# Patient Record
Sex: Female | Born: 1955 | Race: Black or African American | Hispanic: No | Smoking: Former smoker
Health system: Southern US, Community
[De-identification: ages and names within clinical notes are randomized; demographics above are authoritative.]

## PROBLEM LIST (undated history)

## (undated) DIAGNOSIS — E739 Lactose intolerance, unspecified: Secondary | ICD-10-CM

## (undated) DIAGNOSIS — E039 Hypothyroidism, unspecified: Secondary | ICD-10-CM

## (undated) DIAGNOSIS — M542 Cervicalgia: Secondary | ICD-10-CM

## (undated) DIAGNOSIS — J45909 Unspecified asthma, uncomplicated: Secondary | ICD-10-CM

## (undated) DIAGNOSIS — G4710 Hypersomnia, unspecified: Secondary | ICD-10-CM

## (undated) DIAGNOSIS — J309 Allergic rhinitis, unspecified: Secondary | ICD-10-CM

## (undated) DIAGNOSIS — E538 Deficiency of other specified B group vitamins: Secondary | ICD-10-CM

## (undated) DIAGNOSIS — K219 Gastro-esophageal reflux disease without esophagitis: Secondary | ICD-10-CM

## (undated) DIAGNOSIS — F329 Major depressive disorder, single episode, unspecified: Secondary | ICD-10-CM

## (undated) DIAGNOSIS — T7840XA Allergy, unspecified, initial encounter: Secondary | ICD-10-CM

## (undated) DIAGNOSIS — M2550 Pain in unspecified joint: Secondary | ICD-10-CM

## (undated) DIAGNOSIS — E119 Type 2 diabetes mellitus without complications: Secondary | ICD-10-CM

## (undated) DIAGNOSIS — M1990 Unspecified osteoarthritis, unspecified site: Secondary | ICD-10-CM

## (undated) DIAGNOSIS — E669 Obesity, unspecified: Secondary | ICD-10-CM

## (undated) DIAGNOSIS — M7062 Trochanteric bursitis, left hip: Secondary | ICD-10-CM

## (undated) DIAGNOSIS — C50919 Malignant neoplasm of unspecified site of unspecified female breast: Secondary | ICD-10-CM

## (undated) DIAGNOSIS — G4730 Sleep apnea, unspecified: Secondary | ICD-10-CM

## (undated) DIAGNOSIS — I10 Essential (primary) hypertension: Secondary | ICD-10-CM

## (undated) DIAGNOSIS — G709 Myoneural disorder, unspecified: Secondary | ICD-10-CM

## (undated) DIAGNOSIS — D649 Anemia, unspecified: Secondary | ICD-10-CM

## (undated) DIAGNOSIS — E559 Vitamin D deficiency, unspecified: Secondary | ICD-10-CM

## (undated) DIAGNOSIS — E785 Hyperlipidemia, unspecified: Secondary | ICD-10-CM

## (undated) DIAGNOSIS — D126 Benign neoplasm of colon, unspecified: Secondary | ICD-10-CM

## (undated) DIAGNOSIS — F411 Generalized anxiety disorder: Secondary | ICD-10-CM

## (undated) DIAGNOSIS — J984 Other disorders of lung: Secondary | ICD-10-CM

## (undated) DIAGNOSIS — K5900 Constipation, unspecified: Secondary | ICD-10-CM

## (undated) HISTORY — DX: Anemia, unspecified: D64.9

## (undated) HISTORY — DX: Allergy, unspecified, initial encounter: T78.40XA

## (undated) HISTORY — DX: Generalized anxiety disorder: F41.1

## (undated) HISTORY — DX: Other disorders of lung: J98.4

## (undated) HISTORY — DX: Unspecified osteoarthritis, unspecified site: M19.90

## (undated) HISTORY — DX: Deficiency of other specified B group vitamins: E53.8

## (undated) HISTORY — DX: Hyperlipidemia, unspecified: E78.5

## (undated) HISTORY — DX: Essential (primary) hypertension: I10

## (undated) HISTORY — DX: Lactose intolerance, unspecified: E73.9

## (undated) HISTORY — DX: Pain in unspecified joint: M25.50

## (undated) HISTORY — DX: Gastro-esophageal reflux disease without esophagitis: K21.9

## (undated) HISTORY — DX: Sleep apnea, unspecified: G47.30

## (undated) HISTORY — PX: ABDOMINAL HYSTERECTOMY: SHX81

## (undated) HISTORY — DX: Vitamin D deficiency, unspecified: E55.9

## (undated) HISTORY — DX: Hypersomnia, unspecified: G47.10

## (undated) HISTORY — DX: Cervicalgia: M54.2

## (undated) HISTORY — PX: WISDOM TOOTH EXTRACTION: SHX21

## (undated) HISTORY — DX: Obesity, unspecified: E66.9

## (undated) HISTORY — DX: Benign neoplasm of colon, unspecified: D12.6

## (undated) HISTORY — DX: Constipation, unspecified: K59.00

## (undated) HISTORY — DX: Major depressive disorder, single episode, unspecified: F32.9

## (undated) HISTORY — DX: Trochanteric bursitis, left hip: M70.62

## (undated) HISTORY — PX: BILATERAL OOPHORECTOMY: SHX1221

## (undated) HISTORY — PX: TUBAL LIGATION: SHX77

## (undated) HISTORY — PX: POLYPECTOMY: SHX149

## (undated) HISTORY — DX: Allergic rhinitis, unspecified: J30.9

## (undated) HISTORY — DX: Unspecified asthma, uncomplicated: J45.909

## (undated) HISTORY — PX: COLONOSCOPY: SHX174

## (undated) HISTORY — DX: Type 2 diabetes mellitus without complications: E11.9

## (undated) HISTORY — PX: OTHER SURGICAL HISTORY: SHX169

## (undated) HISTORY — DX: Myoneural disorder, unspecified: G70.9

---

## 2000-12-25 ENCOUNTER — Encounter: Admission: RE | Admit: 2000-12-25 | Discharge: 2001-01-03 | Payer: Self-pay | Admitting: Family Medicine

## 2001-02-26 ENCOUNTER — Other Ambulatory Visit: Admission: RE | Admit: 2001-02-26 | Discharge: 2001-02-26 | Payer: Self-pay | Admitting: Family Medicine

## 2001-10-05 ENCOUNTER — Encounter: Admission: RE | Admit: 2001-10-05 | Discharge: 2001-10-05 | Payer: Self-pay | Admitting: Family Medicine

## 2001-10-05 ENCOUNTER — Encounter: Payer: Self-pay | Admitting: Family Medicine

## 2002-05-16 ENCOUNTER — Ambulatory Visit (HOSPITAL_BASED_OUTPATIENT_CLINIC_OR_DEPARTMENT_OTHER): Admission: RE | Admit: 2002-05-16 | Discharge: 2002-05-16 | Payer: Self-pay | Admitting: Orthopedic Surgery

## 2003-01-02 ENCOUNTER — Other Ambulatory Visit: Admission: RE | Admit: 2003-01-02 | Discharge: 2003-01-02 | Payer: Self-pay | Admitting: Endocrinology

## 2003-01-07 ENCOUNTER — Encounter: Payer: Self-pay | Admitting: Endocrinology

## 2004-02-12 ENCOUNTER — Other Ambulatory Visit: Admission: RE | Admit: 2004-02-12 | Discharge: 2004-02-12 | Payer: Self-pay | Admitting: Endocrinology

## 2004-09-14 ENCOUNTER — Ambulatory Visit: Payer: Self-pay | Admitting: Internal Medicine

## 2004-09-22 ENCOUNTER — Ambulatory Visit (HOSPITAL_COMMUNITY): Admission: RE | Admit: 2004-09-22 | Discharge: 2004-09-22 | Payer: Self-pay | Admitting: Internal Medicine

## 2005-01-17 ENCOUNTER — Ambulatory Visit: Payer: Self-pay | Admitting: Internal Medicine

## 2005-02-16 ENCOUNTER — Ambulatory Visit: Payer: Self-pay | Admitting: Internal Medicine

## 2005-02-21 ENCOUNTER — Ambulatory Visit: Payer: Self-pay | Admitting: Internal Medicine

## 2005-08-11 ENCOUNTER — Ambulatory Visit: Payer: Self-pay | Admitting: Internal Medicine

## 2006-03-06 ENCOUNTER — Ambulatory Visit (HOSPITAL_COMMUNITY): Admission: RE | Admit: 2006-03-06 | Discharge: 2006-03-06 | Payer: Self-pay | Admitting: Obstetrics and Gynecology

## 2006-04-19 ENCOUNTER — Encounter (INDEPENDENT_AMBULATORY_CARE_PROVIDER_SITE_OTHER): Payer: Self-pay | Admitting: Specialist

## 2006-04-19 ENCOUNTER — Inpatient Hospital Stay (HOSPITAL_COMMUNITY): Admission: RE | Admit: 2006-04-19 | Discharge: 2006-04-21 | Payer: Self-pay | Admitting: Obstetrics and Gynecology

## 2006-04-28 ENCOUNTER — Ambulatory Visit: Payer: Self-pay | Admitting: Internal Medicine

## 2006-06-22 ENCOUNTER — Ambulatory Visit: Payer: Self-pay | Admitting: Internal Medicine

## 2006-06-22 LAB — CONVERTED CEMR LAB
ALT: 21 units/L (ref 0–40)
AST: 23 units/L (ref 0–37)
Albumin: 3.4 g/dL — ABNORMAL LOW (ref 3.5–5.2)
Alkaline Phosphatase: 56 units/L (ref 39–117)
BUN: 7 mg/dL (ref 6–23)
Bilirubin Urine: NEGATIVE
Calcium: 9.2 mg/dL (ref 8.4–10.5)
Chloride: 108 meq/L (ref 96–112)
Chol/HDL Ratio, serum: 4.5
Creatinine, Ser: 0.9 mg/dL (ref 0.4–1.2)
Glomerular Filtration Rate, Af Am: 86 mL/min/{1.73_m2}
Glucose, Bld: 102 mg/dL — ABNORMAL HIGH (ref 70–99)
Ketones, ur: NEGATIVE mg/dL
Lymphocytes Relative: 47.6 % — ABNORMAL HIGH (ref 12.0–46.0)
MCV: 75.4 fL — ABNORMAL LOW (ref 78.0–100.0)
Monocytes Relative: 8.1 % (ref 3.0–11.0)
Nitrite: NEGATIVE
Platelets: 227 10*3/uL (ref 150–400)
Potassium: 4.1 meq/L (ref 3.5–5.1)
Specific Gravity, Urine: >=1.03 (ref 1.000–1.03)
Total Protein: 6.9 g/dL (ref 6.0–8.3)
Triglyceride fasting, serum: 59 mg/dL (ref 0–149)
VLDL: 12 mg/dL (ref 0–40)

## 2006-07-05 ENCOUNTER — Encounter: Admission: RE | Admit: 2006-07-05 | Discharge: 2006-07-05 | Payer: Self-pay | Admitting: Obstetrics and Gynecology

## 2006-08-08 HISTORY — PX: KNEE ARTHROSCOPY: SUR90

## 2006-12-07 ENCOUNTER — Ambulatory Visit: Payer: Self-pay | Admitting: Internal Medicine

## 2007-02-07 ENCOUNTER — Ambulatory Visit (HOSPITAL_BASED_OUTPATIENT_CLINIC_OR_DEPARTMENT_OTHER): Admission: RE | Admit: 2007-02-07 | Discharge: 2007-02-07 | Payer: Self-pay | Admitting: Orthopedic Surgery

## 2007-04-03 ENCOUNTER — Encounter: Payer: Self-pay | Admitting: Endocrinology

## 2007-04-03 DIAGNOSIS — F411 Generalized anxiety disorder: Secondary | ICD-10-CM

## 2007-04-03 DIAGNOSIS — F3289 Other specified depressive episodes: Secondary | ICD-10-CM

## 2007-04-03 DIAGNOSIS — F329 Major depressive disorder, single episode, unspecified: Secondary | ICD-10-CM

## 2007-04-03 DIAGNOSIS — E785 Hyperlipidemia, unspecified: Secondary | ICD-10-CM

## 2007-04-03 HISTORY — DX: Generalized anxiety disorder: F41.1

## 2007-04-03 HISTORY — DX: Hyperlipidemia, unspecified: E78.5

## 2007-04-03 HISTORY — DX: Other specified depressive episodes: F32.89

## 2007-04-03 HISTORY — DX: Major depressive disorder, single episode, unspecified: F32.9

## 2007-08-21 ENCOUNTER — Ambulatory Visit: Payer: Self-pay | Admitting: Internal Medicine

## 2007-08-21 DIAGNOSIS — J45909 Unspecified asthma, uncomplicated: Secondary | ICD-10-CM

## 2007-08-21 DIAGNOSIS — E119 Type 2 diabetes mellitus without complications: Secondary | ICD-10-CM | POA: Insufficient documentation

## 2007-08-21 DIAGNOSIS — J309 Allergic rhinitis, unspecified: Secondary | ICD-10-CM | POA: Insufficient documentation

## 2007-08-21 DIAGNOSIS — E739 Lactose intolerance, unspecified: Secondary | ICD-10-CM

## 2007-08-21 DIAGNOSIS — D649 Anemia, unspecified: Secondary | ICD-10-CM

## 2007-08-21 HISTORY — DX: Allergic rhinitis, unspecified: J30.9

## 2007-08-21 HISTORY — DX: Unspecified asthma, uncomplicated: J45.909

## 2007-08-21 HISTORY — DX: Lactose intolerance, unspecified: E73.9

## 2007-08-21 HISTORY — DX: Anemia, unspecified: D64.9

## 2007-10-05 ENCOUNTER — Encounter: Payer: Self-pay | Admitting: Internal Medicine

## 2008-04-03 ENCOUNTER — Ambulatory Visit: Payer: Self-pay | Admitting: Internal Medicine

## 2008-04-03 DIAGNOSIS — R5383 Other fatigue: Secondary | ICD-10-CM

## 2008-04-07 LAB — CONVERTED CEMR LAB
Albumin: 3.5 g/dL (ref 3.5–5.2)
Alkaline Phosphatase: 49 units/L (ref 39–117)
BUN: 11 mg/dL (ref 6–23)
Basophils Absolute: 0 10*3/uL (ref 0.0–0.1)
Basophils Relative: 0.5 % (ref 0.0–3.0)
CO2: 28 meq/L (ref 19–32)
Cholesterol: 180 mg/dL (ref 0–200)
Creatinine, Ser: 0.9 mg/dL (ref 0.4–1.2)
GFR calc Af Amer: 85 mL/min
GFR calc non Af Amer: 70 mL/min
Glucose, Bld: 101 mg/dL — ABNORMAL HIGH (ref 70–99)
HCT: 34.8 % — ABNORMAL LOW (ref 36.0–46.0)
MCV: 74.2 fL — ABNORMAL LOW (ref 78.0–100.0)
Monocytes Absolute: 0.6 10*3/uL (ref 0.1–1.0)
Monocytes Relative: 6.8 % (ref 3.0–12.0)
Potassium: 3.7 meq/L (ref 3.5–5.1)
RBC: 4.69 M/uL (ref 3.87–5.11)
RDW: 13.8 % (ref 11.5–14.6)
Total Bilirubin: 0.5 mg/dL (ref 0.3–1.2)
VLDL: 26 mg/dL (ref 0–40)

## 2008-04-18 ENCOUNTER — Encounter: Payer: Self-pay | Admitting: Internal Medicine

## 2008-06-20 ENCOUNTER — Telehealth (INDEPENDENT_AMBULATORY_CARE_PROVIDER_SITE_OTHER): Payer: Self-pay | Admitting: *Deleted

## 2008-10-27 ENCOUNTER — Ambulatory Visit: Payer: Self-pay | Admitting: Internal Medicine

## 2008-10-27 DIAGNOSIS — N390 Urinary tract infection, site not specified: Secondary | ICD-10-CM

## 2008-10-27 DIAGNOSIS — I10 Essential (primary) hypertension: Secondary | ICD-10-CM | POA: Insufficient documentation

## 2008-10-27 LAB — CONVERTED CEMR LAB
Bilirubin Urine: NEGATIVE
Ketones, urine, test strip: NEGATIVE
Nitrite: NEGATIVE
pH: 5

## 2008-11-26 ENCOUNTER — Ambulatory Visit: Payer: Self-pay | Admitting: Internal Medicine

## 2008-12-05 ENCOUNTER — Ambulatory Visit: Payer: Self-pay | Admitting: Pulmonary Disease

## 2008-12-05 DIAGNOSIS — G4730 Sleep apnea, unspecified: Secondary | ICD-10-CM

## 2008-12-05 DIAGNOSIS — G4710 Hypersomnia, unspecified: Secondary | ICD-10-CM

## 2008-12-05 HISTORY — DX: Hypersomnia, unspecified: G47.10

## 2008-12-23 ENCOUNTER — Telehealth (INDEPENDENT_AMBULATORY_CARE_PROVIDER_SITE_OTHER): Payer: Self-pay | Admitting: *Deleted

## 2008-12-24 ENCOUNTER — Ambulatory Visit (HOSPITAL_BASED_OUTPATIENT_CLINIC_OR_DEPARTMENT_OTHER): Admission: RE | Admit: 2008-12-24 | Discharge: 2008-12-24 | Payer: Self-pay | Admitting: Pulmonary Disease

## 2008-12-24 ENCOUNTER — Encounter: Payer: Self-pay | Admitting: Pulmonary Disease

## 2008-12-30 ENCOUNTER — Ambulatory Visit: Payer: Self-pay | Admitting: Pulmonary Disease

## 2009-01-06 ENCOUNTER — Encounter: Payer: Self-pay | Admitting: Pulmonary Disease

## 2009-01-09 ENCOUNTER — Ambulatory Visit: Payer: Self-pay | Admitting: Pulmonary Disease

## 2009-01-22 ENCOUNTER — Encounter: Admission: RE | Admit: 2009-01-22 | Discharge: 2009-01-22 | Payer: Self-pay | Admitting: Pulmonary Disease

## 2009-03-04 ENCOUNTER — Ambulatory Visit: Payer: Self-pay | Admitting: Internal Medicine

## 2009-03-04 DIAGNOSIS — R1319 Other dysphagia: Secondary | ICD-10-CM

## 2009-03-04 LAB — CONVERTED CEMR LAB
ALT: 20 units/L (ref 0–35)
AST: 24 units/L (ref 0–37)
Albumin: 3.8 g/dL (ref 3.5–5.2)
BUN: 13 mg/dL (ref 6–23)
Bilirubin Urine: NEGATIVE
Chloride: 109 meq/L (ref 96–112)
Cholesterol: 171 mg/dL (ref 0–200)
Hemoglobin: 11.9 g/dL — ABNORMAL LOW (ref 12.0–15.0)
Hgb A1c MFr Bld: 5.8 % (ref 4.6–6.5)
LDL Cholesterol: 123 mg/dL — ABNORMAL HIGH (ref 0–99)
Leukocytes, UA: NEGATIVE
Lymphs Abs: 2.9 10*3/uL (ref 0.7–4.0)
MCHC: 31.7 g/dL (ref 30.0–36.0)
MCV: 74.7 fL — ABNORMAL LOW (ref 78.0–100.0)
Neutrophils Relative %: 36.4 % — ABNORMAL LOW (ref 43.0–77.0)
Nitrite: NEGATIVE
Potassium: 4.1 meq/L (ref 3.5–5.1)
RDW: 13.9 % (ref 11.5–14.6)
Sodium: 143 meq/L (ref 135–145)
TSH: 2.73 microintl units/mL (ref 0.35–5.50)
Total CHOL/HDL Ratio: 5
Total Protein, Urine: NEGATIVE mg/dL
Triglycerides: 60 mg/dL (ref 0.0–149.0)
VLDL: 12 mg/dL (ref 0.0–40.0)

## 2009-03-06 LAB — CONVERTED CEMR LAB: Vit D, 25-Hydroxy: 13 ng/mL — ABNORMAL LOW (ref 30–89)

## 2009-03-18 ENCOUNTER — Encounter: Payer: Self-pay | Admitting: Internal Medicine

## 2009-03-19 ENCOUNTER — Encounter: Payer: Self-pay | Admitting: Internal Medicine

## 2009-06-26 ENCOUNTER — Ambulatory Visit (HOSPITAL_COMMUNITY): Admission: RE | Admit: 2009-06-26 | Discharge: 2009-06-28 | Payer: Self-pay | Admitting: Orthopedic Surgery

## 2010-03-11 ENCOUNTER — Telehealth: Payer: Self-pay | Admitting: Internal Medicine

## 2010-04-05 ENCOUNTER — Inpatient Hospital Stay (HOSPITAL_COMMUNITY): Admission: EM | Admit: 2010-04-05 | Discharge: 2010-04-06 | Payer: Self-pay | Admitting: Emergency Medicine

## 2010-04-07 ENCOUNTER — Ambulatory Visit: Payer: Self-pay | Admitting: Internal Medicine

## 2010-04-07 DIAGNOSIS — R1011 Right upper quadrant pain: Secondary | ICD-10-CM

## 2010-04-07 DIAGNOSIS — K5289 Other specified noninfective gastroenteritis and colitis: Secondary | ICD-10-CM

## 2010-04-07 DIAGNOSIS — J984 Other disorders of lung: Secondary | ICD-10-CM

## 2010-04-07 HISTORY — DX: Other disorders of lung: J98.4

## 2010-04-17 ENCOUNTER — Emergency Department (HOSPITAL_COMMUNITY): Admission: EM | Admit: 2010-04-17 | Discharge: 2010-04-17 | Payer: Self-pay | Admitting: Emergency Medicine

## 2010-05-07 ENCOUNTER — Ambulatory Visit (HOSPITAL_COMMUNITY): Admission: RE | Admit: 2010-05-07 | Discharge: 2010-05-07 | Payer: Self-pay | Admitting: Internal Medicine

## 2010-05-18 ENCOUNTER — Telehealth: Payer: Self-pay | Admitting: Internal Medicine

## 2010-08-29 ENCOUNTER — Encounter: Payer: Self-pay | Admitting: Neurology
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
## 2010-09-07 NOTE — Progress Notes (Signed)
Summary: Rx refill req  Phone Note Call from Patient Call back at Work Phone [phone]   Caller: Patient Summary of Call: Pt called requesting refill of Naproxen and Flexeril for Arthritis back pain.  [facility] [address] Initial call taken by: Margaret Pyle, CMA,  May 18, 2010 2:57 PM  Follow-up for Phone Call        ok - to robin to handle Follow-up by: Corwin Levins MD,  May 18, 2010 5:19 PM  Additional Follow-up for Phone Call Additional follow up Details #1::        I can do but did not see flexeril on med. list? Additional Follow-up by: Robin Ewing CMA Duncan Dull),  May 19, 2010 9:00 AM    Additional Follow-up for Phone Call Additional follow up Details #2::    ok for flexeril 5 three times a day as needed, #90, x 1 ref Follow-up by: Corwin Levins MD,  May 19, 2010 9:41 AM  New/Updated Medications: FLEXERIL 5 MG TABS (CYCLOBENZAPRINE HCL) 1 by mouth three times a day Prescriptions: FLEXERIL 5 MG TABS (CYCLOBENZAPRINE HCL) 1 by mouth three times a day  #90 x 1   Entered by:   Scharlene Gloss CMA (AAMA)   Authorized by:   Corwin Levins MD   Signed by:   Scharlene Gloss CMA (AAMA) on 05/19/2010   Method used:   [facility]ed to ...       [facility] [phone][facility] (retail)       [address]       [place], [place]  [id number]       Ph: [phone] or [phone]       Fax: [phone]   RxID:   [id number] NAPROSYN 500 MG TABS (NAPROXEN) Take 1 tablet by mouth twice a day as needed pain  #60 x 1   Entered by:   Scharlene Gloss CMA (AAMA)   Authorized by:   Corwin Levins MD   Signed by:   Scharlene Gloss CMA (AAMA) on 05/19/2010   Method used:   [facility]ed to ...       [facility] [phone][facility] ([facility]ail)       [address]       [place], [place]  [id number]       Ph: [phone] or [phone]       Fax: [phone]   RxID:   [id number]

## 2010-09-07 NOTE — Progress Notes (Signed)
Summary: Medicatioon Refill  Phone Note Refill Request Message from:  Fax from Pharmacy on March 11, 2010 9:19 AM  Refills Requested: Medication #1:  ALPRAZOLAM 0.25 MG TABS 1 by mouth two times a day as needed   Dosage confirmed as above?Dosage Confirmed   Last Refilled: 03/04/2009   Notes[facility] [address], [phone] Initial call taken by: Robin Ewing CMA Duncan Dull),  March 11, 2010 9:19 AM    New/Updated Medications: ALPRAZOLAM 0.25 MG TABS (ALPRAZOLAM) 1 by mouth two times a day as needed Prescriptions: ALPRAZOLAM 0.25 MG TABS (ALPRAZOLAM) 1 by mouth two times a day as needed  #60 x 2   Entered and Authorized by:   Corwin Levins MD   Signed by:   Corwin Levins MD on 03/11/2010   Method used:   Print then Give to Patient   RxID:   [id number]  done hardcopy to LIM side B - dahlia Corwin Levins MD  March 11, 2010 12:40 PM   Rx faxed to pharmacy Margaret Pyle, CMA  March 11, 2010 1:06 PM

## 2010-10-03 ENCOUNTER — Emergency Department (HOSPITAL_COMMUNITY)
Admission: EM | Admit: 2010-10-03 | Discharge: 2010-10-03 | Disposition: A | Discharge status: Home / Self Care | Payer: 59 | Attending: Emergency Medicine | Admitting: Emergency Medicine

## 2010-10-03 DIAGNOSIS — R51 Headache: Secondary | ICD-10-CM | POA: Insufficient documentation

## 2010-10-03 DIAGNOSIS — J329 Chronic sinusitis, unspecified: Secondary | ICD-10-CM | POA: Insufficient documentation

## 2010-10-06 ENCOUNTER — Encounter: Payer: Self-pay | Admitting: Internal Medicine

## 2010-10-07 ENCOUNTER — Other Ambulatory Visit: Payer: Self-pay | Admitting: Internal Medicine

## 2010-10-07 DIAGNOSIS — R911 Solitary pulmonary nodule: Secondary | ICD-10-CM

## 2010-10-11 ENCOUNTER — Ambulatory Visit (INDEPENDENT_AMBULATORY_CARE_PROVIDER_SITE_OTHER)
Admission: RE | Admit: 2010-10-11 | Discharge: 2010-10-11 | Disposition: A | Discharge status: Home / Self Care | Payer: 59 | Source: Ambulatory Visit | Attending: Internal Medicine | Admitting: Internal Medicine

## 2010-10-11 DIAGNOSIS — R911 Solitary pulmonary nodule: Secondary | ICD-10-CM

## 2010-10-11 DIAGNOSIS — J984 Other disorders of lung: Secondary | ICD-10-CM

## 2010-10-14 NOTE — Miscellaneous (Signed)
Summary: Orders Update   Clinical Lists Changes  Orders: Added new Referral order of Radiology Referral (Radiology) - Signed 

## 2010-10-21 LAB — URINALYSIS, ROUTINE W REFLEX MICROSCOPIC
Bilirubin Urine: NEGATIVE
Glucose, UA: NEGATIVE mg/dL
Hgb urine dipstick: NEGATIVE
Ketones, ur: NEGATIVE mg/dL
Nitrite: NEGATIVE
Protein, ur: NEGATIVE mg/dL
Specific Gravity, Urine: 1.019 (ref 1.005–1.030)
Urobilinogen, UA: 0.2 mg/dL (ref 0.0–1.0)
pH: 5 (ref 5.0–8.0)

## 2010-10-22 LAB — CK TOTAL AND CKMB (NOT AT ARMC)
Relative Index: 1 (ref 0.0–2.5)
Total CK: 239 U/L — ABNORMAL HIGH (ref 7–177)

## 2010-10-22 LAB — COMPREHENSIVE METABOLIC PANEL
ALT: 16 U/L (ref 0–35)
AST: 18 U/L (ref 0–37)
Albumin: 3.6 g/dL (ref 3.5–5.2)
CO2: 28 mEq/L (ref 19–32)
CO2: 29 mEq/L (ref 19–32)
Calcium: 8.7 mg/dL (ref 8.4–10.5)
Chloride: 107 mEq/L (ref 96–112)
GFR calc Af Amer: >60 mL/min (ref >60)
GFR calc Af Amer: >60 mL/min (ref >60)
GFR calc non Af Amer: 56 mL/min — ABNORMAL LOW (ref >60)
GFR calc non Af Amer: >60 mL/min (ref >60)
Glucose, Bld: 118 mg/dL — ABNORMAL HIGH (ref 70–99)
Potassium: 3.9 mEq/L (ref 3.5–5.1)
Sodium: 138 mEq/L (ref 135–145)
Sodium: 141 mEq/L (ref 135–145)
Total Bilirubin: 0.5 mg/dL (ref 0.3–1.2)

## 2010-10-22 LAB — LIPASE, BLOOD: Lipase: 29 U/L (ref 11–59)

## 2010-10-22 LAB — POCT CARDIAC MARKERS
CKMB, poc: 1.2 ng/mL (ref 1.0–8.0)
CKMB, poc: 1.4 ng/mL (ref 1.0–8.0)
Myoglobin, poc: 61.7 ng/mL (ref 12–200)
Troponin i, poc: <0.05 ng/mL (ref 0.00–0.09)

## 2010-10-22 LAB — URINALYSIS, ROUTINE W REFLEX MICROSCOPIC
Bilirubin Urine: NEGATIVE
Glucose, UA: NEGATIVE mg/dL
Nitrite: NEGATIVE
Protein, ur: NEGATIVE mg/dL

## 2010-10-22 LAB — TROPONIN I: Troponin I: 0.01 ng/mL (ref 0.00–0.06)

## 2010-10-22 LAB — DIFFERENTIAL
Basophils Relative: 1 % (ref 0–1)
Monocytes Absolute: 0.5 10*3/uL (ref 0.1–1.0)
Monocytes Relative: 8 % (ref 3–12)

## 2010-10-22 LAB — CBC
Hemoglobin: 10.6 g/dL — ABNORMAL LOW (ref 12.0–15.0)
Hemoglobin: 11 g/dL — ABNORMAL LOW (ref 12.0–15.0)
MCHC: 31.9 g/dL (ref 30.0–36.0)
Platelets: 190 10*3/uL (ref 150–400)
RBC: 4.46 MIL/uL (ref 3.87–5.11)
RBC: 4.62 MIL/uL (ref 3.87–5.11)

## 2010-11-10 LAB — PROTIME-INR
INR: 1.11 (ref 0.00–1.49)
Prothrombin Time: 14.2 seconds (ref 11.6–15.2)

## 2010-11-10 LAB — DIFFERENTIAL
Basophils Absolute: 0 10*3/uL (ref 0.0–0.1)
Basophils Relative: 0 % (ref 0–1)
Monocytes Relative: 5 % (ref 3–12)
Neutro Abs: 1.8 10*3/uL (ref 1.7–7.7)
Neutrophils Relative %: 28 % — ABNORMAL LOW (ref 43–77)

## 2010-11-10 LAB — APTT: aPTT: 25 seconds (ref 24–37)

## 2010-11-10 LAB — CBC
HCT: 35.7 % — ABNORMAL LOW (ref 36.0–46.0)
Hemoglobin: 11.4 g/dL — ABNORMAL LOW (ref 12.0–15.0)
RDW: 14.4 % (ref 11.5–15.5)

## 2010-11-10 LAB — COMPREHENSIVE METABOLIC PANEL
Alkaline Phosphatase: 46 U/L (ref 39–117)
BUN: 12 mg/dL (ref 6–23)
CO2: 29 mEq/L (ref 19–32)
GFR calc non Af Amer: >60 mL/min (ref >60)
Glucose, Bld: 83 mg/dL (ref 70–99)
Potassium: 3.9 mEq/L (ref 3.5–5.1)
Total Bilirubin: 0.7 mg/dL (ref 0.3–1.2)
Total Protein: 6.9 g/dL (ref 6.0–8.3)

## 2010-11-10 LAB — URINALYSIS, ROUTINE W REFLEX MICROSCOPIC
Bilirubin Urine: NEGATIVE
Glucose, UA: NEGATIVE mg/dL
Ketones, ur: NEGATIVE mg/dL
pH: 5 (ref 5.0–8.0)

## 2010-12-01 ENCOUNTER — Emergency Department (HOSPITAL_COMMUNITY): Payer: 59

## 2010-12-01 ENCOUNTER — Emergency Department (HOSPITAL_COMMUNITY)
Admission: EM | Admit: 2010-12-01 | Discharge: 2010-12-02 | Disposition: A | Discharge status: Home / Self Care | Payer: 59 | Attending: General Surgery | Admitting: General Surgery

## 2010-12-01 DIAGNOSIS — M549 Dorsalgia, unspecified: Secondary | ICD-10-CM | POA: Insufficient documentation

## 2010-12-01 DIAGNOSIS — S41109A Unspecified open wound of unspecified upper arm, initial encounter: Secondary | ICD-10-CM | POA: Insufficient documentation

## 2010-12-01 DIAGNOSIS — S21209A Unspecified open wound of unspecified back wall of thorax without penetration into thoracic cavity, initial encounter: Secondary | ICD-10-CM | POA: Insufficient documentation

## 2010-12-01 DIAGNOSIS — K219 Gastro-esophageal reflux disease without esophagitis: Secondary | ICD-10-CM | POA: Insufficient documentation

## 2010-12-01 DIAGNOSIS — R112 Nausea with vomiting, unspecified: Secondary | ICD-10-CM | POA: Insufficient documentation

## 2010-12-01 DIAGNOSIS — S21109A Unspecified open wound of unspecified front wall of thorax without penetration into thoracic cavity, initial encounter: Secondary | ICD-10-CM | POA: Insufficient documentation

## 2010-12-01 DIAGNOSIS — S01409A Unspecified open wound of unspecified cheek and temporomandibular area, initial encounter: Secondary | ICD-10-CM | POA: Insufficient documentation

## 2010-12-01 DIAGNOSIS — G4730 Sleep apnea, unspecified: Secondary | ICD-10-CM | POA: Insufficient documentation

## 2010-12-01 DIAGNOSIS — Y92009 Unspecified place in unspecified non-institutional (private) residence as the place of occurrence of the external cause: Secondary | ICD-10-CM | POA: Insufficient documentation

## 2010-12-01 DIAGNOSIS — S1190XA Unspecified open wound of unspecified part of neck, initial encounter: Secondary | ICD-10-CM | POA: Insufficient documentation

## 2010-12-01 DIAGNOSIS — S51809A Unspecified open wound of unspecified forearm, initial encounter: Secondary | ICD-10-CM | POA: Insufficient documentation

## 2010-12-01 DIAGNOSIS — R079 Chest pain, unspecified: Secondary | ICD-10-CM | POA: Insufficient documentation

## 2010-12-01 DIAGNOSIS — M542 Cervicalgia: Secondary | ICD-10-CM | POA: Insufficient documentation

## 2010-12-01 LAB — TYPE AND SCREEN: Unit division: 0

## 2010-12-01 LAB — POCT I-STAT, CHEM 8
BUN: 17 mg/dL (ref 6–23)
Creatinine, Ser: 1.1 mg/dL (ref 0.4–1.2)
Hemoglobin: 11.6 g/dL — ABNORMAL LOW (ref 12.0–15.0)
Potassium: 3.7 meq/L (ref 3.5–5.1)
Sodium: 137 meq/L (ref 135–145)

## 2010-12-01 LAB — ABO/RH: ABO/RH(D): O POS

## 2010-12-01 MED ORDER — IOHEXOL 300 MG/ML  SOLN
100.0000 mL | Freq: Once | INTRAMUSCULAR | Status: AC | PRN
  Administered 2010-12-01: 100 mL via INTRAVENOUS
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
## 2010-12-21 NOTE — Op Note (Signed)
NAME:  Elizabeth Hawkins, Elizabeth Hawkins          ACCOUNT NO.:  [id number]   MEDICAL RECORD NO.:  [id number]          PATIENT TYPE:  AMB   LOCATION:  [facility]                          FACILITY:  [facility]   PHYSICIAN:  Harvie Junior, M.D.   DATE OF BIRTH:  01-24-1956   DATE OF PROCEDURE:  02/07/2007  DATE OF DISCHARGE:                               OPERATIVE REPORT   PREOPERATIVE DIAGNOSIS:  Medial meniscal tear.   POSTOPERATIVE DIAGNOSES:  1. Medial meniscal tear.  2. With chondromalacia, medial femoral condyle; and chondromalacia,      patellofemoral joint.   PROCEDURE PERFORMED:  1. Partial posterior horn medial meniscectomy with corresponding      debridement in the medial compartment.  2. Debridement of chondromalacia patella.   SURGEON:  Harvie Junior, M.D.   ASSISTANT SURGEON:  Marshia Ly, P.A.   ANESTHESIA:  General.   BRIEF HISTORY:  [name] is a 55 year old female with a history  of having had medial side knee pain.  She has had locking, catching.  We  talked about treatment options and ultimately felt the most appropriate  course of action for her was going to be arthroscopic medial  meniscectomy and she was brought to the operating room for this  procedure.   DESCRIPTION OF PROCEDURE:  The patient was brought to the operating room  and, after adequate anesthesia was obtained with a general anesthetic,  the patient was placed on the operating table and the left leg was  prepped and draped in the sterile fashion.  Following this, routine  arthroscopic examination of the knee revealed that there was an obvious  posterior horn medial meniscal tear.  This was debrided with straight  biting forceps, upbiting forceps, and the remaining meniscal rim was  contoured down with a Leksell shaver.   There was some chondromalacia in the medial compartment.  It was not  dramatic, but certainly grade II changes on the medial femoral condyle,  and grade I change on the medial tibial  plateau.  We turned to the ACL  which looked normal.  Turned to the lateral side which was normal.  The  patellofemoral joint showed some grade II chondromalacia and no dramatic  lateral tracking.  At this point the chondromalacia was debrided up in  the patellofemoral joint, especially in the lateral facet.  At this  point the knee was copiously and thoroughly irrigated  and suctioned dry.  The operative portals were closed with a bandage.  A  sterile compressive dressing was applied, and the patient was taken to  the recovery room and was noted to be in satisfactory condition.  Estimated blood loss for the procedure was none.      Harvie Junior, M.D.  Electronically Signed     JLG/MEDQ  D:  02/07/2007  T:  02/07/2007  Job:  [id number]

## 2010-12-21 NOTE — Procedures (Signed)
NAME:  Elizabeth Hawkins, Elizabeth Hawkins                ACCOUNT NO.:  [id number]   MEDICAL RECORD NO.:  [id number]          PATIENT TYPE:  OUT   LOCATION:  SLEEP CENTER                 FACILITY:  [facility]   PHYSICIAN:  Oretha Milch, MD      DATE OF BIRTH:  10-02-1955   DATE OF STUDY:                            NOCTURNAL POLYSOMNOGRAM   REFERRING PHYSICIAN:   INDICATION FOR STUDY:  Excessive daytime somnolence, fatigue,  nonrefreshing sleep, and loud snoring in this 55 year old woman with a  weight of 230 pounds, height 5 feet 4 inches, BMI 39, neck size 17  inches.   EPWORTH SLEEPINESS SCORE:  14.   This intervention polysomnogram was performed with a sleep technologist  in attendance.  EEG, EOG, EMG, EKG, and respiratory parameters were  recorded.  Sleep stages, arousals, limb movements, and respiratory data  were scored according to criteria laid out by the American Academy of  Sleep Medicine.   MEDICATIONS:   SLEEP ARCHITECTURE:  Lights out was at 10:46 p.m., lights on was at work  5:49 a.m.  CPAP was initiated at 1:00 a.m.  During the diagnostic  portion, total sleep time was 165 minutes with a sleep period time of  172 minutes and a sleep efficiency of 88%.  Sleep latency was 23 minutes  with a latency of REM sleep 157 minutes.  Sleep stages as a percentage  of total sleep time was N1 13%, N2 88%, N3 0.3%, and REM sleep 88.8%  (14.5 minutes).  She spent 105 minutes in the supine position during the  titration portion of REM sleep accounted for 59 minutes.  REM was noted  in 3 long periods during the night.   RESPIRATORY DATA:  During the diagnostic portion, there were 70  obstructive apneas, 0 central apneas, 0 mixed apneas, and 28 hypopnea  with apnea-hypopnea index of 16 events per hour.  The longest apnea was  18 seconds and longest hypopnea was 33 seconds and the lowest  desaturation was 78%.  Due to this degree of respiratory disturbance,  CPAP was initiated at 5 cm and titrated due to  hypopnea and snoring to a  final level of 13 cm at a level of 10 cm for 45.5 minutes including 6  hours of REM sleep.  No events were noted with a low desaturation of  92%.  Due to snoring, CPAP was titrated further at a final level of 13  cm for 24.5 minutes including 2.5 minutes of REM sleep.  No events were  noted with a low desaturation of 94%.  This appears to be optimal level  used during the study.   AROUSAL DATA:  The arousal index during the diagnostic portion was 18  events per hour.  Few arousals were noted at the higher levels of CPAP.   OXYGEN DATA:  The lowest desaturation during the diagnostic portion was  78%.  No desaturation was noted at the higher levels of the CPAP.   CARDIAC DATA:  No arrhythmias were noted.  The low heart rate was 47  beats per minute during non-REM sleep and the high heart rate was 111  beats per  minute.   MOVEMENT-PARASOMNIA:  No significant limb movements were noted.   DISCUSSION:  A medium Mirage Quattro mask was used for desensitization.  Supine REM sleep was noted during the titration portion.  Titration  appeared to be optimal.   IMPRESSIONS:  1. Moderate obstructive sleep apnea with hypopnea causing sleep      fragmentation and desaturation.  2. This was corrected by continuous positive airway pressure of 10 cm      but was titrated optimally at 13 cm to absence of snoring with the      medium full-face mask.  3. No evidence of cardiac arrhythmias, limb movements, or behavioral      disturbance during sleep.   RECOMMENDATIONS:  1. CPAP should be initiated at 10 cm with a goal of 13 cm with a      medium full-face mask.  2. Weight loss should be encouraged.  3. She should be asked to avoid medications with sedative side      effects.  She should be asked to avoid driving when sleepy.      Oretha Milch, MD  Electronically Signed     RVA/MEDQ  D:  12/30/2008 12:43:59  T:  12/31/2008 03:35:53  Job:  [id number]

## 2010-12-24 NOTE — Op Note (Signed)
NAME:  Elizabeth Hawkins, Elizabeth Hawkins          ACCOUNT NO.:  [id number]   MEDICAL RECORD NO.:  [id number]          PATIENT TYPE:  INP   LOCATION:  9311                          FACILITY:  [facility]   PHYSICIAN:  Duke Salvia. Marcelle Overlie, M.D.DATE OF BIRTH:  [birth year]   DATE OF PROCEDURE:  04/19/2006  DATE OF DISCHARGE:                                 OPERATIVE REPORT   PREOPERATIVE DIAGNOSIS:  Pelvic pain, symptomatic degenerating leiomyoma.  Umbilica hernia with omentum.   POSTOPERATIVE DIAGNOSIS:  Pelvic pain, symptomatic degenerating leiomyoma.  Umbilica hernia with omentum.   PROCEDURE:  Attempted open diagnostic laparoscopy followed by total  abdominal hysterectomy, bilateral salpingo-oophorectomy, lysis of adhesions.   SURGEON:  Duke Salvia. Marcelle Overlie, M.D.   ASSISTANT:  Zelphia Cairo, M.D.   ANESTHESIA:  General endotracheal.   DRAINS:  Foley catheter.   BLOOD LOSS:  150.   SPECIMENS REMOVED:  Bilateral tubes and ovaries and uterus.   PROCEDURE AND FINDINGS:  The patient taken to the operating room.  After an  adequate level of general endotracheal anesthesia was obtained with the  patient's legs in stirrups, the abdomen, perineum and vagina were prepped in  the usual manner for LAVH/BSO.  A Foley catheter was positioned.  A Hulka  tenaculum was positioned.  Attention directed to the subumbilical area which  was infiltrated with half percent Marcaine plain.  An incision was made,  carried down individually through fascia using deep retractors.  I could  never get directly into the peritoneum.  Because of concerns about poor  descent noted during her EUA and the presence of a periumbilical hernia and  difficulty getting in, decision made to proceed with TAH-BSO.  Pfannenstiel  incision was made through her old C-section scar, carried down to the fascia  which was incised and extended transversely.  Rectus muscles divided in the  midline.  Peritoneum entered superiorly without incident and  extended in a  vertical fashion.  O'Connor-O'Sullivan retractor was then positioned.  Palpation in the periumbilical area revealed a large umbilical hernia above  the umbilicus.  There was no omentum or bowel into that area, but there were  some periumbilical adhesions from the omentum.  The bowel was packed  superiorly out of the field.  The patient was placed in Trendelenburg.  Kelly clamps were then placed at each utero-ovarian pedicle starting on the  left.  The LigaSure was used to coagulate, cut and divide the left round  ligament.  Peritoneum carried around the midline.  The exact same repeated  on the opposite side, and the bladder was advanced below the cervix with  sharp and blunt dissection.  The left IP ligament was then clamped, divided,  doubly free tied with 0 Vicryl suture after carefully identifying the course  of the ureter well below.  The exact same repeated on the opposite side,  again after carefully identifying the ureter on the opposite side below our  surgical site.  The uterine vascular pedicles were then skeletonized.  After  assuring that the bladder was well below, the uterine vascular pedicles were  coagulated with LigaSure and divided.  This was carried down  close to the  uterus through the cardinal ligament.  Heaney clamps were then used to clamp  the cervical vaginal pedicles, and the specimen was excised.  These were  closed in Heaney fashion with 0 Vicryl sutures.  The remainder of the cuff  was closed with interrupted 2-0 Vicryl sutures.  Pelvis was irrigated with  saline and aspirated.  The operative site was noted to be hemostatic.  The  cecum and appendix were inspected and noted to be normal.  There was not  good enough exposure to the Pfannenstiel to fix the umbilical hernia which  will require general surgical follow-up if symptomatic.  Prior to closure,  sponge, needle and instrument counts were correct x2.  Perineum closed with  a running 2-0  Vicryl suture.  Fascia closed from laterally to midline on  either side with a 0 PDS suture.  The subfascial and subcutaneous On-Q  catheter was then positioned.  Subcutaneous fat was hemostatic.  Steri-  Strips were used on the skin.  She tolerated this well and went to recovery  room in good condition.  Clear urine noted at the end of the case.      Richard M. Marcelle Overlie, M.D.  Electronically Signed     RMH/MEDQ  D:  04/19/2006  T:  04/19/2006  Job:  [id number]

## 2010-12-24 NOTE — H&P (Signed)
Elizabeth Hawkins, Elizabeth Hawkins                ACCOUNT NO.:  [id number]   MEDICAL RECORD NO.:  [id number]          PATIENT TYPE:  AMB   LOCATION:                                [facility]  [facility]   PHYSICIAN:  Duke Salvia. Marcelle Overlie, M.D.    DATE OF BIRTH:   DATE OF ADMISSION:  04/19/2006  DATE OF DISCHARGE:                                HISTORY & PHYSICAL   CHIEF COMPLAINT:  Pelvic pain secondary to degenerating leiomyoma   HISTORY OF PRESENT ILLNESS:  A 55 year old G3, P1, prior tubal.  The patient  underwent endometrial ablation in 1992 with significant hypomenorrhea.  In  1999, she had some spotting.  Endometrial biopsy was performed, very minimal  tissue noted that returned weak proliferative activity, no other  abnormality, just a few fragments.  More recently, she has been bothered by  worsening pelvic pain in the last 6 months.  Ultrasound done recently here  in the office showed a 4.3 x 3.5 cm fibroid that appeared to have some  central cavitation consistent with degeneration and a small adnexal cyst.  Also, she had an abdominal and pelvic CT with contrast that showed a focal  posterior myometrial fibroid, right ovary normal, left ovary a small  functional cyst and what appeared to be possibly an omentum containing  umbilical hernia that has been asymptomatic.  She returns now for open  diagnostic laparoscopy with LVH-BSO.  This procedure including risk of  bleeding, infection, adjacent organ injury, possible need for open  additional surgery, transfusion risk, risk of infection, phlebitis, length  of expected recovery time all reviewed with her which she understands and  accepts.   PAST MEDICAL HISTORY:  Operations are tubal in 1991, endometrial ablation in  1999.  She has had one ectopic pregnancy treated by laparotomy and one  vaginal delivery in the past.  She did receive a transfusion at the time of  ectopic surgery.   FAMILY HISTORY:  Significant for mother and father with  hypertension, father  with hepatitis.  Mother also had MS.   PHYSICAL EXAMINATION:  VITAL SIGNS: Temperature 98.2, blood pressure 130/78.  HEENT:  Unremarkable.  NECK: Supple without masses.  LUNGS: Clear.  CARDIOVASCULAR: Regular rate and rhythm without murmurs, rubs, or gallops.  BREASTS: Without masses.  ABDOMEN:  Soft, flat, nontender.  PELVIC: Normal external genitalia.  Vagina clear.  Uterus upper limits of  normal size.  Adnexal unremarkable.  The fibroid was not readily palpable.  EXTREMITIES: Unremarkable.  NEUROLOGIC: Unremarkable.   IMPRESSION:  Pelvic pain secondary to degenerating fibroid.   PLAN:  Open diagnostic laparoscopy with laparoscopic abdominal hysterectomy  and bilateral salpingo-oophorectomy.  Procedure and risks reviewed as above.      Richard M. Marcelle Overlie, M.D.  Electronically Signed     RMH/MEDQ  D:  04/17/2006  T:  04/17/2006  Job:  [id number]

## 2010-12-24 NOTE — Discharge Summary (Signed)
NAME:  Elizabeth Hawkins, Elizabeth Hawkins          ACCOUNT NO.:  [id number]   MEDICAL RECORD NO.:  [id number]          PATIENT TYPE:  INP   LOCATION:  9311                          [facility]ACILITY:  [facility]   PHYSICIAN:  Duke Salvia. Marcelle Overlie, M.D.DATE OF BIRTH:  [birth year]   DATE OF ADMISSION:  04/19/2006  DATE OF DISCHARGE:  04/21/2006                                 DISCHARGE SUMMARY   DISCHARGE DIAGNOSES:  1. Symptomatic leiomyomata.  2. Attempted open diagnostic laparoscopy followed by total abdominal      hysterectomy bilateral salpingo-oophorectomy.  -BSO.  3. Asymptomatic umbilical hernia.   Summary of the history physical examination, please see admission HPI for  details.  Briefly, a 55 year old, G3, P1, who had a prior tubal and a prior  ablation in 1992, continues to have pelvic pain secondary to a degenerating  fibroid noted by ultrasound and also CT scan.  CT also shows an umbilical  hernia with a possible omentum adhering to that area.   HOSPITAL COURSE:  On April 19, 2006, under general anesthesia, the  patient underwent attempted open DL, could not get in, probably secondary to  some of the omental adhesions, therefore, a TAH-BSO was carried out without  complication.  She by palpation had a at 3-to-4-cm supraumbilical hernial  defect.  There was no bowel or omentum into that area but there was some  omentum adherent around that area.  Her first post-op day, she had good pain  control with her _On-Q catheters and oral pain meds.  Her Foley catheter was  removed.  She was ambulating without difficulty, afebrile, and tolerating a  regular diet.  By the a.m. of April 21, 2006, she was ambulating and was  ready for discharge.  At that point, the ___On -Q catheters were removed.  Clips were left in place.  She was ready for discharge.   VITAL SIGNS:  Afebrile.  BP slightly elevated at 150/90, which will need to  be followed up.   LABORATORY DATA:  C-MET on admission normal.  CBC:   Hemoglobin 12.6.  Blood  type O+.  Antibody screen was negative.  Her post-op CBC was 10.80 that was  on April 20, 2006.   DISPOSITION:  1. The patient was discharged on Tylox p.r.n. pain.  2. Will return to the office in 4 days to have the clips out.  3. Advised to report any incisional redness or drainage, increased pain or      bleeding, or fever over 101.  4. She was given specific instructions regarding diet, sex, exercise.   CONDITION:  Good.   ACTIVITY:  Graded increase.      Richard M. Marcelle Overlie, M.D.  Electronically Signed     RMH/MEDQ  D:  04/21/2006  T:  04/21/2006  Job:  [id number]

## 2010-12-24 NOTE — Op Note (Signed)
   NAME:  Elizabeth Hawkins, Elizabeth Hawkins                    ACCOUNT NO.:  [id number]   MEDICAL RECORD NO.:  [id number]                   PATIENT TYPE:  AMB   LOCATION:  DSC                                  FACILITY:  [facility]   PHYSICIAN:  Cindee Salt, MD                      DATE OF BIRTH:  1955-11-28   DATE OF PROCEDURE:  05/16/2002  DATE OF DISCHARGE:                                 OPERATIVE REPORT   PREOPERATIVE DIAGNOSIS:  Carpal tunnel syndrome right hand.   POSTOPERATIVE DIAGNOSIS:  Carpal tunnel syndrome right hand.   OPERATION:  Decompression right median nerve.   SURGEON:  Cindee Salt, M.D.   ASSISTANT:  R.N.   ANESTHESIA:  Forearm-based IV regional.   HISTORY:  The patient is a 55 year old female with history of carpal tunnel  syndrome and EMG nerve conductions positive which has not responded to  conservative treatment.   DESCRIPTION OF PROCEDURE:  The patient was brought to the operating room  where a forearm-based IV regional anesthetic was carried out without  difficulty.  She was prepped and draped using Betadine scrubbing solution  with the right arm free.  A longitudinal incision was made in the palm,  carried down through subcutaneous tissue, bleeders were electrocauterized,  the palmar fascia was split, superficial palmar artery was identified, the  flexor tendon to the ring and little finger identified to the ulnar side and  the median nerve, the carpal retinaculum was incised with sharp dissection.  A right angle and Sewell retractor were placed between the skin and forearm  fascia.  The fascia was released for approximately 3 cm proximal to the  wrist crease under direct vision.  The canal was explored.  No further  lesions were identified.  The wound was irrigated.  The skin was closed with  interrupted 5-0 nylon sutures.  Sterile compressive dressing and splint was  applied.  The patient tolerated the procedure well and was taken to the  recovery room for  observation in satisfactory condition.  She was discharged  home to return to the [facility] in 1 week on Vicodin and  Keflex.                                               Cindee Salt, MD    GK/MEDQ  D:  05/16/2002  T:  05/19/2002  Job:  [id number]

## 2011-05-12 ENCOUNTER — Other Ambulatory Visit: Payer: Self-pay | Admitting: Internal Medicine

## 2011-05-24 LAB — POCT HEMOGLOBIN-HEMACUE
Hemoglobin: 13
Operator id: 123881

## 2011-08-13 ENCOUNTER — Other Ambulatory Visit: Payer: Self-pay | Admitting: Internal Medicine

## 2011-12-16 ENCOUNTER — Ambulatory Visit (INDEPENDENT_AMBULATORY_CARE_PROVIDER_SITE_OTHER): Payer: 59 | Admitting: Family Medicine

## 2011-12-16 VITALS — BP 140/89 | HR 69 | Temp 97.9°F | Resp 18 | Ht 64.0 in | Wt 257.8 lb

## 2011-12-16 DIAGNOSIS — L299 Pruritus, unspecified: Secondary | ICD-10-CM

## 2011-12-16 DIAGNOSIS — W57XXXA Bitten or stung by nonvenomous insect and other nonvenomous arthropods, initial encounter: Secondary | ICD-10-CM

## 2011-12-16 MED ORDER — PREDNISONE 20 MG PO TABS: ORAL_TABLET | ORAL | Status: AC

## 2011-12-16 NOTE — Patient Instructions (Signed)
For itching may use your OTC anti- itch cream, and may also use a hydrocortisone cream.   For day time may use zyrtec or claritin.  Benadryl orally at night if needed.   If you are not feeling better or you are getting worse you may take your prednisone (oral steroid) prescription.

## 2011-12-16 NOTE — Progress Notes (Signed)
  Patient Name: Elizabeth Hawkins Date of Birth: 04-09-1956 Medical Record Number: [id number] Gender: female Date of Encounter: 12/16/2011  History of Present Illness:  Elizabeth Hawkins is a 56 y.o. very pleasant female patient who presents with the following:  Here today with an itchy rash which started just last night.  Is quite pruritic.  She tried some calamine type lotion.  Otherwise she feels ok currently- no SOB, no lip or tongue swelling.  Has not used any other medications yet.    No new foods, no new exposures recently.  She was at the [facility] last weekend and her friend broke out in an itchy rash.  She stayed at a friend's home.  However, she was feeling fine until yesterday.  Otherwise no exposures to new medications, foods, animals, etc.    Patient Active Problem List  Diagnoses  . GLUCOSE INTOLERANCE  . HYPERLIPIDEMIA  . ANEMIA-NOS  . ANXIETY  . DEPRESSION  . ESSENTIAL HYPERTENSION  . ALLERGIC RHINITIS  . ASTHMA  . PULMONARY NODULE, RIGHT LOWER LOBE  . COLITIS  . UTI  . HYPERSOMNIA, ASSOCIATED WITH SLEEP APNEA  . FATIGUE  . OTHER DYSPHAGIA  . RUQ PAIN   No past medical history on file. No past surgical history on file. History  Substance Use Topics  . Smoking status: Former Games developer  . Smokeless tobacco: Not on file  . Alcohol Use: Not on file   No family history on file. No Known Allergies  Medication list has been reviewed and updated.  Review of Systems: As per HPI- otherwise negative.   Physical Examination: Filed Vitals:   12/16/11 1013  BP: 140/89  Pulse: 69  Temp: 97.9 F (36.6 C)  TempSrc: Oral  Resp: 18  Height: 5\' 4"  (1.626 m)  Weight: 257 lb 12.8 oz (116.937 kg)    Body mass index is 44.25 kg/(m^2).  GEN: WDWN, NAD, Non-toxic, A & O x 3, obese HEENT: Atraumatic, Normocephalic. Neck supple. No masses, No LAD. No oral lesions, no angioedema Ears and Nose: No external deformity. CV: RRR, No M/G/R. No JVD. No thrill. No extra heart  sounds. PULM: CTA B, no wheezes, crackles, rhonchi. No retractions. No resp. distress. No accessory muscle use EXTR: No c/c/e NEURO Normal gait.  PSYCH: Normally interactive. Conversant. Not depressed or anxious appearing.  Calm demeanor.  There are a few (around 10) discrete lesions which appear to be consistent with insect bites on her body- mostly along her posterior left thigh and on the forearms.  No other rash, palms/ soles negative   Assessment and Plan: 1. Itching  predniSONE (DELTASONE) 20 MG tablet  2. Insect bites  predniSONE (DELTASONE) 20 MG tablet   Mild case of insect bites.  Gave Rx for prednisone as above, but encouraged her to try conservative measures such as benadryl cream and benadryl po, and hydrocortisone cream first.  If not able to tolerate itching may try prednisone taper of 40mg  for 3 days, 20mg  for 3 days.  However cautioned her that prednisone can increase blood sugar, among other issues.  Patient (or parent if minor) instructed to return to clinic or call if not better in 3-4 day(s).  Sooner if worse.

## 2012-05-01 ENCOUNTER — Ambulatory Visit (INDEPENDENT_AMBULATORY_CARE_PROVIDER_SITE_OTHER): Payer: 59 | Admitting: Internal Medicine

## 2012-05-01 ENCOUNTER — Encounter: Payer: Self-pay | Admitting: Internal Medicine

## 2012-05-01 ENCOUNTER — Ambulatory Visit (INDEPENDENT_AMBULATORY_CARE_PROVIDER_SITE_OTHER)
Admission: RE | Admit: 2012-05-01 | Discharge: 2012-05-01 | Disposition: A | Discharge status: Home / Self Care | Payer: 59 | Source: Ambulatory Visit | Attending: Internal Medicine | Admitting: Internal Medicine

## 2012-05-01 ENCOUNTER — Other Ambulatory Visit (INDEPENDENT_AMBULATORY_CARE_PROVIDER_SITE_OTHER): Payer: 59

## 2012-05-01 VITALS — BP 140/80 | HR 76 | Temp 98.3°F | Ht 64.0 in | Wt 266.2 lb

## 2012-05-01 DIAGNOSIS — R002 Palpitations: Secondary | ICD-10-CM

## 2012-05-01 DIAGNOSIS — E739 Lactose intolerance, unspecified: Secondary | ICD-10-CM

## 2012-05-01 DIAGNOSIS — Z86010 Personal history of colonic polyps: Secondary | ICD-10-CM | POA: Insufficient documentation

## 2012-05-01 DIAGNOSIS — Z0001 Encounter for general adult medical examination with abnormal findings: Secondary | ICD-10-CM | POA: Insufficient documentation

## 2012-05-01 DIAGNOSIS — D126 Benign neoplasm of colon, unspecified: Secondary | ICD-10-CM

## 2012-05-01 DIAGNOSIS — Z0000 Encounter for general adult medical examination without abnormal findings: Secondary | ICD-10-CM

## 2012-05-01 DIAGNOSIS — E669 Obesity, unspecified: Secondary | ICD-10-CM | POA: Insufficient documentation

## 2012-05-01 HISTORY — DX: Benign neoplasm of colon, unspecified: D12.6

## 2012-05-01 LAB — CBC WITH DIFFERENTIAL/PLATELET
Basophils Absolute: 0 10*3/uL (ref 0.0–0.1)
Eosinophils Relative: 2.3 % (ref 0.0–5.0)
Lymphocytes Relative: 52.1 % — ABNORMAL HIGH (ref 12.0–46.0)
Monocytes Relative: 8.4 % (ref 3.0–12.0)
Neutrophils Relative %: 36.5 % — ABNORMAL LOW (ref 43.0–77.0)
Platelets: 207 10*3/uL (ref 150.0–400.0)
RDW: 14.9 % — ABNORMAL HIGH (ref 11.5–14.6)
WBC: 6.7 10*3/uL (ref 4.5–10.5)

## 2012-05-01 LAB — URINALYSIS, ROUTINE W REFLEX MICROSCOPIC
Bilirubin Urine: NEGATIVE
Ketones, ur: NEGATIVE
Leukocytes, UA: NEGATIVE
Nitrite: NEGATIVE
Specific Gravity, Urine: 1.025 (ref 1.000–1.030)
Urobilinogen, UA: 0.2 (ref 0.0–1.0)
pH: 6 (ref 5.0–8.0)
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
## 2012-05-01 NOTE — Assessment & Plan Note (Signed)
stable overall by hx and exam, most recent data reviewed with pt, and pt to continue medical treatment as before Lab Results  Component Value Date   HGBA1C 5.8 03/04/2009

## 2012-05-01 NOTE — Patient Instructions (Addendum)
Your EKG was OK today Please remember to followup with your GYN for the yearly pap smear and/or mammogram - Dr Anders Simmonds will be contacted regarding the referral for: colonoscopy Please go to XRAY in the Basement for the x-ray test Please go to LAB in the Basement for the blood and/or urine tests to be done today You will be contacted by phone if any changes need to be made immediately.  Otherwise, you will receive a letter about your results with an explanation. Please remember to sign up for My Chart at your earliest convenience, as this will be important to you in the future with finding out test results. You will be contacted regarding the referral for: echocardiogram, and cardiology Please return in 6 months or sooner if needed

## 2012-05-01 NOTE — Assessment & Plan Note (Signed)

## 2012-05-01 NOTE — Assessment & Plan Note (Signed)
May 2009 with [facility], but pt requests change to [facility] - for GI referral, was due for colon f/u 2012

## 2012-05-01 NOTE — Assessment & Plan Note (Signed)
Unclear etiology, ? Anxiety related, for echo, card consult per pt request

## 2012-05-02 ENCOUNTER — Encounter: Payer: Self-pay | Admitting: Internal Medicine

## 2012-05-02 LAB — HEPATIC FUNCTION PANEL
ALT: 19 U/L (ref 0–35)
AST: 25 U/L (ref 0–37)
Albumin: 3.8 g/dL (ref 3.5–5.2)
Alkaline Phosphatase: 45 U/L (ref 39–117)
Total Protein: 7.3 g/dL (ref 6.0–8.3)

## 2012-05-02 LAB — BASIC METABOLIC PANEL
BUN: 13 mg/dL (ref 6–23)
CO2: 27 mEq/L (ref 19–32)
Chloride: 105 mEq/L (ref 96–112)
GFR: 103.1 mL/min (ref >60.00)
Glucose, Bld: 103 mg/dL — ABNORMAL HIGH (ref 70–99)
Potassium: 4.3 mEq/L (ref 3.5–5.1)

## 2012-05-02 LAB — LIPID PANEL
Cholesterol: 176 mg/dL (ref 0–200)
VLDL: 31.6 mg/dL (ref 0.0–40.0)

## 2012-05-02 LAB — TSH: TSH: 3.05 u[IU]/mL (ref 0.35–5.50)

## 2012-05-07 ENCOUNTER — Encounter: Payer: Self-pay | Admitting: Internal Medicine

## 2012-05-07 ENCOUNTER — Ambulatory Visit (HOSPITAL_COMMUNITY): Payer: 59 | Attending: Cardiology

## 2012-05-07 DIAGNOSIS — I059 Rheumatic mitral valve disease, unspecified: Secondary | ICD-10-CM | POA: Insufficient documentation

## 2012-05-07 DIAGNOSIS — I369 Nonrheumatic tricuspid valve disorder, unspecified: Secondary | ICD-10-CM | POA: Insufficient documentation

## 2012-05-07 DIAGNOSIS — R002 Palpitations: Secondary | ICD-10-CM

## 2012-05-07 NOTE — Progress Notes (Signed)
Echocardiogram performed.  

## 2012-05-10 ENCOUNTER — Ambulatory Visit (AMBULATORY_SURGERY_CENTER): Payer: 59 | Admitting: *Deleted

## 2012-05-10 VITALS — Ht 64.0 in | Wt 265.0 lb

## 2012-05-10 DIAGNOSIS — Z1211 Encounter for screening for malignant neoplasm of colon: Secondary | ICD-10-CM

## 2012-05-10 MED ORDER — SUPREP BOWEL PREP KIT 17.5-3.13-1.6 GM/177ML PO SOLN: ORAL | Status: DC

## 2012-05-18 ENCOUNTER — Ambulatory Visit: Payer: 59 | Admitting: Cardiovascular Disease

## 2012-05-21 ENCOUNTER — Telehealth: Payer: Self-pay | Admitting: Internal Medicine

## 2012-05-21 NOTE — Telephone Encounter (Signed)
Pt has questions about insurance coverage for procedure.  I gave pt Janelle's office number.

## 2012-05-24 ENCOUNTER — Encounter: Payer: Self-pay | Admitting: Internal Medicine

## 2012-05-24 ENCOUNTER — Ambulatory Visit (AMBULATORY_SURGERY_CENTER): Payer: 59 | Admitting: Internal Medicine

## 2012-05-24 VITALS — BP 114/85 | HR 74 | Temp 97.9°F | Resp 10 | Ht 64.0 in | Wt 265.0 lb

## 2012-05-24 DIAGNOSIS — D126 Benign neoplasm of colon, unspecified: Secondary | ICD-10-CM

## 2012-05-24 DIAGNOSIS — Z86010 Personal history of colonic polyps: Secondary | ICD-10-CM

## 2012-05-24 DIAGNOSIS — Z1211 Encounter for screening for malignant neoplasm of colon: Secondary | ICD-10-CM

## 2012-05-24 MED ORDER — SODIUM CHLORIDE 0.9 % IV SOLN: 500.0000 mL | INTRAVENOUS | Status: DC
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
## 2012-05-24 NOTE — Patient Instructions (Addendum)
One tiny polyp was removed. I will send a letter about the results and when to have your next routine colonoscopy.  Thank you for choosing me and [facility].  Iva Boop, MD, FACG YOU HAD AN ENDOSCOPIC PROCEDURE TODAY AT THE [facility]: Refer to the procedure report that was given to you for any specific questions about what was found during the examination.  If the procedure report does not answer your questions, please call your gastroenterologist to clarify.  If you requested that your care partner not be given the details of your procedure findings, then the procedure report has been included in a sealed envelope for you to review at your convenience later.  YOU SHOULD EXPECT: Some feelings of bloating in the abdomen. Passage of more gas than usual.  Walking can help get rid of the air that was put into your GI tract during the procedure and reduce the bloating. If you had a lower endoscopy (such as a colonoscopy or flexible sigmoidoscopy) you may notice spotting of blood in your stool or on the toilet paper. If you underwent a bowel prep for your procedure, then you may not have a normal bowel movement for a few days.  DIET: Your first meal following the procedure should be a light meal and then it is ok to progress to your normal diet.  A half-sandwich or bowl of soup is an example of a good first meal.  Heavy or fried foods are harder to digest and may make you feel nauseous or bloated.  Likewise meals heavy in dairy and vegetables can cause extra gas to form and this can also increase the bloating.  Drink plenty of fluids but you should avoid alcoholic beverages for 24 hours.  ACTIVITY: Your care partner should take you home directly after the procedure.  You should plan to take it easy, moving slowly for the rest of the day.  You can resume normal activity the day after the procedure however you should NOT DRIVE or use heavy machinery for 24 hours (because of the  sedation medicines used during the test).    SYMPTOMS TO REPORT IMMEDIATELY: A gastroenterologist can be reached at any hour.  During normal business hours, 8:30 AM to 5:00 PM Monday through Friday, call [phone].  After hours and on weekends, please call the GI answering service at [phone] who will take a message and have the physician on call contact you.   Following lower endoscopy (colonoscopy or flexible sigmoidoscopy):  Excessive amounts of blood in the stool  Significant tenderness or worsening of abdominal pains  Swelling of the abdomen that is new, acute  Fever of 100F or higher   FOLLOW UP: If any biopsies were taken you will be contacted by phone or by letter within the next 1-3 weeks.  Call your gastroenterologist if you have not heard about the biopsies in 3 weeks.  Our staff will call the home number listed on your records the next business day following your procedure to check on you and address any questions or concerns that you may have at that time regarding the information given to you following your procedure. This is a courtesy call and so if there is no answer at the home number and we have not heard from you through the emergency physician on call, we will assume that you have returned to your regular daily activities without incident.  SIGNATURES/CONFIDENTIALITY: You and/or your care partner have signed paperwork which will be  entered into your electronic medical record.  These signatures attest to the fact that that the information above on your After Visit Summary has been reviewed and is understood.  Full responsibility of the confidentiality of this discharge information lies with you and/or your care-partner.   Polyp information given.  Dr. Leone Payor will advise you about next procedure after he recieves your pathology report.

## 2012-05-24 NOTE — Op Note (Signed)
[facility] [address] [place], [address]   COLONOSCOPY PROCEDURE REPORT  PATIENT: Elizabeth Hawkins, Elizabeth Hawkins  MR#: [id number] BIRTHDATE: [birth year] , 56  yrs. old GENDER: Female ENDOSCOPIST: Iva Boop, MD, [facility] REFERRED NW:GNFAO John, M.D. PROCEDURE DATE:  05/24/2012 PROCEDURE:   Colonoscopy with biopsy ASA CLASS:   Class II INDICATIONS:patient's personal history of adenomatous colon polyps.  MEDICATIONS: propofol (Diprivan) 150mg  IV, MAC sedation, administered by CRNA, and These medications were titrated to patient response per physician's verbal order  DESCRIPTION OF PROCEDURE:   After the risks benefits and alternatives of the procedure were thoroughly explained, informed consent was obtained.  A digital rectal exam revealed no abnormalities of the rectum.   The LB PCF-H180AL [id number] and LB CF-H180AL [id number]  endoscope was introduced through the anus and advanced to the cecum, which was identified by both the appendix and ileocecal valve. No adverse events experienced.   The quality of the prep was Suprep excellent  The instrument was then slowly withdrawn as the colon was fully examined.      COLON FINDINGS: A polypoid shaped sessile polyp measuring 3 mm in size was found at the splenic flexure.  A polypectomy was performed with cold forceps.  The resection was complete and the polyp tissue was completely retrieved.   The colon mucosa was otherwise normal. Retroflexed views revealed no abnormalities. The time to cecum=2 minutes 25 seconds.  Withdrawal time=7 minutes 10 seconds.  The scope was withdrawn and the procedure completed. COMPLICATIONS: There were no complications.  ENDOSCOPIC IMPRESSION: 1.   Sessile polyp measuring 3 mm in size was found at the splenic flexure; polypectomy was performed with cold forceps 2.   The colon mucosa was otherwise normal - excellent prep 3.    Personal history of 6 mm adenoma 2009  RECOMMENDATIONS: Timing of  repeat colonoscopy will be determined by pathology findings.   eSigned:  Iva Boop, MD, [facility] 05/24/2012 2:00 PM   cc: Corwin Levins, MD and The Patient

## 2012-05-25 ENCOUNTER — Telehealth: Payer: Self-pay | Admitting: *Deleted

## 2012-05-25 NOTE — Telephone Encounter (Signed)
  Follow up Call-  Call back number 05/24/2012  Post procedure Call Back phone  # [phone] cell  Permission to leave phone message Yes     Patient questions:  Do you have a fever, pain , or abdominal swelling? no Pain Score  0 *  Have you tolerated food without any problems? yes  Have you been able to return to your normal activities? yes  Do you have any questions about your discharge instructions: Diet   no Medications  no Follow up visit  no  Do you have questions or concerns about your Care? no  Actions: * If pain score is 4 or above: No action needed, pain <4.

## 2012-05-29 ENCOUNTER — Encounter: Payer: Self-pay | Admitting: Internal Medicine

## 2012-05-29 NOTE — Progress Notes (Signed)
Quick Note:  Diminutive adenoma Repeat colon 05/2017 ______ 

## 2012-06-13 ENCOUNTER — Ambulatory Visit: Payer: 59 | Admitting: Cardiovascular Disease

## 2012-07-14 ENCOUNTER — Ambulatory Visit (INDEPENDENT_AMBULATORY_CARE_PROVIDER_SITE_OTHER): Payer: 59 | Admitting: Family Medicine

## 2012-07-14 VITALS — BP 128/86 | HR 72 | Temp 98.0°F | Resp 18 | Ht 65.5 in | Wt 261.0 lb

## 2012-07-14 DIAGNOSIS — S139XXA Sprain of joints and ligaments of unspecified parts of neck, initial encounter: Secondary | ICD-10-CM

## 2012-07-14 MED ORDER — OXAPROZIN 600 MG PO TABS: 1200.0000 mg | ORAL_TABLET | Freq: Every day | ORAL | Status: DC

## 2012-07-14 MED ORDER — METHOCARBAMOL 500 MG PO TABS: ORAL_TABLET | ORAL | Status: DC
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
## 2012-07-14 NOTE — Patient Instructions (Addendum)
Alternate ice and heat for neck, try warm showers to help with muscle pain. Gentle range of motion of neck, and pendulum circles of shoulders will be helpful. If you have any increased pain, numbness tingling call us immediately.Cervical Sprain A cervical sprain is when the ligaments in the neck stretch or tear. The ligaments are the tissues that hold the neck bones in place. HOME CARE   Put ice on the injured area.  Put ice in a plastic bag.  Place a towel between your skin and the bag.  Leave the ice on for 15 to 20 minutes, 3 to 4 times a day.  Only take medicine as told by your doctor.  Keep all doctor visits as told.  Keep all physical therapy visits as told.  If your doctor gives you a neck collar, wear it as told.  Do not drive while wearing a neck collar.  Adjust your work station so that you have good posture while you work.  Avoid positions and activities that make your problems worse.  Warm up and stretch before being active. GET HELP RIGHT AWAY IF:   You are bleeding or your stomach is upset.  You have an allergic reaction to your medicine.  Your problems (symptoms) get worse.  You develop new problems.  You lose feeling (numbness) or you cannot move (paralysis) any part of your body.  You have tingling or weakness in any part of your body.  Your pain is not controlled with medicine.  You cannot take less pain medicine over time as planned.  Your activity level does not improve as expected. MAKE SURE YOU:   Understand these instructions.  Will watch your condition.  Will get help right away if you are not doing well or get worse. Document Released: 01/11/2008 Document Revised: 10/17/2011 Document Reviewed: 04/28/2011 [facility] Patient Information 2013 [place], [place].

## 2012-07-19 ENCOUNTER — Telehealth: Payer: Self-pay

## 2012-07-19 NOTE — Telephone Encounter (Signed)
The patient called to request a stronger/different pain medicine, because the medicine given to her by Dr. Alwyn Ren on 07/14/12 is not working well and she is experiencing a lot of pain getting up and down.  Please call the patient at [phone].

## 2012-07-21 NOTE — Telephone Encounter (Signed)
Call: If she is continuing to have that much pain she should come in for recheck.

## 2012-07-23 NOTE — Telephone Encounter (Signed)
Spoke with patient and advised her to return to clinic and gave Dr. Frederik Pear hours.  Patient states understanding.

## 2012-07-25 ENCOUNTER — Ambulatory Visit: Payer: 59

## 2012-07-25 ENCOUNTER — Encounter: Payer: Self-pay | Admitting: Family Medicine

## 2012-07-25 ENCOUNTER — Ambulatory Visit (INDEPENDENT_AMBULATORY_CARE_PROVIDER_SITE_OTHER): Payer: 59 | Admitting: Family Medicine

## 2012-07-25 VITALS — BP 165/81 | HR 72 | Temp 98.0°F | Resp 20 | Ht 65.0 in | Wt 264.0 lb

## 2012-07-25 DIAGNOSIS — M549 Dorsalgia, unspecified: Secondary | ICD-10-CM

## 2012-07-25 MED ORDER — CYCLOBENZAPRINE HCL 10 MG PO TABS: 10.0000 mg | ORAL_TABLET | Freq: Two times a day (BID) | ORAL | Status: DC | PRN

## 2012-07-25 NOTE — Patient Instructions (Addendum)
Use the flexeril as needed.  You may also continue to use ice and/or heat.   Be careful of sedation when using the flexeril.
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
## 2012-07-28 ENCOUNTER — Telehealth: Payer: Self-pay

## 2012-07-28 NOTE — Telephone Encounter (Signed)
PT STATES DR COPLAND TOLD HER TO CALL IF THE MEDICINE DOESN'T HELP. PLEASE CALL [phone]

## 2012-07-29 ENCOUNTER — Other Ambulatory Visit: Payer: Self-pay | Admitting: Physician Assistant

## 2012-07-29 ENCOUNTER — Telehealth: Payer: Self-pay

## 2012-07-29 MED ORDER — NAPROXEN 500 MG PO TABS: 500.0000 mg | ORAL_TABLET | Freq: Two times a day (BID) | ORAL | Status: DC

## 2012-07-29 NOTE — Telephone Encounter (Signed)
Angela sent in rx for naproxen and pt notified

## 2012-07-29 NOTE — Progress Notes (Signed)
Have her continue the flexeril and other instructions.  I sent another medication that she can add in addition to the flexeril.

## 2012-07-29 NOTE — Telephone Encounter (Signed)
PT STATES DR COPLAND WANTED HER TO CALL BACK IF THE MEDICINE WAS WORKING AND SHE CALLED YESTERDAY FOR A CALLBACK AND NEVER RECEIVED ONE REALLY WOULD LIKE TO SPEAK WITH SOMEONE. PLEASE CALL [phone]

## 2012-07-29 NOTE — Telephone Encounter (Signed)
Back pain is no better she has tried the Flexeril and ice and it is not working. Please advise

## 2012-08-28 ENCOUNTER — Other Ambulatory Visit: Payer: Self-pay | Admitting: Internal Medicine

## 2012-10-30 ENCOUNTER — Ambulatory Visit (INDEPENDENT_AMBULATORY_CARE_PROVIDER_SITE_OTHER): Payer: 59 | Admitting: Internal Medicine

## 2012-10-30 ENCOUNTER — Encounter: Payer: Self-pay | Admitting: Internal Medicine

## 2012-10-30 VITALS — BP 120/88 | HR 71 | Temp 97.9°F | Ht 64.0 in | Wt 263.8 lb

## 2012-10-30 DIAGNOSIS — H04123 Dry eye syndrome of bilateral lacrimal glands: Secondary | ICD-10-CM | POA: Insufficient documentation

## 2012-10-30 DIAGNOSIS — I10 Essential (primary) hypertension: Secondary | ICD-10-CM

## 2012-10-30 DIAGNOSIS — M79644 Pain in right finger(s): Secondary | ICD-10-CM | POA: Insufficient documentation

## 2012-10-30 DIAGNOSIS — M79609 Pain in unspecified limb: Secondary | ICD-10-CM

## 2012-10-30 DIAGNOSIS — H04129 Dry eye syndrome of unspecified lacrimal gland: Secondary | ICD-10-CM

## 2012-10-30 DIAGNOSIS — F329 Major depressive disorder, single episode, unspecified: Secondary | ICD-10-CM
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
## 2012-10-30 NOTE — Assessment & Plan Note (Signed)
Mild, for advil prn, but will need hand surgury referral

## 2012-10-30 NOTE — Patient Instructions (Signed)
Please continue all other medications as before You will be contacted regarding the referral for: hand surgury Thank you for enrolling in MyChart. Please follow the instructions below to securely access your online medical record. MyChart allows you to send messages to your doctor, view your test results, renew your prescriptions, schedule appointments, and more. To Log into My Chart online, please go by [employer] or [employer] to [email or web address], or download the MyChart App from the [employer] of [employer] .  Your Username is: [email or web address] (pass grammie) Please send a practice Message on Mychart later today.

## 2012-10-30 NOTE — Assessment & Plan Note (Signed)
stable overall by history and exam, recent data reviewed with pt, and pt to continue medical treatment as before,  to f/u any worsening symptoms or concerns BP Readings from Last 3 Encounters:  10/30/12 120/88  07/25/12 165/81  07/14/12 128/86

## 2012-10-30 NOTE — Assessment & Plan Note (Signed)
Exam ok, but pt with subjective symptoms, no dry mouth or other sicca symptoms, ok to cont artifical tears prn

## 2012-10-30 NOTE — Assessment & Plan Note (Signed)
stable overall by history and exam, recent data reviewed with pt, and pt to continue medical treatment as before,  to f/u any worsening symptoms or concerns Lab Results  Component Value Date   WBC 6.7 05/01/2012   HGB 10.8* 05/01/2012   HCT 35.3* 05/01/2012   PLT 207.0 05/01/2012   GLUCOSE 103* 05/01/2012   CHOL 176 05/01/2012   TRIG 158.0* 05/01/2012   HDL 32.70* 05/01/2012   LDLCALC 112* 05/01/2012   ALT 19 05/01/2012   AST 25 05/01/2012   NA 140 05/01/2012   K 4.3 05/01/2012   CL 105 05/01/2012   CREATININE 0.8 05/01/2012   BUN 13 05/01/2012   CO2 27 05/01/2012   TSH 3.05 05/01/2012   INR 1.11 06/26/2009   HGBA1C 6.0 05/01/2012

## 2013-03-08 LAB — HM MAMMOGRAPHY

## 2013-04-12 ENCOUNTER — Ambulatory Visit: Payer: 59

## 2013-04-12 ENCOUNTER — Ambulatory Visit (HOSPITAL_COMMUNITY)
Admission: RE | Admit: 2013-04-12 | Discharge: 2013-04-12 | Disposition: A | Discharge status: Home / Self Care | Payer: 59 | Source: Ambulatory Visit | Attending: Family Medicine | Admitting: Family Medicine

## 2013-04-12 ENCOUNTER — Telehealth: Payer: Self-pay | Admitting: Radiology

## 2013-04-12 ENCOUNTER — Ambulatory Visit (INDEPENDENT_AMBULATORY_CARE_PROVIDER_SITE_OTHER): Payer: 59 | Admitting: Family Medicine

## 2013-04-12 VITALS — BP 120/90 | HR 72 | Temp 98.2°F | Resp 18 | Ht 64.25 in | Wt 277.0 lb

## 2013-04-12 DIAGNOSIS — R002 Palpitations: Secondary | ICD-10-CM

## 2013-04-12 DIAGNOSIS — R635 Abnormal weight gain: Secondary | ICD-10-CM

## 2013-04-12 DIAGNOSIS — M25562 Pain in left knee: Secondary | ICD-10-CM

## 2013-04-12 DIAGNOSIS — R609 Edema, unspecified: Secondary | ICD-10-CM | POA: Insufficient documentation

## 2013-04-12 DIAGNOSIS — R0601 Orthopnea: Secondary | ICD-10-CM

## 2013-04-12 DIAGNOSIS — E039 Hypothyroidism, unspecified: Secondary | ICD-10-CM

## 2013-04-12 DIAGNOSIS — E669 Obesity, unspecified: Secondary | ICD-10-CM | POA: Insufficient documentation

## 2013-04-12 DIAGNOSIS — M25569 Pain in unspecified knee: Secondary | ICD-10-CM

## 2013-04-12 LAB — POCT CBC
Granulocyte percent: 46.7 %G (ref 37–80)
HCT, POC: 37.2 % — AB (ref 37.7–47.9)
Hemoglobin: 11.1 g/dL — AB (ref 12.2–16.2)
Lymph, poc: 3 (ref 0.6–3.4)
MCH, POC: 23.3 pg — AB (ref 27–31.2)
MCHC: 29.8 g/dL — AB (ref 31.8–35.4)
MCV: 77.9 fL — AB (ref 80–97)
MID (cbc): 0.5 (ref 0–0.9)
MPV: 8.8 fL (ref 0–99.8)
POC Granulocyte: 3.1 (ref 2–6.9)
POC LYMPH PERCENT: 46.1 %L (ref 10–50)
POC MID %: 7.2 %M (ref 0–12)
Platelet Count, POC: 195 10*3/uL (ref 142–424)
RBC: 4.77 M/uL (ref 4.04–5.48)
RDW, POC: 15.8 %
WBC: 6.6 10*3/uL (ref 4.6–10.2)

## 2013-04-12 LAB — COMPREHENSIVE METABOLIC PANEL
ALT: 19 U/L (ref 0–35)
AST: 23 U/L (ref 0–37)
Albumin: 3.8 g/dL (ref 3.5–5.2)
Alkaline Phosphatase: 40 U/L (ref 39–117)
BUN: 12 mg/dL (ref 6–23)
CO2: 29 mEq/L (ref 19–32)
Calcium: 9.1 mg/dL (ref 8.4–10.5)
Chloride: 105 mEq/L (ref 96–112)
Creat: 0.84 mg/dL (ref 0.50–1.10)
Glucose, Bld: 99 mg/dL (ref 70–99)
Potassium: 4.1 mEq/L (ref 3.5–5.3)
Sodium: 139 mEq/L (ref 135–145)
Total Bilirubin: 0.4 mg/dL (ref 0.3–1.2)
Total Protein: 7 g/dL (ref 6.0–8.3)

## 2013-04-12 LAB — TSH: TSH: 8.2 u[IU]/mL — ABNORMAL HIGH (ref 0.350–4.500)

## 2013-04-12 LAB — BRAIN NATRIURETIC PEPTIDE: Brain Natriuretic Peptide: 32 pg/mL (ref 0.0–100.0)

## 2013-04-12 MED ORDER — LEVOTHYROXINE SODIUM 50 MCG PO TABS: 25.0000 ug | ORAL_TABLET | Freq: Every day | ORAL | Status: DC

## 2013-04-12 MED ORDER — HYDROCHLOROTHIAZIDE 12.5 MG PO TABS: 12.5000 mg | ORAL_TABLET | Freq: Two times a day (BID) | ORAL | Status: DC | PRN

## 2013-04-12 NOTE — Progress Notes (Signed)
Bilateral lower extremity venous duplex:  No evidence of DVT, superficial thrombosis, or Baker's Cyst.   

## 2013-04-12 NOTE — Patient Instructions (Addendum)
We will call you with your ultrasound results and lab results.   Try using an ace wrap on your knee for compression
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
## 2013-04-12 NOTE — Telephone Encounter (Signed)
Venous doppler, today at 3pm, at [facility]. Go to [facility] entrance, then to Admitting. There is free valet parking if you can not find a spot.  Driving directions to [facility] [id number]  [phone]  - more info    [address]  [place], [place] [address]     1. Head south on [address] toward [address]      0.5 mi    2. Sharp left onto [address]      0.6 mi    3. Turn left onto the [address] ramp      0.2 mi    4. Merge onto [address]      3.0 mi    5. Continue straight to stay on [address]      0.4 mi    6. Slight left to stay on [address]      1.2 mi    7. Turn right onto [address]      0.1 mi    8. Turn left onto [address]      361 ft    9. Take the 1st left onto [facility]  Destination will be on the right

## 2013-04-15 ENCOUNTER — Telehealth: Payer: Self-pay | Admitting: Family Medicine

## 2013-04-15 ENCOUNTER — Encounter: Payer: Self-pay | Admitting: Family Medicine

## 2013-04-15 NOTE — Telephone Encounter (Signed)
Called Pt to check on her leg swelling and to check to see if she is tolerating the HCTZ. She states her leg is less swollen and believes the HCTZ is working and she is tolerating the med fine. She states shes having some knee pain but thinks its ortho related. Notified her that her TSH is underactive and that we need to recheck her levels in the office in about 1 month and we will also probably check her anemia level- recommended that she start an OTC iron supplement.  She stated she received a letter from [facility] last week stating she needs to schedule a colonoscopy. She recalls having a colonoscopy last year with [place] though- I instructed her to call [facility] and find out for sure  When she needs to follow up and have her next colonoscopy and then notify [facility]. Mailing copy of labs done 04/12/13 to patient today so she can take to cardio appt.  Eileen Stanford

## 2013-04-18 ENCOUNTER — Encounter: Payer: Self-pay | Admitting: Internal Medicine

## 2013-04-18 ENCOUNTER — Ambulatory Visit (INDEPENDENT_AMBULATORY_CARE_PROVIDER_SITE_OTHER): Payer: 59 | Admitting: Internal Medicine

## 2013-04-18 ENCOUNTER — Encounter: Payer: Self-pay | Admitting: *Deleted

## 2013-04-18 VITALS — BP 120/86 | HR 70 | Ht 64.0 in | Wt 272.0 lb

## 2013-04-18 DIAGNOSIS — R0602 Shortness of breath: Secondary | ICD-10-CM

## 2013-04-18 DIAGNOSIS — R609 Edema, unspecified: Secondary | ICD-10-CM

## 2013-04-18 MED ORDER — POTASSIUM CHLORIDE ER 10 MEQ PO TBCR: 10.0000 meq | EXTENDED_RELEASE_TABLET | Freq: Every day | ORAL | Status: DC

## 2013-04-18 MED ORDER — HYDROCHLOROTHIAZIDE 25 MG PO TABS: 25.0000 mg | ORAL_TABLET | Freq: Every day | ORAL | Status: DC
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
## 2013-04-18 NOTE — Patient Instructions (Addendum)
Your physician has requested that you have an echocardiogram next week.  Echocardiography is a painless test that uses sound waves to create images of your heart. It provides your doctor with information about the size and shape of your heart and how well your heart's chambers and valves are working. This procedure takes approximately one hour. There are no restrictions for this procedure.  Your physician recommends that you return for lab work in: next week same day as your echo: bmp .    Medication changes:   Start:  Potassium daily                                       Increase:  HCTZ to 25mg  daily

## 2013-04-25 ENCOUNTER — Telehealth: Payer: Self-pay

## 2013-04-25 DIAGNOSIS — E039 Hypothyroidism, unspecified: Secondary | ICD-10-CM

## 2013-04-25 MED ORDER — LEVOTHYROXINE SODIUM 50 MCG PO TABS: 50.0000 ug | ORAL_TABLET | Freq: Every day | ORAL | Status: DC

## 2013-04-25 NOTE — Telephone Encounter (Signed)
Patient states that she received some thyroid medication and has not been taking it appropriately. States that she has been taking a whole pill instead of half a pill and now she is having a extreme pain in her hands. She is also down to her last 3 pills and is going out of town this weekend. [phone]

## 2013-04-25 NOTE — Telephone Encounter (Signed)
Called her back- no answer so left detailed message.  I do not think that 50 mcg would be too high of a dose for her, although I do generally like to start lower.  I will refill this medication.  We do need to check her TSH soon to check on her level.  I do not know why she is having "extreme pain" in her hands, but I would recommend that she have this evaluated right away.

## 2013-05-02 ENCOUNTER — Other Ambulatory Visit (INDEPENDENT_AMBULATORY_CARE_PROVIDER_SITE_OTHER): Payer: 59

## 2013-05-02 ENCOUNTER — Ambulatory Visit (HOSPITAL_COMMUNITY): Payer: 59 | Attending: Internal Medicine

## 2013-05-02 DIAGNOSIS — R609 Edema, unspecified: Secondary | ICD-10-CM

## 2013-05-02 DIAGNOSIS — R0602 Shortness of breath: Secondary | ICD-10-CM

## 2013-05-02 DIAGNOSIS — I359 Nonrheumatic aortic valve disorder, unspecified: Secondary | ICD-10-CM | POA: Insufficient documentation

## 2013-05-02 DIAGNOSIS — R0609 Other forms of dyspnea: Secondary | ICD-10-CM | POA: Insufficient documentation

## 2013-05-02 DIAGNOSIS — I059 Rheumatic mitral valve disease, unspecified: Secondary | ICD-10-CM | POA: Insufficient documentation

## 2013-05-02 DIAGNOSIS — R0989 Other specified symptoms and signs involving the circulatory and respiratory systems: Secondary | ICD-10-CM | POA: Insufficient documentation

## 2013-05-02 DIAGNOSIS — I079 Rheumatic tricuspid valve disease, unspecified: Secondary | ICD-10-CM | POA: Insufficient documentation

## 2013-05-02 NOTE — Progress Notes (Signed)
Echocardiogram performed.  

## 2013-05-03 LAB — BASIC METABOLIC PANEL
BUN: 14 mg/dL (ref 6–23)
CO2: 28 mEq/L (ref 19–32)
Glucose, Bld: 127 mg/dL — ABNORMAL HIGH (ref 70–99)
Potassium: 3.7 mEq/L (ref 3.5–5.1)
Sodium: 139 mEq/L (ref 135–145)

## 2013-06-13 ENCOUNTER — Other Ambulatory Visit: Payer: Self-pay

## 2013-06-22 ENCOUNTER — Other Ambulatory Visit: Payer: Self-pay | Admitting: Internal Medicine

## 2013-06-26 ENCOUNTER — Ambulatory Visit (INDEPENDENT_AMBULATORY_CARE_PROVIDER_SITE_OTHER): Payer: Medicare Other | Admitting: Family Medicine

## 2013-06-26 ENCOUNTER — Other Ambulatory Visit: Payer: Self-pay | Admitting: Family Medicine

## 2013-06-26 ENCOUNTER — Ambulatory Visit: Payer: Medicare Other

## 2013-06-26 VITALS — BP 130/88 | HR 85 | Temp 98.5°F | Resp 16 | Ht 64.0 in | Wt 278.0 lb

## 2013-06-26 DIAGNOSIS — D649 Anemia, unspecified: Secondary | ICD-10-CM

## 2013-06-26 DIAGNOSIS — R0609 Other forms of dyspnea: Secondary | ICD-10-CM | POA: Diagnosis not present

## 2013-06-26 DIAGNOSIS — R0600 Dyspnea, unspecified: Secondary | ICD-10-CM

## 2013-06-26 DIAGNOSIS — R05 Cough: Secondary | ICD-10-CM

## 2013-06-26 DIAGNOSIS — E039 Hypothyroidism, unspecified: Secondary | ICD-10-CM | POA: Diagnosis not present

## 2013-06-26 DIAGNOSIS — R079 Chest pain, unspecified: Secondary | ICD-10-CM | POA: Diagnosis not present

## 2013-06-26 DIAGNOSIS — R059 Cough, unspecified: Secondary | ICD-10-CM

## 2013-06-26 DIAGNOSIS — R911 Solitary pulmonary nodule: Secondary | ICD-10-CM

## 2013-06-26 LAB — POCT CBC
Granulocyte percent: 63.5 %G (ref 37–80)
MCV: 78.3 fL — AB (ref 80–97)
MID (cbc): 0.6 (ref 0–0.9)
MPV: 9.2 fL (ref 0–99.8)
POC Granulocyte: 6 (ref 2–6.9)
POC LYMPH PERCENT: 29.9 %L (ref 10–50)
POC MID %: 6.6 %M (ref 0–12)
Platelet Count, POC: 207 10*3/uL (ref 142–424)
RBC: 4.93 M/uL (ref 4.04–5.48)
RDW, POC: 15.5 %

## 2013-06-26 LAB — BASIC METABOLIC PANEL
BUN: 12 mg/dL (ref 6–23)
CO2: 31 mEq/L (ref 19–32)
Chloride: 103 mEq/L (ref 96–112)
Creat: 0.79 mg/dL (ref 0.50–1.10)
Sodium: 141 mEq/L (ref 135–145)

## 2013-06-26 LAB — TSH: TSH: 4.27 u[IU]/mL (ref 0.350–4.500)

## 2013-06-26 MED ORDER — ALBUTEROL SULFATE HFA 108 (90 BASE) MCG/ACT IN AERS: 2.0000 | INHALATION_SPRAY | Freq: Four times a day (QID) | RESPIRATORY_TRACT | Status: DC | PRN

## 2013-06-26 MED ORDER — CEFDINIR 300 MG PO CAPS: 300.0000 mg | ORAL_CAPSULE | Freq: Two times a day (BID) | ORAL | Status: DC

## 2013-06-26 MED ORDER — IPRATROPIUM BROMIDE 0.03 % NA SOLN: 2.0000 | Freq: Four times a day (QID) | NASAL | Status: DC

## 2013-06-26 MED ORDER — FLUCONAZOLE 150 MG PO TABS: 150.0000 mg | ORAL_TABLET | Freq: Once | ORAL | Status: DC

## 2013-06-26 MED ORDER — HYDROCODONE-HOMATROPINE 5-1.5 MG/5ML PO SYRP: 5.0000 mL | ORAL_SOLUTION | Freq: Three times a day (TID) | ORAL | Status: DC | PRN

## 2013-06-26 NOTE — Patient Instructions (Signed)
Use the atrovent nasal for post- nasal drainage that may be causing your sore throat.   Use the albuterol inhaler as needed for wheezing.    Do not take the cough syrup along with any other sedating medications such as your xanax.    If you are not feeling better in the next few days fill and take the omnicef (antibiotic) rx.  You can use the diflucan if needed as well.
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
## 2013-06-27 NOTE — Addendum Note (Signed)
Addended by: Abbe Amsterdam C on: 06/27/2013 05:59 PM   Modules accepted: Orders

## 2013-06-28 ENCOUNTER — Encounter: Payer: Self-pay | Admitting: Internal Medicine

## 2013-06-28 ENCOUNTER — Encounter: Payer: Self-pay | Admitting: Family Medicine

## 2013-06-28 LAB — FERRITIN: Ferritin: 94 ng/mL (ref 10–291)

## 2013-07-01 ENCOUNTER — Ambulatory Visit
Admission: RE | Admit: 2013-07-01 | Discharge: 2013-07-01 | Disposition: A | Discharge status: Home / Self Care | Payer: Medicare Other | Source: Ambulatory Visit | Attending: Family Medicine | Admitting: Family Medicine

## 2013-07-01 ENCOUNTER — Other Ambulatory Visit: Payer: Self-pay | Admitting: Family Medicine

## 2013-07-01 DIAGNOSIS — R05 Cough: Secondary | ICD-10-CM | POA: Diagnosis not present

## 2013-07-02 ENCOUNTER — Encounter: Payer: Self-pay | Admitting: Family Medicine

## 2013-07-03 ENCOUNTER — Other Ambulatory Visit: Payer: 59

## 2013-07-12 ENCOUNTER — Encounter: Payer: Self-pay | Admitting: Family Medicine

## 2013-07-12 DIAGNOSIS — D649 Anemia, unspecified: Secondary | ICD-10-CM | POA: Diagnosis not present

## 2013-07-14 NOTE — Telephone Encounter (Signed)
See e- mail for antibiotics.   [phone]

## 2013-07-23 ENCOUNTER — Encounter: Payer: Self-pay | Admitting: Internal Medicine

## 2013-07-23 ENCOUNTER — Ambulatory Visit (INDEPENDENT_AMBULATORY_CARE_PROVIDER_SITE_OTHER): Payer: Medicare Other | Admitting: Internal Medicine

## 2013-07-23 VITALS — BP 130/88 | HR 93 | Temp 98.2°F | Ht 64.0 in | Wt 279.4 lb

## 2013-07-23 DIAGNOSIS — E785 Hyperlipidemia, unspecified: Secondary | ICD-10-CM

## 2013-07-23 DIAGNOSIS — K219 Gastro-esophageal reflux disease without esophagitis: Secondary | ICD-10-CM

## 2013-07-23 DIAGNOSIS — E739 Lactose intolerance, unspecified: Secondary | ICD-10-CM | POA: Diagnosis not present

## 2013-07-23 DIAGNOSIS — D649 Anemia, unspecified: Secondary | ICD-10-CM

## 2013-07-23 DIAGNOSIS — E538 Deficiency of other specified B group vitamins: Secondary | ICD-10-CM | POA: Diagnosis not present

## 2013-07-23 DIAGNOSIS — E039 Hypothyroidism, unspecified: Secondary | ICD-10-CM | POA: Diagnosis not present

## 2013-07-23 DIAGNOSIS — I10 Essential (primary) hypertension: Secondary | ICD-10-CM

## 2013-07-23 MED ORDER — PANTOPRAZOLE SODIUM 40 MG PO TBEC: 40.0000 mg | DELAYED_RELEASE_TABLET | Freq: Every day | ORAL | Status: DC

## 2013-07-23 NOTE — Progress Notes (Signed)
Pre-visit discussion using our clinic review tool. No additional management support is needed unless otherwise documented below in the visit note.  

## 2013-07-23 NOTE — Assessment & Plan Note (Signed)
stable overall by history and exam, recent data reviewed with pt, and pt to continue medical treatment as before,  to f/u any worsening symptoms or concerns Lab Results  Component Value Date   HGBA1C 6.0 05/01/2012   For f/u a1c

## 2013-07-23 NOTE — Assessment & Plan Note (Addendum)
For f/u labs today, to f/u any worsening symptoms or concerns  Note:  Total time for pt hx, exam, review of record with pt in the room, determination of diagnoses and plan for further eval and tx is > 40 min, with over 50% spent in coordination and counseling of patient

## 2013-07-23 NOTE — Patient Instructions (Signed)
OK to change the prilosec to the protonix 40 mg per day Please continue all other medications as before, and refills have been done if requested. Please have the pharmacy call with any other refills you may need. Please continue your efforts at being more active, low cholesterol diet, and weight control. You are otherwise up to date with prevention measures today. Please go to the LAB in the Basement (turn left off the elevator) for the tests to be done today Please let us know about the actual dose of they thyroid medication you are taking  You will be contacted by phone if any changes need to be made immediately.  Otherwise, you will receive a letter about your results with an explanation, but please check with MyChart first.  Please remember to sign up for My Chart if you have not done so, as this will be important to you in the future with finding out test results, communicating by private email, and scheduling acute appointments online when needed.  Please return in 6 months, or sooner if needed
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
## 2013-07-23 NOTE — Assessment & Plan Note (Signed)
stable overall by history and exam, recent data reviewed with pt, and pt to continue medical treatment as before,  to f/u any worsening symptoms or concerns Lab Results  Component Value Date   LDLCALC 112* 05/01/2012

## 2013-07-23 NOTE — Assessment & Plan Note (Signed)
stable overall by history and exam, recent data reviewed with pt, and pt to continue medical treatment as before,  to f/u any worsening symptoms or concerns BP Readings from Last 3 Encounters:  07/23/13 130/88  06/26/13 130/88  04/18/13 120/86

## 2013-07-23 NOTE — Assessment & Plan Note (Signed)
For change prilosec to protonix 40 qd 

## 2013-07-24 ENCOUNTER — Other Ambulatory Visit (INDEPENDENT_AMBULATORY_CARE_PROVIDER_SITE_OTHER): Payer: Medicare Other

## 2013-07-24 ENCOUNTER — Encounter: Payer: Self-pay | Admitting: Internal Medicine

## 2013-07-24 DIAGNOSIS — E538 Deficiency of other specified B group vitamins: Secondary | ICD-10-CM

## 2013-07-24 DIAGNOSIS — E039 Hypothyroidism, unspecified: Secondary | ICD-10-CM | POA: Diagnosis not present

## 2013-07-24 DIAGNOSIS — D649 Anemia, unspecified: Secondary | ICD-10-CM

## 2013-07-24 LAB — HEPATIC FUNCTION PANEL
ALT: 21 U/L (ref 0–35)
AST: 22 U/L (ref 0–37)
Alkaline Phosphatase: 57 U/L (ref 39–117)
Bilirubin, Direct: 0.1 mg/dL (ref 0.0–0.3)
Total Bilirubin: 0.3 mg/dL (ref 0.3–1.2)

## 2013-07-24 LAB — BASIC METABOLIC PANEL
CO2: 28 mEq/L (ref 19–32)
Calcium: 9 mg/dL (ref 8.4–10.5)
Chloride: 104 mEq/L (ref 96–112)
GFR: 85.35 mL/min (ref >60.00)
Glucose, Bld: 248 mg/dL — ABNORMAL HIGH (ref 70–99)
Potassium: 3.7 mEq/L (ref 3.5–5.1)
Sodium: 138 mEq/L (ref 135–145)

## 2013-07-24 LAB — CBC WITH DIFFERENTIAL/PLATELET
Basophils Absolute: 0 10*3/uL (ref 0.0–0.1)
Eosinophils Relative: 2.5 % (ref 0.0–5.0)
HCT: 32.9 % — ABNORMAL LOW (ref 36.0–46.0)
Lymphocytes Relative: 43.1 % (ref 12.0–46.0)
Lymphs Abs: 3.9 10*3/uL (ref 0.7–4.0)
MCV: 72.6 fl — ABNORMAL LOW (ref 78.0–100.0)
Monocytes Absolute: 0.8 10*3/uL (ref 0.1–1.0)
Neutrophils Relative %: 45.3 % (ref 43.0–77.0)
Platelets: 212 10*3/uL (ref 150.0–400.0)
RDW: 14.6 % (ref 11.5–14.6)
WBC: 9 10*3/uL (ref 4.5–10.5)

## 2013-07-24 LAB — IBC PANEL
Iron: 66 ug/dL (ref 42–145)
Saturation Ratios: 16.4 % — ABNORMAL LOW (ref 20.0–50.0)
Transferrin: 288.3 mg/dL (ref 212.0–360.0)

## 2013-07-24 MED ORDER — LEVOTHYROXINE SODIUM 50 MCG PO TABS: ORAL_TABLET | ORAL | Status: DC

## 2013-07-24 MED ORDER — AZITHROMYCIN 250 MG PO TABS: ORAL_TABLET | ORAL | Status: DC

## 2013-07-25 ENCOUNTER — Encounter: Payer: Self-pay | Admitting: Internal Medicine

## 2013-07-25 ENCOUNTER — Ambulatory Visit: Payer: Medicare Other

## 2013-07-25 ENCOUNTER — Other Ambulatory Visit: Payer: Self-pay | Admitting: Internal Medicine

## 2013-07-25 DIAGNOSIS — R7309 Other abnormal glucose: Secondary | ICD-10-CM

## 2013-07-25 LAB — T4, FREE: Free T4: 0.8 ng/dL (ref 0.60–1.60)

## 2013-07-25 LAB — HEMOGLOBIN A1C: Hgb A1c MFr Bld: 8.3 % — ABNORMAL HIGH (ref 4.6–6.5)

## 2013-07-25 LAB — VITAMIN B12: Vitamin B-12: 317 pg/mL (ref 211–911)

## 2013-07-25 MED ORDER — METFORMIN HCL ER 500 MG PO TB24: 500.0000 mg | ORAL_TABLET | Freq: Every day | ORAL | Status: DC

## 2013-07-26 ENCOUNTER — Other Ambulatory Visit: Payer: Self-pay | Admitting: *Deleted

## 2013-07-26 ENCOUNTER — Encounter: Payer: Self-pay | Admitting: Internal Medicine

## 2013-07-26 ENCOUNTER — Telehealth: Payer: Self-pay | Admitting: *Deleted

## 2013-07-26 MED ORDER — ONETOUCH ULTRA 2 W/DEVICE KIT: 1.0000 "application " | PACK | Freq: Every day | Status: DC

## 2013-07-26 MED ORDER — METFORMIN HCL ER 500 MG PO TB24: 500.0000 mg | ORAL_TABLET | Freq: Every day | ORAL | Status: DC

## 2013-07-26 MED ORDER — GLUCOSE BLOOD VI STRP: ORAL_STRIP | Status: DC

## 2013-07-26 NOTE — Telephone Encounter (Signed)
rx sent, pt notified  

## 2013-07-26 NOTE — Telephone Encounter (Signed)
Pt called requesting glucose testing instruments/supplies due to new onset of DM.  Please advise

## 2013-07-29 ENCOUNTER — Encounter: Payer: Self-pay | Admitting: Internal Medicine

## 2013-08-09 ENCOUNTER — Encounter: Payer: Self-pay | Admitting: Internal Medicine

## 2013-08-12 ENCOUNTER — Encounter: Payer: Self-pay | Admitting: Internal Medicine

## 2013-08-12 DIAGNOSIS — IMO0001 Reserved for inherently not codable concepts without codable children: Secondary | ICD-10-CM

## 2013-08-12 DIAGNOSIS — E1165 Type 2 diabetes mellitus with hyperglycemia: Principal | ICD-10-CM

## 2013-08-13 NOTE — Addendum Note (Signed)
Addended by: Biagio Borg on: 08/13/2013 08:49 PM   Modules accepted: Orders

## 2013-08-22 ENCOUNTER — Encounter: Payer: Medicare Other | Attending: Internal Medicine

## 2013-08-22 DIAGNOSIS — E119 Type 2 diabetes mellitus without complications: Secondary | ICD-10-CM | POA: Insufficient documentation

## 2013-08-22 DIAGNOSIS — Z713 Dietary counseling and surveillance: Secondary | ICD-10-CM | POA: Diagnosis not present

## 2013-08-24 NOTE — Progress Notes (Signed)
Patient was seen on 08/22/13 for the first of a series of three diabetes self-management courses at the [facility].  Current HbA1c: 8.3%  The following learning objectives were met by the patient during this class:  Describe diabetes  State some common risk factors for diabetes  Defines the role of glucose and insulin  Identifies type of diabetes and pathophysiology  Describe the relationship between diabetes and cardiovascular risk  State the members of the Healthcare Team  States the rationale for glucose monitoring  State when to test glucose  State their individual Target Range  State the importance of logging glucose readings  Describe how to interpret glucose readings  Identifies A1C target  Explain the correlation between A1c and eAG values  State symptoms and treatment of high blood glucose  State symptoms and treatment of low blood glucose  Explain proper technique for glucose testing  Identifies proper sharps disposal  Handouts given during class include:  Living Well with Diabetes book  Carb Counting and Meal Planning book  Meal Plan Card  Carbohydrate guide  Meal planning worksheet  Low Sodium Flavoring Tips  The diabetes portion plate  Low Carbohydrate Snack Suggestions  A1c to eAG Conversion Chart  Diabetes Medications  Stress Management  Diabetes Recommended Care Schedule  Diabetes Success Plan  Core Class Satisfaction Survey  Follow-Up Plan:  Attend core 2

## 2013-09-09 ENCOUNTER — Encounter: Payer: Self-pay | Admitting: Internal Medicine

## 2013-09-10 ENCOUNTER — Other Ambulatory Visit: Payer: Self-pay

## 2013-09-10 ENCOUNTER — Telehealth: Payer: Self-pay | Admitting: *Deleted

## 2013-09-10 MED ORDER — ONETOUCH LANCETS MISC: Status: DC

## 2013-09-10 NOTE — Telephone Encounter (Signed)
Patient phoned stating that her diabetic supplies needed to be re-submitted with medicare diagnosis code.  States that Medicare is now her primary insurance provider and [employer] her secondary.  Am phoning pharmacy to clarify.  Spoke with Manuela Schwartz at [facility], stated they simply needed the lancets to be reordered to include the DM dx code.  Order resubmitted with 250.02

## 2013-09-11 ENCOUNTER — Encounter: Payer: Medicare Other | Attending: Internal Medicine

## 2013-09-11 DIAGNOSIS — E119 Type 2 diabetes mellitus without complications: Secondary | ICD-10-CM | POA: Insufficient documentation

## 2013-09-11 DIAGNOSIS — Z713 Dietary counseling and surveillance: Secondary | ICD-10-CM | POA: Diagnosis not present

## 2013-09-11 NOTE — Progress Notes (Signed)

## 2013-09-13 DIAGNOSIS — E119 Type 2 diabetes mellitus without complications: Secondary | ICD-10-CM

## 2013-09-17 NOTE — Progress Notes (Signed)
Patient was seen on 09/13/13 for the third of a series of three diabetes self-management courses at the [facility]. The following learning objectives were met by the patient during this class:    State the amount of activity recommended for healthy living   Describe activities suitable for individual needs   Identify ways to regularly incorporate activity into daily life   Identify barriers to activity and ways to over come these barriers  Identify diabetes medications being personally used and their primary action for lowering glucose and possible side effects   Describe role of stress on blood glucose and develop strategies to address psychosocial issues   Identify diabetes complications and ways to prevent them  Explain how to manage diabetes during illness   Evaluate success in meeting personal goal   Establish 2-3 goals that they will plan to diligently work on until they return for the  34-monthfollow-up visit  Goals:  Follow Diabetes Meal Plan as instructed  Aim for 15-30 mins of physical activity daily as tolerated  Bring food record and glucose log to your follow up visit  Your patient has established the following 4 month goals in their individualized success plan:  Count Carbs at most meals and snacks  Increase my activity at least 3 days a week for a goal of 150 minutes a week  Take my DM medications as scheduled  Look for patterns in my record book at least 2 days a month  To help manage my stress, I will walk at least 3 times a week  Your patient has identified these potential barriers to change:  None noted  Your patient has identified their diabetes self-care support plan as  [facility]

## 2013-09-23 ENCOUNTER — Encounter: Payer: Self-pay | Admitting: Internal Medicine

## 2013-09-25 ENCOUNTER — Other Ambulatory Visit: Payer: Self-pay

## 2013-09-25 MED ORDER — GLUCOSE BLOOD VI STRP: ORAL_STRIP | Status: DC

## 2013-09-27 ENCOUNTER — Ambulatory Visit (INDEPENDENT_AMBULATORY_CARE_PROVIDER_SITE_OTHER): Payer: Medicare Other | Admitting: Family Medicine

## 2013-09-27 VITALS — BP 149/96 | HR 84 | Temp 98.3°F | Resp 16 | Ht 63.5 in | Wt 268.0 lb

## 2013-09-27 DIAGNOSIS — A084 Viral intestinal infection, unspecified: Secondary | ICD-10-CM

## 2013-09-27 DIAGNOSIS — A088 Other specified intestinal infections: Secondary | ICD-10-CM | POA: Diagnosis not present

## 2013-09-27 DIAGNOSIS — E119 Type 2 diabetes mellitus without complications: Secondary | ICD-10-CM | POA: Diagnosis not present

## 2013-09-27 LAB — COMPREHENSIVE METABOLIC PANEL
ALBUMIN: 4.1 g/dL (ref 3.5–5.2)
ALT: 23 U/L (ref 0–35)
AST: 27 U/L (ref 0–37)
Alkaline Phosphatase: 48 U/L (ref 39–117)
BUN: 17 mg/dL (ref 6–23)
CO2: 27 meq/L (ref 19–32)
Calcium: 8.9 mg/dL (ref 8.4–10.5)
Chloride: 103 mEq/L (ref 96–112)
Creat: 1 mg/dL (ref 0.50–1.10)
GLUCOSE: 100 mg/dL — AB (ref 70–99)
Potassium: 3.8 mEq/L (ref 3.5–5.3)
SODIUM: 139 meq/L (ref 135–145)
Total Bilirubin: 0.3 mg/dL (ref 0.2–1.2)
Total Protein: 7.1 g/dL (ref 6.0–8.3)

## 2013-09-27 LAB — POCT CBC
Granulocyte percent: 41.4 %G (ref 37–80)
HCT, POC: 38.8 % (ref 37.7–47.9)
HEMOGLOBIN: 11.4 g/dL — AB (ref 12.2–16.2)
Lymph, poc: 3.3 (ref 0.6–3.4)
MCH: 22.8 pg — AB (ref 27–31.2)
MCHC: 29.4 g/dL — AB (ref 31.8–35.4)
MCV: 77.8 fL — AB (ref 80–97)
MID (cbc): 0.5 (ref 0–0.9)
MPV: 9.4 fL (ref 0–99.8)
POC Granulocyte: 2.6 (ref 2–6.9)
POC LYMPH PERCENT: 51.3 %L — AB (ref 10–50)
POC MID %: 7.3 %M (ref 0–12)
Platelet Count, POC: 268 10*3/uL (ref 142–424)
RBC: 4.99 M/uL (ref 4.04–5.48)
RDW, POC: 14.9 %
WBC: 6.4 10*3/uL (ref 4.6–10.2)

## 2013-09-27 LAB — GLUCOSE, POCT (MANUAL RESULT ENTRY): POC Glucose: 94 mg/dl (ref 70–99)

## 2013-09-27 LAB — POCT GLYCOSYLATED HEMOGLOBIN (HGB A1C): Hemoglobin A1C: 6.1
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
## 2013-09-27 NOTE — Patient Instructions (Signed)
Rest, and drink plenty of water.  I will be in touch with the rest of your labs.   Your diabetes is now under good control- congratulations!  Continue working on your weight and take your medications.   Assuming your electrolytes are looking good you can start back on your HCTZ

## 2013-09-28 ENCOUNTER — Encounter: Payer: Self-pay | Admitting: Family Medicine

## 2013-09-28 DIAGNOSIS — D649 Anemia, unspecified: Secondary | ICD-10-CM

## 2013-10-02 NOTE — Telephone Encounter (Signed)
Couldn't add test on because purple top wasn't sent

## 2013-10-03 ENCOUNTER — Encounter: Payer: Self-pay | Admitting: Family Medicine

## 2013-10-09 ENCOUNTER — Other Ambulatory Visit (INDEPENDENT_AMBULATORY_CARE_PROVIDER_SITE_OTHER): Payer: Medicare Other | Admitting: *Deleted

## 2013-10-09 DIAGNOSIS — D649 Anemia, unspecified: Secondary | ICD-10-CM

## 2013-10-11 LAB — HEMOGLOBINOPATHY EVALUATION
HGB A2 QUANT: 2 % — AB (ref 2.2–3.2)
Hemoglobin Other: 0 %
Hgb A: 98 % — ABNORMAL HIGH (ref 96.8–97.8)
Hgb F Quant: 0 % (ref 0.0–2.0)
Hgb S Quant: 0 %

## 2013-10-17 ENCOUNTER — Other Ambulatory Visit: Payer: Self-pay

## 2013-10-17 MED ORDER — GLUCOSE BLOOD VI STRP: ORAL_STRIP | Status: DC

## 2013-12-04 ENCOUNTER — Ambulatory Visit (INDEPENDENT_AMBULATORY_CARE_PROVIDER_SITE_OTHER): Payer: Medicare Other | Admitting: Family Medicine

## 2013-12-04 VITALS — BP 144/96 | HR 95 | Temp 98.9°F | Resp 17 | Ht 64.5 in | Wt 267.0 lb

## 2013-12-04 DIAGNOSIS — R51 Headache: Secondary | ICD-10-CM

## 2013-12-04 DIAGNOSIS — K0889 Other specified disorders of teeth and supporting structures: Secondary | ICD-10-CM

## 2013-12-04 DIAGNOSIS — R519 Headache, unspecified: Secondary | ICD-10-CM

## 2013-12-04 DIAGNOSIS — K089 Disorder of teeth and supporting structures, unspecified: Secondary | ICD-10-CM | POA: Diagnosis not present

## 2013-12-04 MED ORDER — HYDROCODONE-ACETAMINOPHEN 5-325 MG PO TABS: 1.0000 | ORAL_TABLET | Freq: Three times a day (TID) | ORAL | Status: DC | PRN

## 2013-12-04 MED ORDER — AMOXICILLIN-POT CLAVULANATE 875-125 MG PO TABS: 1.0000 | ORAL_TABLET | Freq: Two times a day (BID) | ORAL | Status: DC
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
## 2013-12-04 NOTE — Patient Instructions (Signed)
Change to the Augmentin antibiotic- take it twice a day as directed.  Use the pain medication as needed for pain- remember it can make you sleepy.    You can hang on to the clindamycin- I do not think that the swelling you have is due to an allergic reaction

## 2013-12-05 ENCOUNTER — Encounter: Payer: Self-pay | Admitting: Family Medicine

## 2013-12-06 ENCOUNTER — Ambulatory Visit (INDEPENDENT_AMBULATORY_CARE_PROVIDER_SITE_OTHER): Payer: Medicare Other | Admitting: Family Medicine

## 2013-12-06 VITALS — BP 128/78 | HR 81 | Temp 98.5°F | Resp 14 | Ht 64.0 in | Wt 262.2 lb

## 2013-12-06 DIAGNOSIS — K05219 Aggressive periodontitis, localized, unspecified severity: Secondary | ICD-10-CM

## 2013-12-06 DIAGNOSIS — K0520 Aggressive periodontitis, unspecified: Secondary | ICD-10-CM | POA: Diagnosis not present
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
## 2013-12-16 ENCOUNTER — Encounter: Payer: Self-pay | Admitting: Family Medicine

## 2014-01-06 ENCOUNTER — Encounter: Payer: Medicare Other | Attending: Internal Medicine

## 2014-01-06 DIAGNOSIS — Z713 Dietary counseling and surveillance: Secondary | ICD-10-CM | POA: Diagnosis not present

## 2014-01-06 DIAGNOSIS — E119 Type 2 diabetes mellitus without complications: Secondary | ICD-10-CM

## 2014-01-06 NOTE — Progress Notes (Signed)
Patient was seen on 01/06/2014 for a review of the series of three diabetes self-management courses at the [facility]. The following learning objectives were met by the patient during this class:    Reviewed blood glucose monitoring and interpretation including the recommended target ranges and Hgb A1c.    Reviewed on carb counting, importance of regularly scheduled meals/snacks, and meal planning.    Reviewed the effects of physical activity on glucose levels and long-term glucose control.  Recommended goal of 150 minutes of physical activity/week.   Reviewed patient medications and discussed role of medication on blood glucose and possible side effects.   Discussed strategies to manage stress, psychosocial issues, and other obstacles to diabetes management.   Encouraged moderate weight reduction to improve glucose levels.     Reviewed short-term complications: hyper- and hypo-glycemia.  Discussed causes, symptoms, and treatment options.   Reviewed prevention, detection, and treatment of long-term complications.  Discussed the role of prolonged elevated glucose levels on body systems.  Goals:  Follow Diabetes Meal Plan as instructed  Eat 3 meals and 2 snacks, every 3-5 hrs  Limit carbohydrate intake to 45 grams carbohydrate/meal Limit carbohydrate intake to 15 grams carbohydrate/snack Add lean protein foods to meals/snacks  Monitor glucose levels as instructed by your doctor  Aim for goal of 15-30 mins of physical activity daily as tolerated  Bring food record and glucose log to your next nutrition visit   

## 2014-01-10 ENCOUNTER — Ambulatory Visit: Payer: Medicare Other

## 2014-01-10 ENCOUNTER — Ambulatory Visit (INDEPENDENT_AMBULATORY_CARE_PROVIDER_SITE_OTHER): Payer: Medicare Other | Admitting: Family Medicine

## 2014-01-10 VITALS — BP 142/86 | HR 74 | Temp 98.3°F | Resp 16 | Ht 64.0 in | Wt 260.8 lb

## 2014-01-10 DIAGNOSIS — M25569 Pain in unspecified knee: Secondary | ICD-10-CM | POA: Diagnosis not present

## 2014-01-10 DIAGNOSIS — I10 Essential (primary) hypertension: Secondary | ICD-10-CM

## 2014-01-10 DIAGNOSIS — M25562 Pain in left knee: Secondary | ICD-10-CM

## 2014-01-10 MED ORDER — LISINOPRIL 10 MG PO TABS: 10.0000 mg | ORAL_TABLET | Freq: Every day | ORAL | Status: DC
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
## 2014-01-10 NOTE — Patient Instructions (Addendum)
Use the knee brace and NSAID medication (such as ibuprofen) as needed.  If your knee continues to bother you it would be best to follow-up with your orthopedist as you may need an MRI again.  Let me know if you have any trouble in this regard!   Start taking lisinopril 10 mg- however start with just a 1/2 tablet (5mg ).  In a couple of weeks please email me and let me know how your BP looks.   Please come and see me in about one month for blood work

## 2014-01-12 ENCOUNTER — Encounter: Payer: Self-pay | Admitting: Family Medicine

## 2014-01-14 ENCOUNTER — Other Ambulatory Visit: Payer: Self-pay | Admitting: Internal Medicine

## 2014-01-16 DIAGNOSIS — Z5189 Encounter for other specified aftercare: Secondary | ICD-10-CM | POA: Diagnosis not present

## 2014-03-21 ENCOUNTER — Encounter: Payer: Self-pay | Admitting: Internal Medicine

## 2014-03-21 ENCOUNTER — Other Ambulatory Visit (INDEPENDENT_AMBULATORY_CARE_PROVIDER_SITE_OTHER): Payer: Medicare Other

## 2014-03-21 ENCOUNTER — Ambulatory Visit (INDEPENDENT_AMBULATORY_CARE_PROVIDER_SITE_OTHER): Payer: 59 | Admitting: Internal Medicine

## 2014-03-21 VITALS — BP 130/90 | HR 88 | Temp 98.3°F | Ht 64.0 in | Wt 263.2 lb

## 2014-03-21 DIAGNOSIS — E669 Obesity, unspecified: Secondary | ICD-10-CM

## 2014-03-21 DIAGNOSIS — Z0000 Encounter for general adult medical examination without abnormal findings: Secondary | ICD-10-CM | POA: Diagnosis not present

## 2014-03-21 DIAGNOSIS — IMO0001 Reserved for inherently not codable concepts without codable children: Secondary | ICD-10-CM

## 2014-03-21 DIAGNOSIS — E785 Hyperlipidemia, unspecified: Secondary | ICD-10-CM

## 2014-03-21 DIAGNOSIS — E1165 Type 2 diabetes mellitus with hyperglycemia: Principal | ICD-10-CM

## 2014-03-21 DIAGNOSIS — I10 Essential (primary) hypertension: Secondary | ICD-10-CM

## 2014-03-21 LAB — URINALYSIS, ROUTINE W REFLEX MICROSCOPIC
BILIRUBIN URINE: NEGATIVE
Hgb urine dipstick: NEGATIVE
KETONES UR: NEGATIVE
Leukocytes, UA: NEGATIVE
NITRITE: NEGATIVE
Specific Gravity, Urine: 1.025 (ref 1.000–1.030)
TOTAL PROTEIN, URINE-UPE24: NEGATIVE
URINE GLUCOSE: NEGATIVE
Urobilinogen, UA: 0.2 (ref 0.0–1.0)
pH: 5.5 (ref 5.0–8.0)

## 2014-03-21 LAB — CBC WITH DIFFERENTIAL/PLATELET
BASOS PCT: 0.6 % (ref 0.0–3.0)
Basophils Absolute: 0 10*3/uL (ref 0.0–0.1)
EOS ABS: 0.2 10*3/uL (ref 0.0–0.7)
Eosinophils Relative: 3.1 % (ref 0.0–5.0)
HEMATOCRIT: 36 % (ref 36.0–46.0)
Hemoglobin: 11.2 g/dL — ABNORMAL LOW (ref 12.0–15.0)
LYMPHS ABS: 2.9 10*3/uL (ref 0.7–4.0)
Lymphocytes Relative: 45.2 % (ref 12.0–46.0)
MCHC: 31.1 g/dL (ref 30.0–36.0)
MCV: 73.1 fl — AB (ref 78.0–100.0)
Monocytes Absolute: 0.6 10*3/uL (ref 0.1–1.0)
Monocytes Relative: 8.8 % (ref 3.0–12.0)
NEUTROS PCT: 42.3 % — AB (ref 43.0–77.0)
Neutro Abs: 2.7 10*3/uL (ref 1.4–7.7)
Platelets: 239 10*3/uL (ref 150.0–400.0)
RBC: 4.93 Mil/uL (ref 3.87–5.11)
RDW: 15.3 % (ref 11.5–15.5)
WBC: 6.5 10*3/uL (ref 4.0–10.5)

## 2014-03-21 LAB — BASIC METABOLIC PANEL
BUN: 17 mg/dL (ref 6–23)
CHLORIDE: 103 meq/L (ref 96–112)
CO2: 30 mEq/L (ref 19–32)
CREATININE: 1 mg/dL (ref 0.4–1.2)
Calcium: 10 mg/dL (ref 8.4–10.5)
GFR: 71.81 mL/min (ref >60.00)
Glucose, Bld: 102 mg/dL — ABNORMAL HIGH (ref 70–99)
POTASSIUM: 3.8 meq/L (ref 3.5–5.1)
Sodium: 137 mEq/L (ref 135–145)

## 2014-03-21 LAB — HEPATIC FUNCTION PANEL
ALT: 18 U/L (ref 0–35)
AST: 19 U/L (ref 0–37)
Albumin: 3.9 g/dL (ref 3.5–5.2)
Alkaline Phosphatase: 48 U/L (ref 39–117)
BILIRUBIN TOTAL: 0.6 mg/dL (ref 0.2–1.2)
Bilirubin, Direct: 0 mg/dL (ref 0.0–0.3)
Total Protein: 7.4 g/dL (ref 6.0–8.3)

## 2014-03-21 LAB — MICROALBUMIN / CREATININE URINE RATIO
CREATININE, U: 241.5 mg/dL
MICROALB/CREAT RATIO: 0.5 mg/g (ref 0.0–30.0)
Microalb, Ur: 1.1 mg/dL (ref 0.0–1.9)

## 2014-03-21 LAB — LIPID PANEL
CHOL/HDL RATIO: 5
Cholesterol: 178 mg/dL (ref 0–200)
HDL: 38 mg/dL — ABNORMAL LOW (ref >39.00)
LDL Cholesterol: 111 mg/dL — ABNORMAL HIGH (ref 0–99)
NONHDL: 140
Triglycerides: 146 mg/dL (ref 0.0–149.0)
VLDL: 29.2 mg/dL (ref 0.0–40.0)

## 2014-03-21 LAB — HEMOGLOBIN A1C: Hgb A1c MFr Bld: 6 % (ref 4.6–6.5)

## 2014-03-21 LAB — TSH: TSH: 3.08 u[IU]/mL (ref 0.35–4.50)

## 2014-03-21 NOTE — Progress Notes (Signed)
Pre visit review using our clinic review tool, if applicable. No additional management support is needed unless otherwise documented below in the visit note. 

## 2014-03-21 NOTE — Patient Instructions (Signed)
OK to increase the lisinopril to 20 mg per day  Please continue all other medications as before, and refills have been done if requested.  Please have the pharmacy call with any other refills you may need.  Please continue your efforts at being more active, low cholesterol diet, and weight control.  You are otherwise up to date with prevention measures today.  Please keep your appointments with your specialists as you may have planned  Please go to the LAB in the Basement (turn left off the elevator) for the tests to be done today  You will be contacted by phone if any changes need to be made immediately.  Otherwise, you will receive a letter about your results with an explanation, but please check with MyChart first.  Please remember to sign up for MyChart if you have not done so, as this will be important to you in the future with finding out test results, communicating by private email, and scheduling acute appointments online when needed.  Please return in 6 months, or sooner if needed, with Lab testing done 3-5 days before
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
## 2014-03-22 NOTE — Assessment & Plan Note (Signed)

## 2014-03-22 NOTE — Assessment & Plan Note (Addendum)
stable overall by history and exam, recent data reviewed with pt, and pt for lisinopril 20 qd,  to f/u any worsening symptoms or concerns. BP Readings from Last 3 Encounters:  03/21/14 130/90  01/10/14 142/86  12/06/13 128/78

## 2014-03-22 NOTE — Assessment & Plan Note (Addendum)
stable overall by history and exam, recent data reviewed with pt, and pt to continue medical treatment as before,  to f/u any worsening symptoms or concerns Lab Results  Component Value Date   LDLCALC 111* 03/21/2014  cont lower chol diet, consider statin, goal dll < 70

## 2014-03-22 NOTE — Assessment & Plan Note (Signed)
stable overall by history and exam, recent data reviewed with pt, and pt to continue medical treatment as before,  to f/u any worsening symptoms or concerns Lab Results  Component Value Date   HGBA1C 6.0 03/21/2014

## 2014-03-22 NOTE — Assessment & Plan Note (Signed)
Pt to consider bariatric surgury

## 2014-03-24 ENCOUNTER — Other Ambulatory Visit: Payer: Self-pay | Admitting: Internal Medicine

## 2014-03-24 MED ORDER — ATORVASTATIN CALCIUM 10 MG PO TABS: 10.0000 mg | ORAL_TABLET | Freq: Every day | ORAL | Status: DC

## 2014-03-25 ENCOUNTER — Encounter: Payer: Self-pay | Admitting: Family Medicine

## 2014-03-25 ENCOUNTER — Telehealth: Payer: Self-pay

## 2014-03-25 NOTE — Telephone Encounter (Signed)
Patient informed. 

## 2014-03-25 NOTE — Telephone Encounter (Signed)
Chart reviewed, This should be ok in her case, and other med options actually have other even worse risk, such as narcotics with risk of dependence

## 2014-03-25 NOTE — Telephone Encounter (Signed)
The patient was told by her pharmacist that mobic taken over a long period of time can affect the kidneys and liver.  Advise if ok for her to continue on for arthritis pain, or offer safer alternative

## 2014-04-07 ENCOUNTER — Encounter: Payer: Medicare Other | Attending: Internal Medicine | Admitting: Dietician

## 2014-04-07 VITALS — Ht 64.0 in | Wt 262.5 lb

## 2014-04-07 DIAGNOSIS — E119 Type 2 diabetes mellitus without complications: Secondary | ICD-10-CM | POA: Diagnosis not present

## 2014-04-07 DIAGNOSIS — Z713 Dietary counseling and surveillance: Secondary | ICD-10-CM | POA: Insufficient documentation

## 2014-04-07 DIAGNOSIS — E669 Obesity, unspecified: Secondary | ICD-10-CM

## 2014-04-07 NOTE — Patient Instructions (Addendum)
-  Practice pre op goals -Try protein shakes  [email or web address] (fill out registration page)

## 2014-04-07 NOTE — Progress Notes (Signed)
  Supervised weight loss:  Appt start time: 315 end time:  330.  SWL visit 1:  Primary concerns today: Elizabeth Hawkins is here today for her 1st SWL visit out of 6 in preparation for gastric sleeve surgery. She plans to meet with her surgeon and have a nutrition assessment at [facility]. Today we discussed pre op goals and importance of protein following bariatric surgery.   MEDICATIONS: see list  Estimated energy needs: 1400-1600 calories 158-180 g carbohydrates 105-120 g protein 39-44 g fat  Progress Towards Goal(s):  In progress.   Nutritional Diagnosis:  [place]-3.3 Overweight/obesity related to past poor dietary habits and physical inactivity as evidenced by patient in SWL for pending bariatric surgery following dietary guidelines for continued weight loss.     Intervention:  Nutrition counseling provided.  Handouts given during visit include:  Pre op goals  Protein shakes  Monitoring/Evaluation:  Dietary intake, exercise, pre op goals, and body weight in 4 week(s).

## 2014-04-28 ENCOUNTER — Encounter (HOSPITAL_COMMUNITY): Payer: Self-pay | Admitting: Emergency Medicine

## 2014-04-28 ENCOUNTER — Ambulatory Visit (INDEPENDENT_AMBULATORY_CARE_PROVIDER_SITE_OTHER): Payer: Medicare Other

## 2014-04-28 ENCOUNTER — Emergency Department (HOSPITAL_COMMUNITY): Payer: Medicare Other

## 2014-04-28 ENCOUNTER — Ambulatory Visit (INDEPENDENT_AMBULATORY_CARE_PROVIDER_SITE_OTHER): Payer: Medicare Other | Admitting: Family Medicine

## 2014-04-28 ENCOUNTER — Emergency Department (HOSPITAL_COMMUNITY)
Admission: EM | Admit: 2014-04-28 | Discharge: 2014-04-29 | Disposition: A | Discharge status: Home / Self Care | Payer: Medicare Other | Attending: Emergency Medicine | Admitting: Emergency Medicine

## 2014-04-28 VITALS — BP 128/86 | HR 76 | Temp 97.9°F | Resp 18 | Ht 64.0 in | Wt 264.4 lb

## 2014-04-28 DIAGNOSIS — R05 Cough: Secondary | ICD-10-CM | POA: Diagnosis not present

## 2014-04-28 DIAGNOSIS — F411 Generalized anxiety disorder: Secondary | ICD-10-CM | POA: Insufficient documentation

## 2014-04-28 DIAGNOSIS — Z79899 Other long term (current) drug therapy: Secondary | ICD-10-CM | POA: Insufficient documentation

## 2014-04-28 DIAGNOSIS — F3289 Other specified depressive episodes: Secondary | ICD-10-CM | POA: Diagnosis not present

## 2014-04-28 DIAGNOSIS — E669 Obesity, unspecified: Secondary | ICD-10-CM | POA: Insufficient documentation

## 2014-04-28 DIAGNOSIS — R059 Cough, unspecified: Secondary | ICD-10-CM | POA: Diagnosis not present

## 2014-04-28 DIAGNOSIS — R079 Chest pain, unspecified: Secondary | ICD-10-CM | POA: Diagnosis not present

## 2014-04-28 DIAGNOSIS — R062 Wheezing: Secondary | ICD-10-CM | POA: Diagnosis not present

## 2014-04-28 DIAGNOSIS — K219 Gastro-esophageal reflux disease without esophagitis: Secondary | ICD-10-CM | POA: Insufficient documentation

## 2014-04-28 DIAGNOSIS — R0789 Other chest pain: Secondary | ICD-10-CM | POA: Insufficient documentation

## 2014-04-28 DIAGNOSIS — I10 Essential (primary) hypertension: Secondary | ICD-10-CM | POA: Diagnosis not present

## 2014-04-28 DIAGNOSIS — Z860100 Personal history of colon polyps, unspecified: Secondary | ICD-10-CM | POA: Insufficient documentation

## 2014-04-28 DIAGNOSIS — F329 Major depressive disorder, single episode, unspecified: Secondary | ICD-10-CM | POA: Insufficient documentation

## 2014-04-28 DIAGNOSIS — R0602 Shortness of breath: Secondary | ICD-10-CM | POA: Diagnosis not present

## 2014-04-28 DIAGNOSIS — Z87891 Personal history of nicotine dependence: Secondary | ICD-10-CM | POA: Diagnosis not present

## 2014-04-28 DIAGNOSIS — M129 Arthropathy, unspecified: Secondary | ICD-10-CM | POA: Insufficient documentation

## 2014-04-28 DIAGNOSIS — J45901 Unspecified asthma with (acute) exacerbation: Secondary | ICD-10-CM | POA: Diagnosis not present

## 2014-04-28 DIAGNOSIS — E785 Hyperlipidemia, unspecified: Secondary | ICD-10-CM | POA: Diagnosis not present

## 2014-04-28 DIAGNOSIS — Z862 Personal history of diseases of the blood and blood-forming organs and certain disorders involving the immune mechanism: Secondary | ICD-10-CM | POA: Diagnosis not present

## 2014-04-28 DIAGNOSIS — E119 Type 2 diabetes mellitus without complications: Secondary | ICD-10-CM | POA: Diagnosis not present

## 2014-04-28 LAB — BASIC METABOLIC PANEL
ANION GAP: 13 (ref 5–15)
BUN: 13 mg/dL (ref 6–23)
CALCIUM: 8.9 mg/dL (ref 8.4–10.5)
CHLORIDE: 103 meq/L (ref 96–112)
CO2: 24 mEq/L (ref 19–32)
Creatinine, Ser: 0.86 mg/dL (ref 0.50–1.10)
GFR calc Af Amer: 85 mL/min — ABNORMAL LOW (ref >90)
GFR calc non Af Amer: 74 mL/min — ABNORMAL LOW (ref >90)
Glucose, Bld: 104 mg/dL — ABNORMAL HIGH (ref 70–99)
Potassium: 3.6 mEq/L — ABNORMAL LOW (ref 3.7–5.3)
SODIUM: 140 meq/L (ref 137–147)

## 2014-04-28 LAB — CBC
HCT: 34.3 % — ABNORMAL LOW (ref 36.0–46.0)
HEMOGLOBIN: 10.7 g/dL — AB (ref 12.0–15.0)
MCH: 22.4 pg — ABNORMAL LOW (ref 26.0–34.0)
MCHC: 31.2 g/dL (ref 30.0–36.0)
MCV: 71.9 fL — ABNORMAL LOW (ref 78.0–100.0)
PLATELETS: 218 10*3/uL (ref 150–400)
RBC: 4.77 MIL/uL (ref 3.87–5.11)
RDW: 14.2 % (ref 11.5–15.5)
WBC: 6.2 10*3/uL (ref 4.0–10.5)

## 2014-04-28 LAB — I-STAT TROPONIN, ED
Troponin i, poc: 0 ng/mL (ref 0.00–0.08)
Troponin i, poc: 0 ng/mL (ref 0.00–0.08)

## 2014-04-28 LAB — PRO B NATRIURETIC PEPTIDE: PRO B NATRI PEPTIDE: 31.6 pg/mL (ref 0–125)

## 2014-04-28 MED ORDER — GI COCKTAIL ~~LOC~~
30.0000 mL | Freq: Once | ORAL | Status: AC
  Administered 2014-04-28: 30 mL via ORAL

## 2014-04-28 MED ORDER — ALBUTEROL SULFATE HFA 108 (90 BASE) MCG/ACT IN AERS: 2.0000 | INHALATION_SPRAY | Freq: Four times a day (QID) | RESPIRATORY_TRACT | Status: DC | PRN

## 2014-04-28 MED ORDER — PREDNISONE 20 MG PO TABS: 40.0000 mg | ORAL_TABLET | Freq: Every day | ORAL | Status: DC

## 2014-04-28 MED ORDER — ALBUTEROL SULFATE (2.5 MG/3ML) 0.083% IN NEBU
5.0000 mg | INHALATION_SOLUTION | Freq: Once | RESPIRATORY_TRACT | Status: AC
  Administered 2014-04-28: 5 mg via RESPIRATORY_TRACT
  Filled 2014-04-28: qty 6

## 2014-04-28 MED ORDER — ASPIRIN 325 MG PO TABS: 325.0000 mg | ORAL_TABLET | Freq: Once | ORAL | Status: DC

## 2014-04-28 MED ORDER — IPRATROPIUM BROMIDE 0.02 % IN SOLN
0.5000 mg | Freq: Once | RESPIRATORY_TRACT | Status: AC
  Administered 2014-04-28: 0.5 mg via RESPIRATORY_TRACT
  Filled 2014-04-28: qty 2.5

## 2014-04-28 MED ORDER — ALBUTEROL SULFATE (2.5 MG/3ML) 0.083% IN NEBU
2.5000 mg | INHALATION_SOLUTION | Freq: Once | RESPIRATORY_TRACT | Status: AC
  Administered 2014-04-28: 2.5 mg via RESPIRATORY_TRACT

## 2014-04-28 MED ORDER — ALBUTEROL SULFATE HFA 108 (90 BASE) MCG/ACT IN AERS
2.0000 | INHALATION_SPRAY | RESPIRATORY_TRACT | Status: DC | PRN
  Filled 2014-04-28: qty 6.7
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
## 2014-04-28 NOTE — Discharge Instructions (Signed)
Bronchospasm A bronchospasm is when the tubes that carry air in and out of your lungs (airways) spasm or tighten. During a bronchospasm it is hard to breathe. This is because the airways get smaller. A bronchospasm can be triggered by:  Allergies. These may be to animals, pollen, food, or mold.  Infection. This is a common cause of bronchospasm.  Exercise.  Irritants. These include pollution, cigarette smoke, strong odors, aerosol sprays, and paint fumes.  Weather changes.  Stress.  Being emotional. HOME CARE   Always have a plan for getting help. Know when to call your doctor and local emergency services (911 in the [place] Know where you can get emergency care.  Only take medicines as told by your doctor.  If you were prescribed an inhaler or nebulizer machine, ask your doctor how to use it correctly. Always use a spacer with your inhaler if you were given one.  Stay calm during an attack. Try to relax and breathe more slowly.  Control your home environment:  Change your heating and air conditioning filter at least once a month.  Limit your use of fireplaces and wood stoves.  Do not  smoke. Do not  allow smoking in your home.  Avoid perfumes and fragrances.  Get rid of pests (such as roaches and mice) and their droppings.  Throw away plants if you see mold on them.  Keep your house clean and dust free.  Replace carpet with wood, tile, or vinyl flooring. Carpet can trap dander and dust.  Use allergy-proof pillows, mattress covers, and box spring covers.  Wash bed sheets and blankets every week in hot water. Dry them in a dryer.  Use blankets that are made of polyester or cotton.  Wash hands frequently. GET HELP IF:  You have muscle aches.  You have chest pain.  The thick spit you spit or cough up (sputum) changes from clear or [place] to yellow, green, gray, or bloody.  The thick spit you spit or cough up gets thicker.  There are problems that may be related  to the medicine you are given such as:  A rash.  Itching.  Swelling.  Trouble breathing. GET HELP RIGHT AWAY IF:  You feel you cannot breathe or catch your breath.  You cannot stop coughing.  Your treatment is not helping you breathe better.  You have very bad chest pain. MAKE SURE YOU:   Understand these instructions.  Will watch your condition.  Will get help right away if you are not doing well or get worse. Document Released: 05/22/2009 Document Revised: 07/30/2013 Document Reviewed: 01/15/2013 [facility] Patient Information 2015 [place], [place]. This information is not intended to replace advice given to you by your health care provider. Make sure you discuss any questions you have with your health care provider.  Chest Pain (Nonspecific) It is often hard to give a specific diagnosis for the cause of chest pain. There is always a chance that your pain could be related to something serious, such as a heart attack or a blood clot in the lungs. You need to follow up with your health care provider for further evaluation. CAUSES   Heartburn.  Pneumonia or bronchitis.  Anxiety or stress.  Inflammation around your heart (pericarditis) or lung (pleuritis or pleurisy).  A blood clot in the lung.  A collapsed lung (pneumothorax). It can develop suddenly on its own (spontaneous pneumothorax) or from trauma to the chest.  Shingles infection (herpes zoster virus). The chest wall is composed of  bones, muscles, and cartilage. Any of these can be the source of the pain.  The bones can be bruised by injury.  The muscles or cartilage can be strained by coughing or overwork.  The cartilage can be affected by inflammation and become sore (costochondritis). DIAGNOSIS  Lab tests or other studies may be needed to find the cause of your pain. Your health care provider may have you take a test called an ambulatory electrocardiogram (ECG). An ECG records your heartbeat patterns over a  24-hour period. You may also have other tests, such as:  Transthoracic echocardiogram (TTE). During echocardiography, sound waves are used to evaluate how blood flows through your heart.  Transesophageal echocardiogram (TEE).  Cardiac monitoring. This allows your health care provider to monitor your heart rate and rhythm in real time.  Holter monitor. This is a portable device that records your heartbeat and can help diagnose heart arrhythmias. It allows your health care provider to track your heart activity for several days, if needed.  Stress tests by exercise or by giving medicine that makes the heart beat faster. TREATMENT   Treatment depends on what may be causing your chest pain. Treatment may include:  Acid blockers for heartburn.  Anti-inflammatory medicine.  Pain medicine for inflammatory conditions.  Antibiotics if an infection is present.  You may be advised to change lifestyle habits. This includes stopping smoking and avoiding alcohol, caffeine, and chocolate.  You may be advised to keep your head raised (elevated) when sleeping. This reduces the chance of acid going backward from your stomach into your esophagus. Most of the time, nonspecific chest pain will improve within 2-3 days with rest and mild pain medicine.  HOME CARE INSTRUCTIONS   If antibiotics were prescribed, take them as directed. Finish them even if you start to feel better.  For the next few days, avoid physical activities that bring on chest pain. Continue physical activities as directed.  Do not use any tobacco products, including cigarettes, chewing tobacco, or electronic cigarettes.  Avoid drinking alcohol.  Only take medicine as directed by your health care provider.  Follow your health care provider's suggestions for further testing if your chest pain does not go away.  Keep any follow-up appointments you made. If you do not go to an appointment, you could develop lasting (chronic) problems  with pain. If there is any problem keeping an appointment, call to reschedule. SEEK MEDICAL CARE IF:   Your chest pain does not go away, even after treatment.  You have a rash with blisters on your chest.  You have a fever. SEEK IMMEDIATE MEDICAL CARE IF:   You have increased chest pain or pain that spreads to your arm, neck, jaw, back, or abdomen.  You have shortness of breath.  You have an increasing cough, or you cough up blood.  You have severe back or abdominal pain.  You feel nauseous or vomit.  You have severe weakness.  You faint.  You have chills. This is an emergency. Do not wait to see if the pain will go away. Get medical help at once. Call your local emergency services (911 in [place] Do not drive yourself to the hospital. MAKE SURE YOU:   Understand these instructions.  Will watch your condition.  Will get help right away if you are not doing well or get worse. Document Released: 05/04/2005 Document Revised: 07/30/2013 Document Reviewed: 02/28/2008 [facility] Patient Information 2015 [place], [place]. This information is not intended to replace advice given to you by  your health care provider. Make sure you discuss any questions you have with your health care provider. ° °
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
## 2014-04-28 NOTE — ED Notes (Addendum)
Pt reports central chest tightness since last night. Seen at [facility] and sent here for further evaluation. Pt reports mild SOB with inspiration. Denies cough. Lungs clear bilaterally. Given breathing tx earlier today which helped resolve some tightness. Pt in NAD. Pt denies N/V, diaphoresis. Pt in NAD.

## 2014-04-29 ENCOUNTER — Encounter: Payer: Self-pay | Admitting: Family Medicine

## 2014-04-30 ENCOUNTER — Encounter: Payer: Self-pay | Admitting: Family Medicine

## 2014-04-30 NOTE — ED Provider Notes (Signed)
Medical screening examination/treatment/procedure(s) were performed by non-physician practitioner and as supervising physician I was immediately available for consultation/collaboration.   EKG Interpretation   Date/Time:  Monday April 28 2014 17:01:48 EDT Ventricular Rate:  72 PR Interval:  168 QRS Duration: 80 QT Interval:  372 QTC Calculation: 407 R Axis:   -12 Text Interpretation:  Normal sinus rhythm Low voltage QRS Cannot rule out  Anterior infarct , age undetermined Abnormal ECG No significant change  since last tracing Confirmed by Debby Freiberg [phone]) on 04/28/2014  11:08:39 PM        Debby Freiberg, MD 04/30/14 2008

## 2014-05-01 ENCOUNTER — Other Ambulatory Visit: Payer: Self-pay | Admitting: Obstetrics and Gynecology

## 2014-05-01 ENCOUNTER — Encounter: Payer: Self-pay | Admitting: Internal Medicine

## 2014-05-01 DIAGNOSIS — Z1231 Encounter for screening mammogram for malignant neoplasm of breast: Secondary | ICD-10-CM | POA: Diagnosis not present

## 2014-05-01 DIAGNOSIS — Z124 Encounter for screening for malignant neoplasm of cervix: Secondary | ICD-10-CM | POA: Diagnosis not present

## 2014-05-01 DIAGNOSIS — Z01419 Encounter for gynecological examination (general) (routine) without abnormal findings: Secondary | ICD-10-CM | POA: Diagnosis not present

## 2014-05-01 MED ORDER — LISINOPRIL 20 MG PO TABS: 20.0000 mg | ORAL_TABLET | Freq: Every day | ORAL | Status: DC

## 2014-05-02 LAB — CYTOLOGY - PAP

## 2014-05-04 ENCOUNTER — Other Ambulatory Visit: Payer: Self-pay | Admitting: Internal Medicine

## 2014-05-05 ENCOUNTER — Encounter: Payer: Self-pay | Admitting: *Deleted

## 2014-05-05 ENCOUNTER — Encounter: Payer: Self-pay | Admitting: Physician Assistant

## 2014-05-05 ENCOUNTER — Ambulatory Visit (INDEPENDENT_AMBULATORY_CARE_PROVIDER_SITE_OTHER): Payer: Medicare Other | Admitting: Physician Assistant

## 2014-05-05 ENCOUNTER — Encounter: Payer: Medicare Other | Attending: Internal Medicine | Admitting: Dietician

## 2014-05-05 VITALS — Wt 264.7 lb

## 2014-05-05 VITALS — BP 150/84 | HR 83 | Ht 64.0 in | Wt 257.0 lb

## 2014-05-05 DIAGNOSIS — I10 Essential (primary) hypertension: Secondary | ICD-10-CM

## 2014-05-05 DIAGNOSIS — Z713 Dietary counseling and surveillance: Secondary | ICD-10-CM | POA: Insufficient documentation

## 2014-05-05 DIAGNOSIS — E119 Type 2 diabetes mellitus without complications: Secondary | ICD-10-CM | POA: Insufficient documentation

## 2014-05-05 DIAGNOSIS — R079 Chest pain, unspecified: Secondary | ICD-10-CM

## 2014-05-05 DIAGNOSIS — E669 Obesity, unspecified: Secondary | ICD-10-CM

## 2014-05-05 HISTORY — DX: Chest pain, unspecified: R07.9
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
## 2014-05-05 NOTE — Patient Instructions (Addendum)
Your physician recommends that you continue on your current medications as directed. Please refer to the Current Medication list given to you today.    Your physician has requested that you have en exercise stress myoview. For further information please visit [email or web address]. Please follow instruction sheet, as given.    Your physician recommends that you schedule a follow-up appointment in:  WITH DR ROSS IN A MONTH    Low-Sodium Eating Plan Sodium raises blood pressure and causes water to be held in the body. Getting less sodium from food will help lower your blood pressure, reduce any swelling, and protect your heart, liver, and kidneys. We get sodium by adding salt (sodium chloride) to food. Most of our sodium comes from canned, boxed, and frozen foods. Restaurant foods, fast foods, and pizza are also very high in sodium. Even if you take medicine to lower your blood pressure or to reduce fluid in your body, getting less sodium from your food is important.   WHAT IS MY PLAN? Most people should limit their sodium intake to 2000 mg a day.  WHAT DO I NEED TO KNOW ABOUT THIS EATING PLAN? For the low-sodium eating plan, you will follow these general guidelines:  Choose foods with a % Daily Value for sodium of less than 5% (as listed on the food label).   Use salt-free seasonings or herbs instead of table salt or sea salt.   Check with your health care provider or pharmacist before using salt substitutes.   Eat fresh foods.  Eat more vegetables and fruits.  Limit canned vegetables. If you do use them, rinse them well to decrease the sodium.   Limit cheese to 1 oz (28 g) per day.   Eat lower-sodium products, often labeled as "lower sodium" or "no salt added."  Avoid foods that contain monosodium glutamate (MSG). MSG is sometimes added to [place] food and some canned foods.  Check food labels (Nutrition Facts labels) on foods to learn how much sodium is in one  serving.  Eat more home-cooked food and less restaurant, buffet, and fast food.  When eating at a restaurant, ask that your food be prepared with less salt or none, if possible.  HOW DO I READ FOOD LABELS FOR SODIUM INFORMATION? The Nutrition Facts label lists the amount of sodium in one serving of the food. If you eat more than one serving, you must multiply the listed amount of sodium by the number of servings. Food labels may also identify foods as:  Sodium free--Less than 5 mg in a serving.  Very low sodium--35 mg or less in a serving.  Low sodium--140 mg or less in a serving.  Light in sodium--50% less sodium in a serving. For example, if a food that usually has 300 mg of sodium is changed to become light in sodium, it will have 150 mg of sodium.  Reduced sodium--25% less sodium in a serving. For example, if a food that usually has 400 mg of sodium is changed to reduced sodium, it will have 300 mg of sodium. WHAT FOODS CAN I EAT? Grains Low-sodium cereals, including oats, puffed wheat and rice, and shredded wheat cereals. Low-sodium crackers. Unsalted rice and pasta. Lower-sodium bread.  Vegetables Frozen or fresh vegetables. Low-sodium or reduced-sodium canned vegetables. Low-sodium or reduced-sodium tomato sauce and paste. Low-sodium or reduced-sodium tomato and vegetable juices.  Fruits Fresh, frozen, and canned fruit. Fruit juice.  Meat and Other Protein Products Low-sodium canned tuna and salmon. Fresh or frozen meat, poultry, seafood,  and fish. Lamb. Unsalted nuts. Dried beans, peas, and lentils without added salt. Unsalted canned beans. Homemade soups without salt. Eggs.  Dairy Milk. Soy milk. Ricotta cheese. Low-sodium or reduced-sodium cheeses. Yogurt.  Condiments Fresh and dried herbs and spices. Salt-free seasonings. Onion and garlic powders. Low-sodium varieties of mustard and ketchup. Lemon juice.  Fats and Oils Reduced-sodium salad dressings. Unsalted  butter.  Other Unsalted popcorn and pretzels.  The items listed above may not be a complete list of recommended foods or beverages. Contact your dietitian for more options. WHAT FOODS ARE NOT RECOMMENDED? Grains Instant hot cereals. Bread stuffing, pancake, and biscuit mixes. Croutons. Seasoned rice or pasta mixes. Noodle soup cups. Boxed or frozen macaroni and cheese. Self-rising flour. Regular salted crackers. Vegetables Regular canned vegetables. Regular canned tomato sauce and paste. Regular tomato and vegetable juices. Frozen vegetables in sauces. Salted french fries. Olives. Angie Fava. Relishes. Sauerkraut. Salsa. Meat and Other Protein Products Salted, canned, smoked, spiced, or pickled meats, seafood, or fish. Bacon, ham, sausage, hot dogs, corned beef, chipped beef, and packaged luncheon meats. Salt pork. Jerky. Pickled herring. Anchovies, regular canned tuna, and sardines. Salted nuts. Dairy Processed cheese and cheese spreads. Cheese curds. Blue cheese and cottage cheese. Buttermilk.  Condiments Onion and garlic salt, seasoned salt, table salt, and sea salt. Canned and packaged gravies. Worcestershire sauce. Tartar sauce. Barbecue sauce. Teriyaki sauce. Soy sauce, including reduced sodium. Steak sauce. Fish sauce. Oyster sauce. Cocktail sauce. Horseradish. Regular ketchup and mustard. Meat flavorings and tenderizers. Bouillon cubes. Hot sauce. Tabasco sauce. Marinades. Taco seasonings. Relishes. Fats and Oils Regular salad dressings. Salted butter. Margarine. Ghee. Bacon fat.  Other Potato and tortilla chips. Corn chips and puffs. Salted popcorn and pretzels. Canned or dried soups. Pizza. Frozen entrees and pot pies.  The items listed above may not be a complete list of foods and beverages to avoid. Contact your dietitian for more information. Document Released: 01/14/2002 Document Revised: 07/30/2013 Document Reviewed: 05/29/2013 [facility] Patient Information 2015 [place],  [place]. This information is not intended to replace advice given to you by your health care provider. Make sure you discuss any questions you have with your health care provider.

## 2014-05-05 NOTE — Assessment & Plan Note (Signed)
Patient had an episode of chest pain associated with wheezing treated with nebulizers and steroids. She was admitted to the hospital were troponins were negative and EKG unchanged. Patient has multiple cardiac risk factors for CAD including hypertension, diabetes mellitus, and hyperlipidemia. We'll proceed with stress Myoview next week. Patient has no wheezing on exam today.
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
## 2014-05-05 NOTE — Assessment & Plan Note (Addendum)
Patient's blood pressure is elevated since she's on steroids. I've asked her to keep track of her blood pressure once her steroids are finished. She may need another agent possibly resuming HCTZ. Dr. Cathlean Cower just increased her lisinopril. 2 g sodium diet.

## 2014-05-05 NOTE — Patient Instructions (Addendum)
-  Practice pre op goals  [email or web address] (fill out registration page)

## 2014-05-06 ENCOUNTER — Other Ambulatory Visit: Payer: Self-pay

## 2014-05-06 ENCOUNTER — Encounter: Payer: Self-pay | Admitting: *Deleted

## 2014-05-06 DIAGNOSIS — Z0279 Encounter for issue of other medical certificate: Secondary | ICD-10-CM

## 2014-05-06 MED ORDER — METFORMIN HCL ER 500 MG PO TB24: 500.0000 mg | ORAL_TABLET | Freq: Every day | ORAL | Status: DC

## 2014-05-07 DIAGNOSIS — H04129 Dry eye syndrome of unspecified lacrimal gland: Secondary | ICD-10-CM | POA: Diagnosis not present

## 2014-05-07 DIAGNOSIS — E119 Type 2 diabetes mellitus without complications: Secondary | ICD-10-CM | POA: Diagnosis not present

## 2014-05-13 ENCOUNTER — Ambulatory Visit (HOSPITAL_COMMUNITY): Payer: Medicare Other | Attending: Cardiology | Admitting: Radiology

## 2014-05-13 VITALS — BP 143/82 | HR 71 | Ht 64.0 in | Wt 264.0 lb

## 2014-05-13 DIAGNOSIS — R0600 Dyspnea, unspecified: Secondary | ICD-10-CM | POA: Diagnosis not present

## 2014-05-13 DIAGNOSIS — R002 Palpitations: Secondary | ICD-10-CM | POA: Diagnosis not present

## 2014-05-13 DIAGNOSIS — R0789 Other chest pain: Secondary | ICD-10-CM | POA: Diagnosis not present

## 2014-05-13 DIAGNOSIS — E119 Type 2 diabetes mellitus without complications: Secondary | ICD-10-CM | POA: Insufficient documentation

## 2014-05-13 DIAGNOSIS — R079 Chest pain, unspecified: Secondary | ICD-10-CM | POA: Diagnosis not present

## 2014-05-13 DIAGNOSIS — I10 Essential (primary) hypertension: Secondary | ICD-10-CM | POA: Diagnosis not present

## 2014-05-13 DIAGNOSIS — R0602 Shortness of breath: Secondary | ICD-10-CM | POA: Insufficient documentation

## 2014-05-13 DIAGNOSIS — R9431 Abnormal electrocardiogram [ECG] [EKG]: Secondary | ICD-10-CM

## 2014-05-13 MED ORDER — TECHNETIUM TC 99M SESTAMIBI GENERIC - CARDIOLITE
33.0000 | Freq: Once | INTRAVENOUS | Status: AC | PRN
  Administered 2014-05-13: 33 via INTRAVENOUS

## 2014-05-13 NOTE — Progress Notes (Signed)
[facility] [address] [place], [place] [address] [phone]    Cardiology Nuclear Med Study  Elizabeth Hawkins is a 58 y.o. female     MRN : [id number]     DOB: [birth year]  Procedure Date: 05/13/2014  Nuclear Med Background Indication for Stress Test:  Evaluation for Ischemia, Abnormal EKG, and Patient seen in hospital on 04-28-2014 for Chest Pain, DOE; Enzymes were negative History:  Asthma, No prior known history of CAD Cardiac Risk Factors: Hypertension and NIDDM  Symptoms: Chest Tightness with/without exertion (last occurrence prior to test 3/10 relieved after used Albuterol Inhaler), DOE, Palpitations and SOB   Nuclear Pre-Procedure Caffeine/Decaff Intake:  None> 12 hrs NPO After: 10:00pm   Lungs:  clear O2 Sat: 94-98% after deep breath on room air. Albuterol Inhaler 2 sprays prior to test IV 0.9% NS with Angio Cath:  22g  IV Site: R Antecubital x 1, tolerated well IV Started by:  Irven Baltimore, RN  Chest Size (in):  42 Cup Size: H  Height: 5\' 4"  (1.626 m)  Weight:  264 lb (119.75 kg)  BMI:  Body mass index is 45.29 kg/(m^2). Tech Comments:  No medications (metformin) this am. Irven Baltimore, RN.    Nuclear Med Study 1 or 2 day study: 2 day  Stress Test Type:  Stress  Reading MD: N/A  Order Authorizing Provider:  Dorris Carnes, MD, and Ermalinda Barrios, [facility]  Resting Radionuclide: Technetium 69m Sestamibi  Resting Radionuclide Dose: 33.0 on 05/16/2014 mCi   Stress Radionuclide:  Technetium 24m Sestamibi  Stress Radionuclide Dose: 33.0 on 05/13/2014 mCi           Stress Protocol Rest HR: 71 Stress HR: 144  Rest BP: 143/82 Stress BP: 231/63  Exercise Time (min): 6:00 METS: 7.0   Predicted Max HR: 163 bpm % Max HR: 88.34 bpm Rate Pressure Product: 33264   Dose of Adenosine (mg):  n/a Dose of Lexiscan: n/a mg  Dose of Atropine (mg): n/a Dose of Dobutamine: n/a mcg/kg/min (at max HR)  Stress Test Technologist: Irven Baltimore, RN  Nuclear  Technologist:  Annye Rusk, CNMT     Rest Procedure:  Myocardial perfusion imaging was performed at rest 45 minutes following the intravenous administration of Technetium 43m Sestamibi. Rest ECG: NSR anterolateral T wave inversions  Stress Procedure:  The patient exercised on the treadmill utilizing the Bruce Protocol for 6:00 minutes, RPE 17. The patient stopped due to DOE and denied any chest pain during test. Patient had chest tightness prior to test that resolved after using her Albuterol Inhaler.  Technetium 25m Sestamibi was injected at peak exercise and myocardial perfusion imaging was performed after a brief delay. Stress ECG: PVC;s and normalization of anterolateral T wave changes with stress  QPS Raw Data Images:  Patient motion noted. Stress Images:  Normal homogeneous uptake in all areas of the myocardium. Rest Images:  Normal homogeneous uptake in all areas of the myocardium. Subtraction (SDS):  No evidence of ischemia. Transient Ischemic Dilatation (Normal <1.22):  0.88 Lung/Heart Ratio (Normal <0.45):  0.28  Quantitative Gated Spect Images QGS EDV:  90 ml QGS ESV:  34 ml  Impression Exercise Capacity:  Fair exercise capacity. BP Response:  Hypertensive blood pressure response. Clinical Symptoms:  There is dyspnea. ECG Impression:  No significant ST segment change suggestive of ischemia. Comparison with Prior Nuclear Study: No previous nuclear study performed  Overall Impression:  Normal stress nuclear study.  LV Ejection Fraction: 62%.  LV Wall Motion:  NL LV Function; NL Wall Motion    [name]

## 2014-05-16 ENCOUNTER — Ambulatory Visit (HOSPITAL_COMMUNITY): Payer: 59

## 2014-05-16 MED ORDER — TECHNETIUM TC 99M SESTAMIBI GENERIC - CARDIOLITE
33.0000 | Freq: Once | INTRAVENOUS | Status: AC | PRN
  Administered 2014-05-16: 33 via INTRAVENOUS

## 2014-06-03 DIAGNOSIS — Z0271 Encounter for disability determination: Secondary | ICD-10-CM

## 2014-06-10 ENCOUNTER — Encounter: Payer: Self-pay | Admitting: Internal Medicine

## 2014-06-10 NOTE — Telephone Encounter (Signed)
Elizabeth Hawkins - to offer pt appt next avialable for her left foot numbness

## 2014-06-12 ENCOUNTER — Encounter: Payer: Self-pay | Admitting: Internal Medicine

## 2014-06-12 ENCOUNTER — Ambulatory Visit (INDEPENDENT_AMBULATORY_CARE_PROVIDER_SITE_OTHER): Payer: Medicare Other | Admitting: Internal Medicine

## 2014-06-12 VITALS — BP 160/90 | HR 78 | Temp 98.6°F | Ht 64.0 in | Wt 268.0 lb

## 2014-06-12 DIAGNOSIS — L600 Ingrowing nail: Secondary | ICD-10-CM

## 2014-06-12 DIAGNOSIS — I10 Essential (primary) hypertension: Secondary | ICD-10-CM | POA: Diagnosis not present

## 2014-06-12 DIAGNOSIS — R208 Other disturbances of skin sensation: Secondary | ICD-10-CM

## 2014-06-12 DIAGNOSIS — E119 Type 2 diabetes mellitus without complications: Secondary | ICD-10-CM

## 2014-06-12 DIAGNOSIS — Z23 Encounter for immunization: Secondary | ICD-10-CM

## 2014-06-12 DIAGNOSIS — R200 Anesthesia of skin: Secondary | ICD-10-CM

## 2014-06-12 MED ORDER — LISINOPRIL 40 MG PO TABS: 40.0000 mg | ORAL_TABLET | Freq: Every day | ORAL | Status: DC

## 2014-06-12 MED ORDER — PREDNISONE 10 MG PO TABS: ORAL_TABLET | ORAL | Status: DC

## 2014-06-12 NOTE — Patient Instructions (Signed)
You had the flu shot today  Ok to increase the lisinopril to 40 mg per day  Please take all new medication as prescribed - the prednsione  You will be contacted regarding the referral for: podiatry  Please continue all other medications as before,   Please keep your appointments with your specialists as you may have planned

## 2014-06-12 NOTE — Progress Notes (Signed)
Pre visit review using our clinic review tool, if applicable. No additional management support is needed unless otherwise documented below in the visit note. 
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
## 2014-06-14 DIAGNOSIS — R200 Anesthesia of skin: Secondary | ICD-10-CM | POA: Insufficient documentation

## 2014-06-14 DIAGNOSIS — L600 Ingrowing nail: Secondary | ICD-10-CM | POA: Insufficient documentation

## 2014-06-14 NOTE — Assessment & Plan Note (Signed)
stable overall by history and exam, recent data reviewed with pt, and pt to continue medical treatment as before,  to f/u any worsening symptoms or concerns Lab Results  Component Value Date   HGBA1C 6.0 03/21/2014

## 2014-06-14 NOTE — Assessment & Plan Note (Signed)
Also for podiatry referral, also for DM check

## 2014-06-14 NOTE — Assessment & Plan Note (Signed)
Left great toe only, exam o/w benign, etiology unclear, most c/w local neuritic such as peroneal , but also cant r/o lower back neuritis; doubt CVA, for predpack asd,  to f/u any worsening symptoms or concerns, declines NCS/EMG for now or other imaging

## 2014-06-14 NOTE — Assessment & Plan Note (Signed)
Mild uncontrolled, for increased lisinopril 40 qd,  to f/u any worsening symptoms or concerns, f/u BP at home and next visit
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
## 2014-06-16 ENCOUNTER — Encounter: Payer: Self-pay | Admitting: Internal Medicine

## 2014-06-16 ENCOUNTER — Ambulatory Visit (INDEPENDENT_AMBULATORY_CARE_PROVIDER_SITE_OTHER): Payer: Medicare Other | Admitting: Internal Medicine

## 2014-06-16 VITALS — BP 170/92 | HR 74 | Ht 64.0 in | Wt 264.8 lb

## 2014-06-16 DIAGNOSIS — I10 Essential (primary) hypertension: Secondary | ICD-10-CM | POA: Diagnosis not present

## 2014-06-16 LAB — BASIC METABOLIC PANEL
BUN: 19 mg/dL (ref 6–23)
CO2: 26 mEq/L (ref 19–32)
CREATININE: 0.9 mg/dL (ref 0.4–1.2)
Calcium: 9.2 mg/dL (ref 8.4–10.5)
Chloride: 104 mEq/L (ref 96–112)
GFR: 86.21 mL/min (ref >60.00)
GLUCOSE: 137 mg/dL — AB (ref 70–99)
POTASSIUM: 4 meq/L (ref 3.5–5.1)
Sodium: 138 mEq/L (ref 135–145)

## 2014-06-16 MED ORDER — TRIAMTERENE-HCTZ 37.5-25 MG PO TABS: 0.5000 | ORAL_TABLET | Freq: Every day | ORAL | Status: DC

## 2014-06-16 NOTE — Patient Instructions (Signed)
Your physician has recommended you make the following change in your medication:  1.) START MAXZIDE 37.5 / 25   ----- TAKE 1/2 TABLET ONCE DAILY Your physician recommends that you return for lab work TODAY. (BMET) Your physician recommends that you schedule a follow-up appointment in: [place].

## 2014-06-17 ENCOUNTER — Encounter: Payer: Medicare Other | Attending: Internal Medicine | Admitting: Dietician

## 2014-06-17 VITALS — Ht 64.0 in | Wt 260.9 lb

## 2014-06-17 DIAGNOSIS — Z713 Dietary counseling and surveillance: Secondary | ICD-10-CM | POA: Insufficient documentation

## 2014-06-17 DIAGNOSIS — E669 Obesity, unspecified: Secondary | ICD-10-CM

## 2014-06-17 DIAGNOSIS — Z6841 Body Mass Index (BMI) 40.0 and over, adult: Secondary | ICD-10-CM | POA: Insufficient documentation

## 2014-06-17 NOTE — Progress Notes (Signed)
  Supervised weight loss:  Appt start time: 1140 end time:  1155.  SWL visit 3:  Primary concerns today: Elizabeth Hawkins returns today for her 3rd SWL visit out of 6 in preparation for gastric sleeve surgery. Weight is fluctuating though is down 4 lbs since her last appointment. Eats slowly and chews food well. Drinking 5 calorie cranberry juice and water.   Lives by herself and is baking a lot of foods. Has cut back a lot on potato chips. Trying to keep sodium really low.    MEDICATIONS: see list  Estimated energy needs: 1400-1600 calories 158-180 g carbohydrates 105-120 g protein 39-44 g fat  Progress Towards Goal(s):  In progress.   Nutritional Diagnosis:  [place]-3.3 Overweight/obesity related to past poor dietary habits and physical inactivity as evidenced by patient in SWL for pending bariatric surgery following dietary guidelines for continued weight loss.     Intervention:  Nutrition counseling provided.  Handouts given out during appointment:   Low Sodium Flavoring Tips   Monitoring/Evaluation:  Dietary intake, exercise, pre op goals, and body weight in 4 week(s).

## 2014-06-17 NOTE — Patient Instructions (Addendum)
Aim to keep sodium around 400-500 mg per meal (or 1500 mg total per day). Work on not drinking 15 minutes before, during, and up through 30 minutes after a meal.  Vitamins/Mineral to take after surgery: 2 x adult dose of multivitamin, 1500 mg of calcium citrate, 300-500 mcg of Vitamin B12 (sublingually).  Try Community education officer. [email or web address] (fill out registration page)

## 2014-06-21 ENCOUNTER — Encounter: Payer: Self-pay | Admitting: Podiatry

## 2014-06-21 ENCOUNTER — Ambulatory Visit (INDEPENDENT_AMBULATORY_CARE_PROVIDER_SITE_OTHER): Payer: Medicare Other | Admitting: Podiatry

## 2014-06-21 VITALS — BP 115/70 | HR 80 | Resp 12

## 2014-06-21 DIAGNOSIS — B351 Tinea unguium: Secondary | ICD-10-CM | POA: Diagnosis not present

## 2014-06-21 DIAGNOSIS — R208 Other disturbances of skin sensation: Secondary | ICD-10-CM

## 2014-06-21 DIAGNOSIS — L600 Ingrowing nail: Secondary | ICD-10-CM | POA: Diagnosis not present

## 2014-06-21 DIAGNOSIS — R200 Anesthesia of skin: Secondary | ICD-10-CM

## 2014-06-21 MED ORDER — CEPHALEXIN 500 MG PO CAPS
500.0000 mg | ORAL_CAPSULE | Freq: Three times a day (TID) | ORAL | Status: DC
Start: 2014-06-21 — End: 2014-09-10

## 2014-06-21 NOTE — Progress Notes (Signed)
Subjective:    Patient ID: Elizabeth Hawkins, female    DOB: 1956-06-01, 58 y.o.   MRN: [id number]  HPI 58 year old female presents the office today for diabetic risk assessment and for painful ingrown toenails to both big toes. She states that she has had ingrowing toenails for some time but she has noticed increased pain more recently. She denies any recent drainage from around the area. She states that all her nails are painful, particularly with shoegear as they are elongated and thick. She also states that she has numbness to her left big toe. She was previously seen by her primary care physician for this. She was having some back pain for which she was started on prednisone 5 day taper course which she completed in she states that helped some. She continues have mild numbness her left hallux. She denies any recent injury or trauma to the area.  Patient is diabetic and she states her blood sugar typically run between 97-110. She denies any history of ulceration or any intermittent claudication symptoms.   Review of Systems  Skin: Positive for color change.  All other systems reviewed and are negative.      Objective:   Physical Exam Objective: AAO x3, NAD DP/PT pulses palpable bilaterally, CRT less than 3 seconds Protective sensation intact with Simms Weinstein monofilament, vibratory sensation intact, Achilles tendon reflex intact Significant ingrowing noted in both the medial and lateral nail borders of bilateral hallux. There is tenderness palpation overlying these areas. There is mild edema over the nail borders. There is no drainage identified and no cellulitis or erythema. All nails are hypertrophic, dystrophic, elongated, brittle No open lesions No calf pain with compression/swelling/warmth/erythema.        Assessment & Plan:  58 year old female with significant ingrowing to bilateral hallux nail borders as well as symptomatic onychomycosis; left hallux  numbness. -Conservative versus surgical treatment were discussed including alternatives, risks, complications. -Nail sharply debrided 10 without complications. -for the left hallux numbness the etiology of this is unclear. Patient previously suffered conduction testing for which she declined. We'll continue to monitor this area. I discussed with her that if the symptoms worsen to call the office. Follow-up with primary care physician. -For bilateral hallux ingrown toenails, I debrided the areas as much as possible however she continued to have symptoms. At that time the patient not to proceed with partial nail avulsions. Risks and complications of the procedure were discussed the patient for which she understood and verbally consented. Under sterile conditions a total of 2.5 mL of a one-to-one mixture of 2% lidocaine plain and 0.5% Marcaine plain was infiltrated a hallux block fashion bilaterally. Once anesthetized the skin was then prepped in sterile fashion. Next both the medial and lateral nail borders were removed bilateral hallux. There was noted to be very significant amount ingrowing noted to both nail borders. There is no purulence identified or other clinical signs of infection. There is also to be a large amount of debris within the nail borders. Once the area was debrided and ensured all nail borders were removed the area was then irrigated. Silvadene was applied followed by a dry sterile dressing. At the conclusion of the procedure there is noted to be an immediate capillary refill time noted the digit. No tourniquet was used for the procedure. Patient tolerated the procedure well without any complications. -Post procedure instructions were discussed with the patient for which she verbally understood. -Prescribed Keflex. -Monitor for any clinical signs or symptoms of infection  and directed to call the office immediately if any are to occur or go to the emergency room. -Follow-up in 1 week or  sooner if any palms are to arise. In the meantime, call the office any questions, concerns.

## 2014-06-21 NOTE — Patient Instructions (Addendum)
Betadine Soak Instructions  Purchase an 8 oz. bottle of BETADINE solution (Povidone)  THE DAY AFTER THE PROCEDURE  Place 1 tablespoon of betadine solution in a quart of warm tap water.  Submerge your foot or feet with outer bandage intact for the initial soak; this will allow the bandage to become moist and wet for easy lift off.  Once you remove your bandage, continue to soak in the solution for 20 minutes.  This soak should be done twice a day.  Next, remove your foot or feet from solution, blot dry the affected area and cover.  You may use a band aid large enough to cover the area or use gauze and tape.  Apply other medications to the area as directed by the doctor such as cortisporin otic solution (ear drops) or neosporin.  IF YOUR SKIN BECOMES IRRITATED WHILE USING THESE INSTRUCTIONS, IT IS OKAY TO SWITCH TO EPSOM SALTS AND WATER OR [place] VINEGAR AND WATER.  Monitor for any signs/symptoms of infection. Call the office immediately if any occur or go directly to the emergency room. Call with any questions/concerns.

## 2014-06-26 ENCOUNTER — Ambulatory Visit: Payer: Medicare Other | Admitting: Internal Medicine

## 2014-06-27 ENCOUNTER — Ambulatory Visit (INDEPENDENT_AMBULATORY_CARE_PROVIDER_SITE_OTHER): Payer: Medicare Other | Admitting: Podiatry

## 2014-06-27 ENCOUNTER — Encounter: Payer: Self-pay | Admitting: Podiatry

## 2014-06-27 VITALS — BP 161/97 | HR 87 | Resp 14

## 2014-06-27 DIAGNOSIS — L600 Ingrowing nail: Secondary | ICD-10-CM

## 2014-06-27 NOTE — Patient Instructions (Signed)
Continue epsom salt soaks twice a day followed by antibiotic ointment and a band-aid. Can leave uncovered at night. Monitor for any signs/symptoms of infection. Call the office immediately if any occur or go directly to the emergency room. Call with any questions/concerns.

## 2014-06-30 ENCOUNTER — Telehealth: Payer: Self-pay | Admitting: Internal Medicine

## 2014-06-30 NOTE — Telephone Encounter (Signed)
New Msg   Patient states she was told to call if BP was high.Patient states BP is still elevated. Patient has the following readings.... Today at 4pm 142/113, this morning 140/95. Yesterday evening it was 134/90 and yesterday morning it was 159/112. Please contact patient at [phone]

## 2014-06-30 NOTE — Progress Notes (Signed)
Patient ID: Elizabeth Hawkins, female   DOB: 20-May-1956, 58 y.o.   MRN: [id number]  Subjective: 58 year old female returns the office today one-week status post bilateral hallux medial and lateral partial nail avulsion secondary to ingrown toenails. She states that she discontinue soaking them approximately 3 days after the procedure. She is been applying antibiotic ointment and a Band-Aid to the area. She denies any significant pain to the area. No drainage or increase in warmth. No streaking. Denies any systemic complaints as fevers, chills, nausea, vomiting.  Objective: AAO x3, NAD DP/PT pulses palpable bilaterally, CRT less than 3 seconds Protective sensation intact with Simms Weinstein monofilament, vibratory sensation intact, Achilles tendon reflex intact Status post bilateral medial and lateral partial nail avulsions. There is small amount of fibrotic tissue within the nail borders. There is no purulence or drainage identified. No surrounding erythema or ascending cellulitis. No areas of fluctuance or crepitus. Mild tenderness over the procedure site although decreased compared to before the procedure. No pain with calf compression, swelling, warmth, erythema. MMT 5/5, ROM WNL  Assessment: 58 year old female 1 week status post bilateral partial nail avulsions medial lateral nail border  Plan: -Treatment options discussed including alternatives, risks, complications. -Recommend to continue soaking Epson salt soaks twice a day followed by antibiotic ointment and a Band-Aid. Can leave uncovered at night. Continue this until completely healed. Monitor for any clinical signs or symptoms of infection and directed to call the office immediately if any are to occur or go to the emergency room. -Follow-up in 2 weeks. In the meantime, call the office with any questions, concerns, change in symptoms.

## 2014-07-07 ENCOUNTER — Other Ambulatory Visit: Payer: Self-pay | Admitting: Internal Medicine

## 2014-07-07 NOTE — Telephone Encounter (Signed)
Advised patient unsure why she didn't get return call/answer last week. Informed her that Dr. Harrington Challenger is not in the office but we would send information to her for review. Patient verbalized understanding and agreeable to plan.

## 2014-07-07 NOTE — Telephone Encounter (Signed)
Pt called on 06-30-14 and has not had any repsonse, needs to know what to do, take med differently, make earlier appt, appt 12-7 at 945am,  141/87 just now 1220pm, 119/84 early this am, 136/84 yesterday, 127/88 yesterday am, pt having headaches and neck pain x 1 week,  Did not take BP this am,  pls adivse [phone]

## 2014-07-07 NOTE — Telephone Encounter (Signed)
BP is better on more recent check I would not make any changes p

## 2014-07-08 NOTE — Telephone Encounter (Signed)
Patient informed of Dr. Alan Ripper recommendations. She has an appointment with her on 07/14/2014.

## 2014-07-11 ENCOUNTER — Other Ambulatory Visit (INDEPENDENT_AMBULATORY_CARE_PROVIDER_SITE_OTHER): Payer: Self-pay

## 2014-07-11 DIAGNOSIS — E785 Hyperlipidemia, unspecified: Secondary | ICD-10-CM

## 2014-07-11 DIAGNOSIS — Z6841 Body Mass Index (BMI) 40.0 and over, adult: Secondary | ICD-10-CM | POA: Diagnosis not present

## 2014-07-11 DIAGNOSIS — K219 Gastro-esophageal reflux disease without esophagitis: Secondary | ICD-10-CM

## 2014-07-11 DIAGNOSIS — D509 Iron deficiency anemia, unspecified: Secondary | ICD-10-CM

## 2014-07-11 DIAGNOSIS — G4733 Obstructive sleep apnea (adult) (pediatric): Secondary | ICD-10-CM

## 2014-07-11 DIAGNOSIS — E0821 Diabetes mellitus due to underlying condition with diabetic nephropathy: Secondary | ICD-10-CM

## 2014-07-11 DIAGNOSIS — R5382 Chronic fatigue, unspecified: Secondary | ICD-10-CM
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
## 2014-07-14 ENCOUNTER — Ambulatory Visit (INDEPENDENT_AMBULATORY_CARE_PROVIDER_SITE_OTHER): Payer: Medicare Other | Admitting: Internal Medicine

## 2014-07-14 ENCOUNTER — Encounter: Payer: Self-pay | Admitting: Internal Medicine

## 2014-07-14 VITALS — BP 115/78 | HR 65 | Ht 64.0 in | Wt 262.8 lb

## 2014-07-14 DIAGNOSIS — I10 Essential (primary) hypertension: Secondary | ICD-10-CM | POA: Diagnosis not present

## 2014-07-14 LAB — BASIC METABOLIC PANEL
BUN: 28 mg/dL — ABNORMAL HIGH (ref 6–23)
CHLORIDE: 103 meq/L (ref 96–112)
CO2: 27 meq/L (ref 19–32)
Calcium: 9.4 mg/dL (ref 8.4–10.5)
Creatinine, Ser: 1.3 mg/dL — ABNORMAL HIGH (ref 0.4–1.2)
GFR: 55.2 mL/min — ABNORMAL LOW (ref >60.00)
Glucose, Bld: 96 mg/dL (ref 70–99)
Potassium: 4.5 mEq/L (ref 3.5–5.1)
SODIUM: 137 meq/L (ref 135–145)

## 2014-07-14 NOTE — Patient Instructions (Signed)
Your physician recommends that you continue on your current medications as directed. Please refer to the Current Medication list given to you today. Your physician recommends that you return for lab work today (BMET)  

## 2014-07-15 ENCOUNTER — Other Ambulatory Visit: Payer: Self-pay | Admitting: *Deleted

## 2014-07-15 DIAGNOSIS — I10 Essential (primary) hypertension: Secondary | ICD-10-CM

## 2014-07-17 ENCOUNTER — Encounter: Payer: Medicare Other | Attending: Internal Medicine | Admitting: Dietician

## 2014-07-17 VITALS — Ht 64.0 in | Wt 260.0 lb

## 2014-07-17 DIAGNOSIS — G4730 Sleep apnea, unspecified: Secondary | ICD-10-CM | POA: Diagnosis not present

## 2014-07-17 DIAGNOSIS — Z6841 Body Mass Index (BMI) 40.0 and over, adult: Secondary | ICD-10-CM | POA: Diagnosis not present

## 2014-07-17 DIAGNOSIS — K219 Gastro-esophageal reflux disease without esophagitis: Secondary | ICD-10-CM | POA: Diagnosis not present

## 2014-07-17 DIAGNOSIS — J45909 Unspecified asthma, uncomplicated: Secondary | ICD-10-CM | POA: Diagnosis not present

## 2014-07-17 DIAGNOSIS — Z713 Dietary counseling and surveillance: Secondary | ICD-10-CM | POA: Diagnosis not present

## 2014-07-17 DIAGNOSIS — E119 Type 2 diabetes mellitus without complications: Secondary | ICD-10-CM | POA: Diagnosis not present

## 2014-07-17 DIAGNOSIS — I10 Essential (primary) hypertension: Secondary | ICD-10-CM | POA: Diagnosis not present

## 2014-07-17 DIAGNOSIS — E669 Obesity, unspecified: Secondary | ICD-10-CM

## 2014-07-17 NOTE — Patient Instructions (Addendum)
Aim to keep sodium around 400-500 mg per meal (or 1500 mg total per day). Work on not drinking 15 minutes before, during, and up through 30 minutes after a meal.  Vitamins/Mineral to take after surgery: 2 x adult dose of multivitamin, 1500 mg of calcium citrate, 300-500 mcg of Vitamin B12 (sublingually).  Try Community education officer. [email or web address] (fill out registration page)  Support group: Thursday, December 17th at [facility]: [email or web address]

## 2014-07-17 NOTE — Progress Notes (Signed)
  Supervised weight loss:  Appt start time: 940 end time:  955.  SWL visit 4:  Primary concerns today: Elizabeth Hawkins returns today for her 4th SWL visit out of 6 in preparation for bariatric surgery. She has lost 1 pound since last visit. Met with Dr. Excell Seltzer and has decided on RYGB. [name] states she has been practicing pre op goals like working on not drinking while eating.   Wt Readings from Last 3 Encounters:  07/17/14 260 lb (117.935 kg)  07/14/14 262 lb 12.8 oz (119.205 kg)  06/17/14 260 lb 14.4 oz (118.343 kg)   Ht Readings from Last 3 Encounters:  07/17/14 _0  (1.626 m)  07/14/14 _1  (1.626 m)  06/17/14 _2  (1.626 m)   Body mass index is 44.61 kg/(m^2). _3 @ Normalized weight-for-age data available only for age 68 to 55 years. Normalized stature-for-age data available only for age 68 to 21 years.    MEDICATIONS: see list  Estimated energy needs: 1400-1600 calories 158-180 g carbohydrates 105-120 g protein 39-44 g fat  Progress Towards Goal(s):  In progress.   Nutritional Diagnosis:  [place]-3.3 Overweight/obesity related to past poor dietary habits and physical inactivity as evidenced by patient in SWL for pending bariatric surgery following dietary guidelines for continued weight loss.     Intervention:  Nutrition counseling provided.   Monitoring/Evaluation:  Dietary intake, exercise, pre op goals, and body weight in 4 week(s).

## 2014-07-21 ENCOUNTER — Encounter: Payer: Medicare Other | Admitting: Dietician

## 2014-07-21 ENCOUNTER — Other Ambulatory Visit (INDEPENDENT_AMBULATORY_CARE_PROVIDER_SITE_OTHER): Payer: Self-pay

## 2014-07-21 ENCOUNTER — Encounter: Payer: Self-pay | Admitting: Dietician

## 2014-07-21 VITALS — Ht 64.0 in | Wt 262.0 lb

## 2014-07-21 DIAGNOSIS — E119 Type 2 diabetes mellitus without complications: Secondary | ICD-10-CM | POA: Diagnosis not present

## 2014-07-21 DIAGNOSIS — E669 Obesity, unspecified: Secondary | ICD-10-CM

## 2014-07-21 DIAGNOSIS — I10 Essential (primary) hypertension: Secondary | ICD-10-CM | POA: Diagnosis not present

## 2014-07-21 DIAGNOSIS — Z6841 Body Mass Index (BMI) 40.0 and over, adult: Secondary | ICD-10-CM | POA: Diagnosis not present

## 2014-07-21 DIAGNOSIS — Z713 Dietary counseling and surveillance: Secondary | ICD-10-CM | POA: Diagnosis not present

## 2014-07-21 DIAGNOSIS — J45909 Unspecified asthma, uncomplicated: Secondary | ICD-10-CM | POA: Diagnosis not present

## 2014-07-21 NOTE — Progress Notes (Signed)
  Pre-Op Assessment Visit:  Pre-Operative RYGB Surgery  Medical Nutrition Therapy:  Appt start time: 0900   End time:  4098.  Patient was seen on 07/21/2014 for Pre-Operative Nutrition Assessment. Assessment and letter of approval faxed to [facility] Bariatric Surgery Program coordinator on 07/21/2014.   Preferred Learning Style:   No preference indicated   Learning Readiness:   Ready  Handouts given during visit include:  Pre-Op Goals Bariatric Surgery Protein Shakes   During the appointment today the following Pre-Op Goals were reviewed with the patient: Maintain or lose weight as instructed by your surgeon Make healthy food choices Begin to limit portion sizes Limited concentrated sugars and fried foods Keep fat/sugar in the single digits per serving on   food labels Practice CHEWING your food  (aim for 30 chews per bite or until applesauce consistency) Practice not drinking 15 minutes before, during, and 30 minutes after each meal/snack Avoid all carbonated beverages  Avoid/limit caffeinated beverages  Avoid all sugar-sweetened beverages Consume 3 meals per day; eat every 3-5 hours Make a list of non-food related activities Aim for 64-100 ounces of FLUID daily  Aim for at least 60-80 grams of PROTEIN daily Look for a liquid protein source that contain ?15 g protein and ?5 g carbohydrate  (ex: shakes, drinks, shots)  Patient-Centered Goals: -Decrease medications -Overall health -Taking care of herself  Demonstrated degree of understanding via:  Teach Back  Teaching Method Utilized:  Visual Auditory Hands on  Barriers to learning/adherence to lifestyle change: none  Patient to call the [facility] to enroll in Pre-Op and Post-Op Nutrition Education when surgery date is scheduled.

## 2014-07-21 NOTE — Patient Instructions (Signed)
Follow Pre-Op Goals Try Protein Shakes Call [facility] at [phone] when surgery is scheduled to enroll in Pre-Op Class  Things to remember:  Please always be honest with us. We want to support you!  If you have any questions or concerns in between appointments, please call or email Liz, Eugenio Dollins, or Laurie.  The diet after surgery will be high protein and low in carbohydrate.  Vitamins and calcium need to be taken for the rest of your life.  Feel free to include support people in any classes or appointments.  

## 2014-07-22 ENCOUNTER — Other Ambulatory Visit (INDEPENDENT_AMBULATORY_CARE_PROVIDER_SITE_OTHER): Payer: Medicare Other | Admitting: *Deleted

## 2014-07-22 DIAGNOSIS — I10 Essential (primary) hypertension: Secondary | ICD-10-CM | POA: Diagnosis not present

## 2014-07-22 LAB — BASIC METABOLIC PANEL
BUN: 18 mg/dL (ref 6–23)
CHLORIDE: 101 meq/L (ref 96–112)
CO2: 24 mEq/L (ref 19–32)
Calcium: 9 mg/dL (ref 8.4–10.5)
Creatinine, Ser: 1.1 mg/dL (ref 0.4–1.2)
GFR: 69.36 mL/min (ref >60.00)
GLUCOSE: 103 mg/dL — AB (ref 70–99)
POTASSIUM: 3.6 meq/L (ref 3.5–5.1)
SODIUM: 133 meq/L — AB (ref 135–145)

## 2014-08-04 ENCOUNTER — Other Ambulatory Visit (INDEPENDENT_AMBULATORY_CARE_PROVIDER_SITE_OTHER): Payer: Self-pay

## 2014-08-14 ENCOUNTER — Other Ambulatory Visit (INDEPENDENT_AMBULATORY_CARE_PROVIDER_SITE_OTHER): Payer: Self-pay | Admitting: General Surgery

## 2014-08-18 ENCOUNTER — Ambulatory Visit: Payer: Medicare Other | Admitting: Dietician

## 2014-08-18 ENCOUNTER — Ambulatory Visit (HOSPITAL_COMMUNITY): Admission: RE | Admit: 2014-08-18 | Payer: Medicare Other | Source: Ambulatory Visit | Admitting: General Surgery

## 2014-08-18 ENCOUNTER — Encounter (HOSPITAL_COMMUNITY): Admission: RE | Payer: Self-pay | Source: Ambulatory Visit

## 2014-08-18 SURGERY — BREATH TEST, FOR HELICOBACTER PYLORI

## 2014-08-20 ENCOUNTER — Encounter: Payer: Medicare Other | Attending: Internal Medicine | Admitting: Dietician

## 2014-08-20 ENCOUNTER — Other Ambulatory Visit: Payer: Self-pay | Admitting: *Deleted

## 2014-08-20 ENCOUNTER — Ambulatory Visit: Payer: Medicare Other | Admitting: Dietician

## 2014-08-20 VITALS — Ht 64.0 in | Wt 255.6 lb

## 2014-08-20 DIAGNOSIS — Z6841 Body Mass Index (BMI) 40.0 and over, adult: Secondary | ICD-10-CM | POA: Insufficient documentation

## 2014-08-20 DIAGNOSIS — E119 Type 2 diabetes mellitus without complications: Secondary | ICD-10-CM | POA: Diagnosis not present

## 2014-08-20 DIAGNOSIS — K219 Gastro-esophageal reflux disease without esophagitis: Secondary | ICD-10-CM | POA: Diagnosis not present

## 2014-08-20 DIAGNOSIS — Z713 Dietary counseling and surveillance: Secondary | ICD-10-CM | POA: Insufficient documentation

## 2014-08-20 DIAGNOSIS — E669 Obesity, unspecified: Secondary | ICD-10-CM

## 2014-08-20 DIAGNOSIS — G4730 Sleep apnea, unspecified: Secondary | ICD-10-CM | POA: Diagnosis not present

## 2014-08-20 DIAGNOSIS — I10 Essential (primary) hypertension: Secondary | ICD-10-CM | POA: Diagnosis not present

## 2014-08-20 DIAGNOSIS — Z79899 Other long term (current) drug therapy: Secondary | ICD-10-CM | POA: Diagnosis not present

## 2014-08-20 MED ORDER — LEVOTHYROXINE SODIUM 50 MCG PO TABS: 25.0000 ug | ORAL_TABLET | Freq: Every day | ORAL | Status: DC

## 2014-08-20 NOTE — Patient Instructions (Addendum)
Aim to keep sodium around 400-500 mg per meal (or 1500 mg total per day). Work on not drinking 15 minutes before, during, and up through 30 minutes after a meal.  Vitamins/Mineral to take after surgery: 2 x adult dose of multivitamin, 1500 mg of calcium citrate, 300-500 mcg of Vitamin B12 (sublingually).  Try Community education officer. Look into doing some indoor exercise.   Sherion Godwin: [email or web address] or [phone]

## 2014-08-20 NOTE — Progress Notes (Signed)
  Supervised weight loss:  Appt start time: 745 end time:  800.  SWL visit 5:  Primary concerns today: Elizabeth Hawkins returns today for her 5th SWL visit out of 6 in preparation for bariatric surgery. She has lost 7 pound since last visit. Elizabeth Hawkins states she has been practicing pre op goals like working on not drinking while eating. Has been mindful of eating habits.   Wt Readings from Last 3 Encounters:  08/20/14 255 lb 9.6 oz (115.939 kg)  07/21/14 262 lb (118.842 kg)  07/17/14 260 lb (117.935 kg)   Ht Readings from Last 3 Encounters:  08/20/14 5\' 4"  (1.626 m)  07/21/14 5\' 4"  (1.626 m)  07/17/14 5\' 4"  (1.626 m)   Body mass index is 43.85 kg/(m^2). @BMIFA @ Normalized weight-for-age data available only for age 78 to 57 years. Normalized stature-for-age data available only for age 78 to 42 years.   MEDICATIONS: see list  Estimated energy needs: 1400-1600 calories 158-180 g carbohydrates 105-120 g protein 39-44 g fat  Progress Towards Goal(s):  In progress.   Nutritional Diagnosis:  Elizabeth Hawkins-3.3 Overweight/obesity related to past poor dietary habits and physical inactivity as evidenced by patient in SWL for pending bariatric surgery following dietary guidelines for continued weight loss.     Intervention:  Nutrition counseling provided. Plan: Aim to keep sodium around 400-500 mg per meal (or 1500 mg total per day). Work on not drinking 15 minutes before, during, and up through 30 minutes after a meal.  Vitamins/Mineral to take after surgery: 2 x adult dose of multivitamin, 1500 mg of calcium citrate, 300-500 mcg of Vitamin B12 (sublingually).  Try Community education officer. Look into doing some indoor exercise.   Elizabeth Hawkins: [email or web address] or [phone]   Monitoring/Evaluation:  Dietary intake, exercise, pre op goals, and body weight in 4 week(s).

## 2014-08-28 ENCOUNTER — Encounter: Payer: Self-pay | Admitting: Internal Medicine

## 2014-08-28 ENCOUNTER — Other Ambulatory Visit (INDEPENDENT_AMBULATORY_CARE_PROVIDER_SITE_OTHER): Payer: Self-pay

## 2014-08-28 DIAGNOSIS — Z01818 Encounter for other preprocedural examination: Secondary | ICD-10-CM

## 2014-09-10 ENCOUNTER — Ambulatory Visit (INDEPENDENT_AMBULATORY_CARE_PROVIDER_SITE_OTHER): Payer: Medicare Other | Admitting: Family Medicine

## 2014-09-10 ENCOUNTER — Other Ambulatory Visit (INDEPENDENT_AMBULATORY_CARE_PROVIDER_SITE_OTHER): Payer: Self-pay | Admitting: General Surgery

## 2014-09-10 ENCOUNTER — Other Ambulatory Visit (INDEPENDENT_AMBULATORY_CARE_PROVIDER_SITE_OTHER): Payer: Self-pay

## 2014-09-10 VITALS — BP 100/66 | HR 77 | Temp 98.4°F | Resp 16 | Ht 64.25 in | Wt 255.0 lb

## 2014-09-10 DIAGNOSIS — J45909 Unspecified asthma, uncomplicated: Secondary | ICD-10-CM

## 2014-09-10 DIAGNOSIS — R42 Dizziness and giddiness: Secondary | ICD-10-CM

## 2014-09-10 DIAGNOSIS — R0789 Other chest pain: Secondary | ICD-10-CM

## 2014-09-10 DIAGNOSIS — J45998 Other asthma: Secondary | ICD-10-CM | POA: Diagnosis not present

## 2014-09-10 MED ORDER — BECLOMETHASONE DIPROPIONATE 40 MCG/ACT IN AERS: 2.0000 | INHALATION_SPRAY | Freq: Two times a day (BID) | RESPIRATORY_TRACT | Status: DC

## 2014-09-10 MED ORDER — ALBUTEROL SULFATE (2.5 MG/3ML) 0.083% IN NEBU
2.5000 mg | INHALATION_SOLUTION | Freq: Once | RESPIRATORY_TRACT | Status: AC
Start: 2014-09-10 — End: 2014-09-10
  Administered 2014-09-10: 2.5 mg via RESPIRATORY_TRACT

## 2014-09-10 NOTE — Patient Instructions (Signed)
Drink plenty of water to stay well hydrated. That helps keep the secretions the inner.   Use the albuterol inhaler 2 inhalations every 4-6 hours, maximum of 6 times in 24 hours , as directed. After a few days, taper down the use of it as you are doing better and him go back to just using it on an as-needed basis.    use the Qvar inhaler 2 inhalations twice daily. Continue that for at least 3-4 weeks , even if you're Airways are doing better.  If abruptly worse at anytime go to the emergency room or return here if we can be of assistance or see her primary care doctor

## 2014-09-10 NOTE — Progress Notes (Signed)
Subjective:  patient has been having some problems with a chest tightness last couple of days. She feels like she has a hard time breathing deep. She does not smoke. She has a history of some breathing difficulties in the past, uses an albuterol inhaler intermittently. She's been using it twice a day. She is not coughing up much. Mild head congestion. It's just a mild chest tightness. She has had a recent cardiology workup, and everything looked good then. She has high blood pressure and diabetes, both of which are controlled. She sees her doctors regularly. She has not been running any fever. She says she had just mild dizziness which sounds fairly nonspecific.  Objective: TMs are normal. Eyes PERRLA. EOMs intact. She does have minimal nystagmus when she looks both to the right into the left.  Throat clear.Neck supple without nodes or tenderness. No carotid bruits. Chest clear to auscultation. Heart regular without murmurs  EKG normal  Assessment: Chest tightness Dizziness Asthma  Plan:  Drink plenty of water to stay well hydrated. That helps keep the secretions the inner.   Use the albuterol inhaler 2 inhalations every 4-6 hours, maximum of 6 times in 24 hours , as directed. After a few days, taper down the use of it as you are doing better and him go back to just using it on an as-needed basis.    use the Qvar inhaler 2 inhalations twice daily. Continue that for at least 3-4 weeks , even if you're Airways are doing better.  If abruptly worse at anytime go to the emergency room

## 2014-09-17 ENCOUNTER — Encounter: Payer: Medicare Other | Attending: Internal Medicine | Admitting: Dietician

## 2014-09-17 VITALS — Wt 256.4 lb

## 2014-09-17 DIAGNOSIS — Z713 Dietary counseling and surveillance: Secondary | ICD-10-CM | POA: Diagnosis not present

## 2014-09-17 DIAGNOSIS — E669 Obesity, unspecified: Secondary | ICD-10-CM | POA: Insufficient documentation

## 2014-09-17 DIAGNOSIS — Z6841 Body Mass Index (BMI) 40.0 and over, adult: Secondary | ICD-10-CM | POA: Diagnosis not present

## 2014-09-17 NOTE — Progress Notes (Signed)
  Supervised weight loss:  Appt start time: 1105 end time:  1120  SWL visit 6:  Primary concerns today: Elizabeth Hawkins returns today for her 6th SWL visit in preparation for bariatric surgery. Has gained 1 lb since last visit. Still working on not drinking while eating. Likes Premier protein shakes and [place] yogurt and has these for breakfast.   Handout provided: Support group dates  Wt Readings from Last 3 Encounters:  09/17/14 256 lb 6.4 oz (116.302 kg)  09/10/14 255 lb (115.667 kg)  08/20/14 255 lb 9.6 oz (115.939 kg)   Ht Readings from Last 3 Encounters:  09/10/14 5' 4.25" (1.632 m)  08/20/14 5\' 4"  (1.626 m)  07/21/14 5\' 4"  (1.626 m)   There is no weight on file to calculate BMI. @BMIFA @ Normalized weight-for-age data available only for age 49 to 90 years. Normalized stature-for-age data available only for age 49 to 31 years.   MEDICATIONS: see list  Estimated energy needs: 1400-1600 calories 158-180 g carbohydrates 105-120 g protein 39-44 g fat  Progress Towards Goal(s):  In progress.   Nutritional Diagnosis:  -3.3 Overweight/obesity related to past poor dietary habits and physical inactivity as evidenced by patient in SWL for pending bariatric surgery following dietary guidelines for continued weight loss.     Intervention:  Nutrition counseling provided.   Monitoring/Evaluation:  Dietary intake, exercise, pre op goals, and body weight in 4 week(s).

## 2014-09-18 ENCOUNTER — Other Ambulatory Visit (INDEPENDENT_AMBULATORY_CARE_PROVIDER_SITE_OTHER): Payer: Self-pay

## 2014-09-18 DIAGNOSIS — Z01818 Encounter for other preprocedural examination: Secondary | ICD-10-CM

## 2014-09-21 ENCOUNTER — Ambulatory Visit (INDEPENDENT_AMBULATORY_CARE_PROVIDER_SITE_OTHER): Payer: Medicare Other | Admitting: Family Medicine

## 2014-09-21 VITALS — BP 112/68 | HR 71 | Temp 97.8°F | Resp 19 | Ht 64.0 in | Wt 257.4 lb

## 2014-09-21 DIAGNOSIS — R350 Frequency of micturition: Secondary | ICD-10-CM | POA: Diagnosis not present

## 2014-09-21 DIAGNOSIS — E119 Type 2 diabetes mellitus without complications: Secondary | ICD-10-CM | POA: Diagnosis not present

## 2014-09-21 DIAGNOSIS — N952 Postmenopausal atrophic vaginitis: Secondary | ICD-10-CM | POA: Diagnosis not present

## 2014-09-21 DIAGNOSIS — B373 Candidiasis of vulva and vagina: Secondary | ICD-10-CM

## 2014-09-21 DIAGNOSIS — B3731 Acute candidiasis of vulva and vagina: Secondary | ICD-10-CM

## 2014-09-21 LAB — POCT UA - MICROSCOPIC ONLY
CASTS, UR, LPF, POC: NEGATIVE
Crystals, Ur, HPF, POC: NEGATIVE
MUCUS UA: NEGATIVE
Yeast, UA: NEGATIVE

## 2014-09-21 LAB — POCT WET PREP WITH KOH
CLUE CELLS WET PREP PER HPF POC: NEGATIVE
KOH Prep POC: NEGATIVE
RBC Wet Prep HPF POC: NEGATIVE
Trichomonas, UA: NEGATIVE
Yeast Wet Prep HPF POC: NEGATIVE

## 2014-09-21 LAB — POCT URINALYSIS DIPSTICK
Bilirubin, UA: NEGATIVE
GLUCOSE UA: NEGATIVE
KETONES UA: NEGATIVE
Leukocytes, UA: NEGATIVE
NITRITE UA: NEGATIVE
PROTEIN UA: NEGATIVE
RBC UA: NEGATIVE
SPEC GRAV UA: 1.01
Urobilinogen, UA: 0.2
pH, UA: 5.5

## 2014-09-21 MED ORDER — TERCONAZOLE 0.4 % VA CREA: 1.0000 | TOPICAL_CREAM | Freq: Every day | VAGINAL | Status: DC
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
## 2014-09-21 NOTE — Patient Instructions (Signed)
1. Apply cream to area morning and night. 2. There is no evidence of UTI on urine in office; I am sending your urine for further testing.

## 2014-09-23 ENCOUNTER — Ambulatory Visit: Payer: Medicare Other | Admitting: Dietician

## 2014-09-25 ENCOUNTER — Telehealth: Payer: Self-pay

## 2014-09-25 LAB — URINE CULTURE: Colony Count: 60000

## 2014-09-25 MED ORDER — NITROFURANTOIN MONOHYD MACRO 100 MG PO CAPS
100.0000 mg | ORAL_CAPSULE | Freq: Two times a day (BID) | ORAL | Status: DC
Start: 2014-09-25 — End: 2015-01-20

## 2014-09-25 NOTE — Telephone Encounter (Signed)
Pt states she missed her CB from lab. Please advise at [phone]

## 2014-09-25 NOTE — Addendum Note (Signed)
Addended by: Wardell Honour on: 09/25/2014 10:42 AM   Modules accepted: Orders

## 2014-09-26 MED ORDER — FLUCONAZOLE 150 MG PO TABS: 150.0000 mg | ORAL_TABLET | Freq: Once | ORAL | Status: DC

## 2014-09-26 NOTE — Telephone Encounter (Signed)
See labs 

## 2014-09-26 NOTE — Addendum Note (Signed)
Addended by: Constance Goltz on: 09/26/2014 11:10 AM   Modules accepted: Orders

## 2014-09-26 NOTE — Telephone Encounter (Signed)
Pt notified. She is worried about a yeast infection being on 2 antibiotics. Went ahead and sent in some Diflucan for her so that she will have it on hand.

## 2014-10-13 ENCOUNTER — Ambulatory Visit (HOSPITAL_COMMUNITY): Payer: Medicare Other

## 2014-10-13 ENCOUNTER — Other Ambulatory Visit (HOSPITAL_COMMUNITY): Payer: 59

## 2014-10-14 ENCOUNTER — Encounter: Payer: Medicare Other | Attending: Internal Medicine | Admitting: Dietician

## 2014-10-14 VITALS — Ht 64.0 in | Wt 257.0 lb

## 2014-10-14 DIAGNOSIS — E669 Obesity, unspecified: Secondary | ICD-10-CM | POA: Diagnosis not present

## 2014-10-14 DIAGNOSIS — Z6841 Body Mass Index (BMI) 40.0 and over, adult: Secondary | ICD-10-CM | POA: Diagnosis not present

## 2014-10-14 DIAGNOSIS — Z713 Dietary counseling and surveillance: Secondary | ICD-10-CM | POA: Diagnosis not present

## 2014-10-14 NOTE — Patient Instructions (Addendum)
Continue working on not drinking 15 minutes before, during, and up through 30 minutes after a meal.  Vitamins/Mineral to take after surgery: 2 x adult dose of multivitamin, 1500 mg of calcium citrate, 300-500 mcg of Vitamin B12 (sublingually).  Call [facility] when surgery is scheduled to enroll in Pre-Op Class.  Sherion Godwin: [email or web address] or [phone]

## 2014-10-14 NOTE — Progress Notes (Signed)
  Supervised weight loss:  Appt start time: 1010 end time:  1025  SWL visit 7:  Primary concerns today: Lemon returns today for her 7th SWL visit in preparation for bariatric surgery. Has no weight change since last visit. Still working on not drinking while eating. Likes Premier protein shakes and [place] yogurt and has these for breakfast. Attended support group.    Wt Readings from Last 3 Encounters:  10/14/14 257 lb (116.574 kg)  09/21/14 257 lb 6.4 oz (116.756 kg)  09/17/14 256 lb 6.4 oz (116.302 kg)   Ht Readings from Last 3 Encounters:  10/14/14 5\' 4"  (1.626 m)  09/21/14 5\' 4"  (1.626 m)  09/10/14 5' 4.25" (1.632 m)   Body mass index is 44.09 kg/(m^2). @BMIFA @ Normalized weight-for-age data available only for age 102 to 64 years. Normalized stature-for-age data available only for age 102 to 59 years.   MEDICATIONS: see list  Estimated energy needs: 1400-1600 calories 158-180 g carbohydrates 105-120 g protein 39-44 g fat  Progress Towards Goal(s):  In progress.   Nutritional Diagnosis:  [place]-3.3 Overweight/obesity related to past poor dietary habits and physical inactivity as evidenced by patient in SWL for pending bariatric surgery following dietary guidelines for continued weight loss.     Intervention:  Nutrition counseling provided.   Monitoring/Evaluation:  Dietary intake, exercise, pre op goals, and body weight. Return for Pre-Op class.

## 2014-10-17 ENCOUNTER — Ambulatory Visit: Payer: Medicare Other | Admitting: Dietician

## 2014-10-22 ENCOUNTER — Ambulatory Visit (HOSPITAL_COMMUNITY)
Admission: RE | Admit: 2014-10-22 | Discharge: 2014-10-22 | Disposition: A | Discharge status: Home / Self Care | Payer: Medicare Other | Source: Ambulatory Visit | Attending: General Surgery | Admitting: General Surgery

## 2014-10-22 DIAGNOSIS — I10 Essential (primary) hypertension: Secondary | ICD-10-CM | POA: Diagnosis not present

## 2014-10-22 DIAGNOSIS — D649 Anemia, unspecified: Secondary | ICD-10-CM | POA: Diagnosis not present

## 2014-10-22 DIAGNOSIS — E559 Vitamin D deficiency, unspecified: Secondary | ICD-10-CM | POA: Diagnosis not present

## 2014-10-22 DIAGNOSIS — Z87891 Personal history of nicotine dependence: Secondary | ICD-10-CM | POA: Insufficient documentation

## 2014-10-22 DIAGNOSIS — E785 Hyperlipidemia, unspecified: Secondary | ICD-10-CM | POA: Diagnosis not present

## 2014-10-22 DIAGNOSIS — Z0000 Encounter for general adult medical examination without abnormal findings: Secondary | ICD-10-CM | POA: Diagnosis not present

## 2014-10-22 DIAGNOSIS — E669 Obesity, unspecified: Secondary | ICD-10-CM | POA: Diagnosis not present

## 2014-10-22 DIAGNOSIS — R5382 Chronic fatigue, unspecified: Secondary | ICD-10-CM | POA: Diagnosis not present

## 2014-10-22 DIAGNOSIS — Z01818 Encounter for other preprocedural examination: Secondary | ICD-10-CM | POA: Insufficient documentation

## 2014-10-22 DIAGNOSIS — E119 Type 2 diabetes mellitus without complications: Secondary | ICD-10-CM | POA: Diagnosis not present

## 2014-10-22 DIAGNOSIS — E538 Deficiency of other specified B group vitamins: Secondary | ICD-10-CM | POA: Diagnosis not present

## 2014-10-22 DIAGNOSIS — K219 Gastro-esophageal reflux disease without esophagitis: Secondary | ICD-10-CM | POA: Diagnosis not present

## 2014-10-22 DIAGNOSIS — R079 Chest pain, unspecified: Secondary | ICD-10-CM | POA: Diagnosis not present

## 2014-10-29 ENCOUNTER — Ambulatory Visit (HOSPITAL_COMMUNITY)
Admission: RE | Admit: 2014-10-29 | Discharge: 2014-10-29 | Disposition: A | Discharge status: Home / Self Care | Payer: Medicare Other | Source: Ambulatory Visit | Attending: General Surgery | Admitting: General Surgery

## 2014-10-29 ENCOUNTER — Encounter (HOSPITAL_COMMUNITY)
Admission: RE | Disposition: A | Discharge status: Home / Self Care | Payer: Medicare Other | Source: Ambulatory Visit | Attending: General Surgery

## 2014-10-29 HISTORY — PX: BREATH TEK H PYLORI: SHX5422

## 2014-10-29 SURGERY — BREATH TEST, FOR HELICOBACTER PYLORI

## 2014-10-29 NOTE — Progress Notes (Signed)
   10/29/14 1024  BREATH TEK ASSESSMENT  Referring MD Dr Silva Bandy Hoxworth  Time of Last PO Intake 2000  Baseline Breath At: 0812  Pranactin Given At: 0815  Post-Dose Breath At: 0830  Sample 1 2.4%  Sample 2 1.9%  Test Negative

## 2014-10-30 ENCOUNTER — Ambulatory Visit (INDEPENDENT_AMBULATORY_CARE_PROVIDER_SITE_OTHER): Payer: Medicare Other | Admitting: Psychiatry

## 2014-10-30 ENCOUNTER — Encounter (HOSPITAL_COMMUNITY): Payer: Self-pay | Admitting: General Surgery

## 2014-11-10 ENCOUNTER — Ambulatory Visit: Payer: Self-pay

## 2014-11-12 ENCOUNTER — Ambulatory Visit: Payer: Medicare Other | Admitting: Psychiatry

## 2014-11-13 ENCOUNTER — Ambulatory Visit (INDEPENDENT_AMBULATORY_CARE_PROVIDER_SITE_OTHER): Payer: Medicare Other | Admitting: Psychiatry

## 2014-11-17 ENCOUNTER — Other Ambulatory Visit: Payer: Self-pay | Admitting: General Surgery

## 2014-11-19 ENCOUNTER — Other Ambulatory Visit: Payer: Self-pay | Admitting: Internal Medicine

## 2014-11-24 ENCOUNTER — Encounter: Payer: Medicare Other | Attending: Internal Medicine

## 2014-11-24 VITALS — Ht 64.0 in | Wt 255.5 lb

## 2014-11-24 DIAGNOSIS — Z6841 Body Mass Index (BMI) 40.0 and over, adult: Secondary | ICD-10-CM | POA: Insufficient documentation

## 2014-11-24 DIAGNOSIS — Z713 Dietary counseling and surveillance: Secondary | ICD-10-CM | POA: Diagnosis not present

## 2014-11-24 DIAGNOSIS — E669 Obesity, unspecified: Secondary | ICD-10-CM | POA: Diagnosis not present

## 2014-11-25 NOTE — Progress Notes (Signed)
  Pre-Operative Nutrition Class:  Appt start time: 830   End time:  930.  Patient was seen on 11/24/14 for Pre-Operative Bariatric Surgery Education at the [facility].   Surgery date: 12/08/14 Surgery type: RYGB Start weight at [facility]: 262.5 lbs on 04/07/14 Weight today: 255.5 lbs  TANITA  BODY COMP RESULTS  11/24/14   BMI (kg/m^2) 43.9   Fat Mass (lbs) 139   Fat Free Mass (lbs) 116.5   Total Body Water (lbs) 85.5   Samples given per MNT protocol. Patient educated on appropriate usage: PB2 (chocolate - qty 1) Lot #: None Exp: 11/16  Premier protein shake (strawberry - qty 1) Lot #: [id number] Exp: 9/16  Bariatric Advantage calcium citrate chew (caramel - qty 1) Lot #: [id number] Exp: 5/16   The following the learning objectives were met by the patient during this course:  Identify Pre-Op Dietary Goals and will begin 2 weeks pre-operatively  Identify appropriate sources of fluids and proteins   State protein recommendations and appropriate sources pre and post-operatively  Identify Post-Operative Dietary Goals and will follow for 2 weeks post-operatively  Identify appropriate multivitamin and calcium sources  Describe the need for physical activity post-operatively and will follow MD recommendations  State when to call healthcare provider regarding medication questions or post-operative complications  Handouts given during class include:  Pre-Op Bariatric Surgery Diet Handout  Protein Shake Handout  Post-Op Bariatric Surgery Nutrition Handout  BELT Program Information Flyer  Support Group Information Flyer  [facility] Bariatric Supplements Price List  Follow-Up Plan: Patient will follow-up at [facility] 2 weeks post operatively for diet advancement per MD.

## 2014-11-26 NOTE — Patient Instructions (Addendum)
Elizabeth Hawkins  11/26/2014   Your procedure is scheduled on: Monday 12/08/2014  Report to [facility] Main  Entrance and follow signs to               [place] at  [place] AM.  Call this number if you have problems the morning of surgery [phone]   Remember: [place] DR. [facility]'S OFFICE GIVES YOU FOR PREPARING YOU FOR YOUR SURGERY!   Do not eat food or drink liquids :After Midnight.     Take these medicines the morning of surgery with A SIP OF WATER: use Albuterol inhaler if needed, use Qvar inhaler, Levothyroxine, Pantoprazole                               You may not have any metal on your body including hair pins and              piercings  Do not wear jewelry, make-up, lotions, powders or perfumes.             Do not wear nail polish.  Do not shave  48 hours prior to surgery.              Men may shave face and neck.   Do not bring valuables to the hospital. [place].  Contacts, dentures or bridgework may not be worn into surgery.  Leave suitcase in the car. After surgery it may be brought to your room.     Patients discharged the day of surgery will not be allowed to drive home.  Name and phone number of your driver:  Special Instructions: N/A              Please read over the following fact sheets you were given: _____________________________________________________________________             [facility] - Preparing for Surgery Before surgery, you can play an important role.  Because skin is not sterile, your skin needs to be as free of germs as possible.  You can reduce the number of germs on your skin by washing with CHG (chlorahexidine gluconate) soap before surgery.  CHG is an antiseptic cleaner which kills germs and bonds with the skin to continue killing germs even after washing. Please DO NOT use if you have an allergy to CHG or antibacterial soaps.  If your  skin becomes reddened/irritated stop using the CHG and inform your nurse when you arrive at Short Stay. Do not shave (including legs and underarms) for at least 48 hours prior to the first CHG shower.  You may shave your face/neck. Please follow these instructions carefully:  1.  Shower with CHG Soap the night before surgery and the  morning of Surgery.  2.  If you choose to wash your hair, wash your hair first as usual with your  normal  shampoo.  3.  After you shampoo, rinse your hair and body thoroughly to remove the  shampoo.                           4.  Use CHG as you would any other liquid soap.  You can apply chg directly  to the  skin and wash                       Gently with a scrungie or clean washcloth.  5.  Apply the CHG Soap to your body ONLY FROM THE NECK DOWN.   Do not use on face/ open                           Wound or open sores. Avoid contact with eyes, ears mouth and genitals (private parts).                       Wash face,  Genitals (private parts) with your normal soap.             6.  Wash thoroughly, paying special attention to the area where your surgery  will be performed.  7.  Thoroughly rinse your body with warm water from the neck down.  8.  DO NOT shower/wash with your normal soap after using and rinsing off  the CHG Soap.                9.  Pat yourself dry with a clean towel.            10.  Wear clean pajamas.            11.  Place clean sheets on your bed the night of your first shower and do not  sleep with pets. Day of Surgery : Do not apply any lotions/deodorants the morning of surgery.  Please wear clean clothes to the hospital/surgery center.  FAILURE TO FOLLOW THESE INSTRUCTIONS MAY RESULT IN THE CANCELLATION OF YOUR SURGERY PATIENT SIGNATURE_________________________________  NURSE SIGNATURE__________________________________  ________________________________________________________________________   Adam Phenix  An incentive spirometer  is a tool that can help keep your lungs clear and active. This tool measures how well you are filling your lungs with each breath. Taking long deep breaths may help reverse or decrease the chance of developing breathing (pulmonary) problems (especially infection) following:  A long period of time when you are unable to move or be active. BEFORE THE PROCEDURE   If the spirometer includes an indicator to show your best effort, your nurse or respiratory therapist will set it to a desired goal.  If possible, sit up straight or lean slightly forward. Try not to slouch.  Hold the incentive spirometer in an upright position. INSTRUCTIONS FOR USE  1. Sit on the edge of your bed if possible, or sit up as far as you can in bed or on a chair. 2. Hold the incentive spirometer in an upright position. 3. Breathe out normally. 4. Place the mouthpiece in your mouth and seal your lips tightly around it. 5. Breathe in slowly and as deeply as possible, raising the piston or the ball toward the top of the column. 6. Hold your breath for 3-5 seconds or for as long as possible. Allow the piston or ball to fall to the bottom of the column. 7. Remove the mouthpiece from your mouth and breathe out normally. 8. Rest for a few seconds and repeat Steps 1 through 7 at least 10 times every 1-2 hours when you are awake. Take your time and take a few normal breaths between deep breaths. 9. The spirometer may include an indicator to show your best effort. Use the indicator as a goal to work toward during each repetition. 10. After each set of  10 deep breaths, practice coughing to be sure your lungs are clear. If you have an incision (the cut made at the time of surgery), support your incision when coughing by placing a pillow or rolled up towels firmly against it. Once you are able to get out of bed, walk around indoors and cough well. You may stop using the incentive spirometer when instructed by your caregiver.  RISKS AND  COMPLICATIONS  Take your time so you do not get dizzy or light-headed.  If you are in pain, you may need to take or ask for pain medication before doing incentive spirometry. It is harder to take a deep breath if you are having pain. AFTER USE  Rest and breathe slowly and easily.  It can be helpful to keep track of a log of your progress. Your caregiver can provide you with a simple table to help with this. If you are using the spirometer at home, follow these instructions: [place] IF:   You are having difficultly using the spirometer.  You have trouble using the spirometer as often as instructed.  Your pain medication is not giving enough relief while using the spirometer.  You develop fever of 100.5 F (38.1 C) or higher. SEEK IMMEDIATE MEDICAL CARE IF:   You cough up bloody sputum that had not been present before.  You develop fever of 102 F (38.9 C) or greater.  You develop worsening pain at or near the incision site. MAKE SURE YOU:   Understand these instructions.  Will watch your condition.  Will get help right away if you are not doing well or get worse. Document Released: 12/05/2006 Document Revised: 10/17/2011 Document Reviewed: 02/05/2007 [facility] Patient Information 2014 [place], [place].   ________________________________________________________________________

## 2014-11-26 NOTE — Progress Notes (Signed)
09/10/14-EKG in [facility] 10/22/2014-2 view CXR in [facility] 05/02/2013-2 D Echo in [facility]

## 2014-11-28 ENCOUNTER — Encounter (HOSPITAL_COMMUNITY)
Admission: RE | Admit: 2014-11-28 | Discharge: 2014-11-28 | Disposition: A | Discharge status: Home / Self Care | Payer: Medicare Other | Source: Ambulatory Visit | Attending: General Surgery | Admitting: General Surgery

## 2014-11-28 ENCOUNTER — Encounter (HOSPITAL_COMMUNITY): Payer: Self-pay

## 2014-11-28 DIAGNOSIS — Z01812 Encounter for preprocedural laboratory examination: Secondary | ICD-10-CM | POA: Insufficient documentation

## 2014-11-28 DIAGNOSIS — E119 Type 2 diabetes mellitus without complications: Secondary | ICD-10-CM | POA: Insufficient documentation

## 2014-11-28 DIAGNOSIS — I10 Essential (primary) hypertension: Secondary | ICD-10-CM | POA: Insufficient documentation

## 2014-11-28 LAB — CBC WITH DIFFERENTIAL/PLATELET
BASOS ABS: 0 10*3/uL (ref 0.0–0.1)
Basophils Relative: 0 % (ref 0–1)
EOS ABS: 0.2 10*3/uL (ref 0.0–0.7)
EOS PCT: 3 % (ref 0–5)
HEMATOCRIT: 32.1 % — AB (ref 36.0–46.0)
Hemoglobin: 9.9 g/dL — ABNORMAL LOW (ref 12.0–15.0)
LYMPHS PCT: 46 % (ref 12–46)
Lymphs Abs: 3.3 10*3/uL (ref 0.7–4.0)
MCH: 23.2 pg — AB (ref 26.0–34.0)
MCHC: 30.8 g/dL (ref 30.0–36.0)
MCV: 75.2 fL — AB (ref 78.0–100.0)
MONO ABS: 0.6 10*3/uL (ref 0.1–1.0)
Monocytes Relative: 9 % (ref 3–12)
Neutro Abs: 2.9 10*3/uL (ref 1.7–7.7)
Neutrophils Relative %: 42 % — ABNORMAL LOW (ref 43–77)
Platelets: 263 10*3/uL (ref 150–400)
RBC: 4.27 MIL/uL (ref 3.87–5.11)
RDW: 13.9 % (ref 11.5–15.5)
WBC: 7 10*3/uL (ref 4.0–10.5)

## 2014-11-28 LAB — COMPREHENSIVE METABOLIC PANEL
ALT: 16 U/L (ref 0–35)
AST: 20 U/L (ref 0–37)
Albumin: 4 g/dL (ref 3.5–5.2)
Alkaline Phosphatase: 48 U/L (ref 39–117)
Anion gap: 8 (ref 5–15)
BUN: 25 mg/dL — ABNORMAL HIGH (ref 6–23)
CO2: 27 mmol/L (ref 19–32)
Calcium: 9.8 mg/dL (ref 8.4–10.5)
Chloride: 104 mmol/L (ref 96–112)
Creatinine, Ser: 1.08 mg/dL (ref 0.50–1.10)
GFR calc Af Amer: 65 mL/min — ABNORMAL LOW (ref >90)
GFR calc non Af Amer: 56 mL/min — ABNORMAL LOW (ref >90)
Glucose, Bld: 113 mg/dL — ABNORMAL HIGH (ref 70–99)
Potassium: 4 mmol/L (ref 3.5–5.1)
SODIUM: 139 mmol/L (ref 135–145)
TOTAL PROTEIN: 7.8 g/dL (ref 6.0–8.3)
Total Bilirubin: 0.7 mg/dL (ref 0.3–1.2)

## 2014-12-03 DIAGNOSIS — Z6841 Body Mass Index (BMI) 40.0 and over, adult: Secondary | ICD-10-CM | POA: Diagnosis not present

## 2014-12-06 ENCOUNTER — Ambulatory Visit (INDEPENDENT_AMBULATORY_CARE_PROVIDER_SITE_OTHER): Payer: Medicare Other | Admitting: Family Medicine

## 2014-12-06 VITALS — BP 114/70 | HR 68 | Temp 98.1°F | Resp 18 | Ht 64.5 in | Wt 253.8 lb

## 2014-12-06 DIAGNOSIS — K648 Other hemorrhoids: Secondary | ICD-10-CM | POA: Diagnosis not present

## 2014-12-06 NOTE — Progress Notes (Deleted)
   Subjective:    Patient ID: Elizabeth Hawkins, female    DOB: 1956-07-20, 59 y.o.   MRN: [id number]  HPI    Review of Systems     Objective:   Physical Exam        Assessment & Plan:
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
## 2014-12-06 NOTE — Patient Instructions (Signed)
About Hemorrhoids  Hemorrhoids are swollen veins in the lower rectum and anus.  Also called piles, hemorrhoids are a common problem.  Hemorrhoids may be internal (inside the rectum) or external (around the anus).  Internal Hemorrhoids  Internal hemorrhoids are often painless, but they rarely cause bleeding.  The internal veins may stretch and fall down (prolapse) through the anus to the outside of the body.  The veins may then become irritated and painful.  External Hemorrhoids  External hemorrhoids can be easily seen or felt around the anal opening.  They are under the skin around the anus.  When the swollen veins are scratched or broken by straining, rubbing or wiping they sometimes bleed.  How Hemorrhoids Occur  Veins in the rectum and around the anus tend to swell under pressure.  Hemorrhoids can result from increased pressure in the veins of your anus or rectum.  Some sources of pressure are:   Straining to have a bowel movement because of constipation  Waiting too long to have a bowel movement  Coughing and sneezing often  Sitting for extended periods of time, including on the toilet  Diarrhea  Obesity  Trauma or injury to the anus  Some liver diseases  Stress  Family history of hemorrhoids  Pregnancy  Pregnant women should try to avoid becoming constipated, because they are more likely to have hemorrhoids during pregnancy.  In the last trimester of pregnancy, the enlarged uterus may press on blood vessels and causes hemorrhoids.  In addition, the strain of childbirth sometimes causes hemorrhoids after the birth.  Symptoms of Hemorrhoids  Some symptoms of hemorrhoids include:  Swelling and/or a tender lump around the anus  Itching, mild burning and bleeding around the anus  Painful bowel movements with or without constipation  Bright red blood covering the stool, on toilet paper or in the toilet bowel.   Symptoms usually go away within a few days.  Always  talk to your doctor about any bleeding to make sure it is not from some other causes.  Diagnosing and Treating Hemorrhoids  Diagnosis is made by an examination by your healthcare provider.  Special test can be performed by your doctor.    Most cases of hemorrhoids can be treated with:  High-fiber diet: Eat more high-fiber foods, which help prevent constipation.  Ask for more detailed fiber information on types and sources of fiber from your healthcare provider.  Fluids: Drink plenty of water.  This helps soften bowel movements so they are easier to pass.  Sitz baths and cold packs: Sitting in lukewarm water two or three times a day for 15 minutes cleases the anal area and may relieve discomfort.  If the water is too hot, swelling around the anus will get worse.  Placing a cloth-covered ice pack on the anus for ten minutes four times a day can also help reduce selling.  Gently pushing a prolapsed hemorrhoid back inside after the bath or ice pack can be helpful.  Medications: For mild discomfort, your healthcare provider may suggest over-the-counter pain medication or prescribe a cream or ointment for topical use.  The cream may contain witch hazel, zinc oxide or petroleum jelly.  Medicated suppositories are also a treatment option.  Always consult your doctor before applying medications or creams.  Procedures and surgeries: There are also a number of procedures and surgeries to shrink or remove hemorrhoids in more serious cases.  Talk to your physician about these options.  You can often prevent hemorrhoids or keep   them from becoming worse by maintaining a healthy lifestyle.  Eat a fiber-rich diet of fruits, vegetables and whole grains.  Also, drink plenty of water and exercise regularly.   2007, [employer] Doc.30  Hemorrhoids Hemorrhoids are swollen veins around the rectum or anus. There are two types of hemorrhoids:  12. Internal hemorrhoids. These occur in the veins just  inside the rectum. They may poke through to the outside and become irritated and painful. 13. External hemorrhoids. These occur in the veins outside the anus and can be felt as a painful swelling or hard lump near the anus. CAUSES 5. Pregnancy.  6. Obesity.  7. Constipation or diarrhea.  8. Straining to have a bowel movement.  9. Sitting for long periods on the toilet. 10. Heavy lifting or other activity that caused you to strain. 11. Anal intercourse. SYMPTOMS  6. Pain.  7. Anal itching or irritation.  8. Rectal bleeding.  9. Fecal leakage.  10. Anal swelling.  11. One or more lumps around the anus.  DIAGNOSIS  Your caregiver may be able to diagnose hemorrhoids by visual examination. Other examinations or tests that may be performed include:   Examination of the rectal area with a gloved hand (digital rectal exam).   Examination of anal canal using a small tube (scope).   A blood test if you have lost a significant amount of blood.  A test to look inside the colon (sigmoidoscopy or colonoscopy). TREATMENT Most hemorrhoids can be treated at home. However, if symptoms do not seem to be getting better or if you have a lot of rectal bleeding, your caregiver may perform a procedure to help make the hemorrhoids get smaller or remove them completely. Possible treatments include:   Placing a rubber band at the base of the hemorrhoid to cut off the circulation (rubber band ligation).   Injecting a chemical to shrink the hemorrhoid (sclerotherapy).   Using a tool to burn the hemorrhoid (infrared light therapy).   Surgically removing the hemorrhoid (hemorrhoidectomy).   Stapling the hemorrhoid to block blood flow to the tissue (hemorrhoid stapling).  HOME CARE INSTRUCTIONS   Eat foods with fiber, such as whole grains, beans, nuts, fruits, and vegetables. Ask your doctor about taking products with added fiber in them (fibersupplements).  Increase fluid intake. Drink  enough water and fluids to keep your urine clear or pale yellow.   Exercise regularly.   Go to the bathroom when you have the urge to have a bowel movement. Do not wait.   Avoid straining to have bowel movements.   Keep the anal area dry and clean. Use wet toilet paper or moist towelettes after a bowel movement.   Medicated creams and suppositories may be used or applied as directed.   Only take over-the-counter or prescription medicines as directed by your caregiver.   Take warm sitz baths for 15-20 minutes, 3-4 times a day to ease pain and discomfort.   Place ice packs on the hemorrhoids if they are tender and swollen. Using ice packs between sitz baths may be helpful.   Put ice in a plastic bag.   Place a towel between your skin and the bag.   Leave the ice on for 15-20 minutes, 3-4 times a day.   Do not use a donut-shaped pillow or sit on the toilet for long periods. This increases blood pooling and pain.  SEEK MEDICAL CARE IF:  You have increasing pain and swelling that is not controlled by treatment or medicine.  You have uncontrolled bleeding.  You have difficulty or you are unable to have a bowel movement.  You have pain or inflammation outside the area of the hemorrhoids. MAKE SURE YOU:  Understand these instructions.  Will watch your condition.  Will get help right away if you are not doing well or get worse. Document Released: 07/22/2000 Document Revised: 07/11/2012 Document Reviewed: 05/29/2012 [facility] Patient Information 2015 [place], [place]. This information is not intended to replace advice given to you by your health care provider. Make sure you discuss any questions you have with your health care provider.

## 2014-12-08 ENCOUNTER — Encounter (HOSPITAL_COMMUNITY)
Admission: RE | Disposition: A | Discharge status: Home / Self Care | Payer: Self-pay | Source: Ambulatory Visit | Attending: General Surgery

## 2014-12-08 ENCOUNTER — Inpatient Hospital Stay (HOSPITAL_COMMUNITY): Payer: Medicare Other | Admitting: Certified Registered"

## 2014-12-08 ENCOUNTER — Inpatient Hospital Stay (HOSPITAL_COMMUNITY)
Admission: RE | Admit: 2014-12-08 | Discharge: 2014-12-10 | DRG: 621 | Disposition: A | Discharge status: Home / Self Care | Payer: Medicare Other | Source: Ambulatory Visit | Attending: General Surgery | Admitting: General Surgery

## 2014-12-08 ENCOUNTER — Encounter (HOSPITAL_COMMUNITY): Payer: Self-pay | Admitting: Anesthesiology

## 2014-12-08 DIAGNOSIS — E119 Type 2 diabetes mellitus without complications: Secondary | ICD-10-CM | POA: Diagnosis not present

## 2014-12-08 DIAGNOSIS — Z90710 Acquired absence of both cervix and uterus: Secondary | ICD-10-CM

## 2014-12-08 DIAGNOSIS — Z87891 Personal history of nicotine dependence: Secondary | ICD-10-CM | POA: Diagnosis not present

## 2014-12-08 DIAGNOSIS — E785 Hyperlipidemia, unspecified: Secondary | ICD-10-CM | POA: Diagnosis present

## 2014-12-08 DIAGNOSIS — K219 Gastro-esophageal reflux disease without esophagitis: Secondary | ICD-10-CM | POA: Diagnosis present

## 2014-12-08 DIAGNOSIS — K660 Peritoneal adhesions (postprocedural) (postinfection): Secondary | ICD-10-CM | POA: Diagnosis present

## 2014-12-08 DIAGNOSIS — I10 Essential (primary) hypertension: Secondary | ICD-10-CM | POA: Diagnosis not present

## 2014-12-08 DIAGNOSIS — Z6841 Body Mass Index (BMI) 40.0 and over, adult: Secondary | ICD-10-CM | POA: Diagnosis not present

## 2014-12-08 DIAGNOSIS — G4730 Sleep apnea, unspecified: Secondary | ICD-10-CM | POA: Diagnosis present

## 2014-12-08 DIAGNOSIS — K432 Incisional hernia without obstruction or gangrene: Secondary | ICD-10-CM | POA: Diagnosis present

## 2014-12-08 DIAGNOSIS — K439 Ventral hernia without obstruction or gangrene: Secondary | ICD-10-CM | POA: Diagnosis not present

## 2014-12-08 DIAGNOSIS — M1990 Unspecified osteoarthritis, unspecified site: Secondary | ICD-10-CM | POA: Diagnosis present

## 2014-12-08 DIAGNOSIS — Z9884 Bariatric surgery status: Secondary | ICD-10-CM | POA: Diagnosis not present

## 2014-12-08 DIAGNOSIS — K429 Umbilical hernia without obstruction or gangrene: Secondary | ICD-10-CM | POA: Diagnosis present

## 2014-12-08 HISTORY — PX: GASTRIC ROUX-EN-Y: SHX5262

## 2014-12-08 LAB — GLUCOSE, CAPILLARY
Glucose-Capillary: 106 mg/dL — ABNORMAL HIGH (ref 70–99)
Glucose-Capillary: 143 mg/dL — ABNORMAL HIGH (ref 70–99)
Glucose-Capillary: 162 mg/dL — ABNORMAL HIGH (ref 70–99)

## 2014-12-08 LAB — HEMOGLOBIN AND HEMATOCRIT, BLOOD
HCT: 34 % — ABNORMAL LOW (ref 36.0–46.0)
Hemoglobin: 10.2 g/dL — ABNORMAL LOW (ref 12.0–15.0)

## 2014-12-08 SURGERY — LAPAROSCOPIC ROUX-EN-Y GASTRIC
Anesthesia: General | Site: Abdomen

## 2014-12-08 MED ORDER — LIDOCAINE HCL (CARDIAC) 20 MG/ML IV SOLN
INTRAVENOUS | Status: DC | PRN
  Administered 2014-12-08: 100 mg via INTRAVENOUS

## 2014-12-08 MED ORDER — UNJURY VANILLA POWDER: 2.0000 [oz_av] | Freq: Four times a day (QID) | ORAL | Status: DC

## 2014-12-08 MED ORDER — PROPOFOL 10 MG/ML IV BOLUS
INTRAVENOUS | Status: AC
  Filled 2014-12-08: qty 20

## 2014-12-08 MED ORDER — GLYCOPYRROLATE 0.2 MG/ML IJ SOLN
INTRAMUSCULAR | Status: AC
  Filled 2014-12-08: qty 3

## 2014-12-08 MED ORDER — HYDROMORPHONE HCL 1 MG/ML IJ SOLN
INTRAMUSCULAR | Status: AC
  Filled 2014-12-08: qty 1

## 2014-12-08 MED ORDER — FENTANYL CITRATE (PF) 250 MCG/5ML IJ SOLN
INTRAMUSCULAR | Status: DC | PRN
  Administered 2014-12-08: 50 ug via INTRAVENOUS
  Administered 2014-12-08: 100 ug via INTRAVENOUS
  Administered 2014-12-08 (×2): 50 ug via INTRAVENOUS
  Administered 2014-12-08: 100 ug via INTRAVENOUS
  Administered 2014-12-08: 50 ug via INTRAVENOUS

## 2014-12-08 MED ORDER — LACTATED RINGERS IV SOLN
INTRAVENOUS | Status: DC
  Administered 2014-12-08 (×2): 1000 mL via INTRAVENOUS

## 2014-12-08 MED ORDER — ONDANSETRON HCL 4 MG/2ML IJ SOLN
INTRAMUSCULAR | Status: DC | PRN
  Administered 2014-12-08: 4 mg via INTRAVENOUS

## 2014-12-08 MED ORDER — DEXTROSE 5 % IV SOLN
INTRAVENOUS | Status: AC
  Filled 2014-12-08: qty 2

## 2014-12-08 MED ORDER — MIDAZOLAM HCL 2 MG/2ML IJ SOLN
INTRAMUSCULAR | Status: DC | PRN
  Administered 2014-12-08: 2 mg via INTRAVENOUS

## 2014-12-08 MED ORDER — BUPIVACAINE-EPINEPHRINE 0.25% -1:200000 IJ SOLN
INTRAMUSCULAR | Status: AC
  Filled 2014-12-08: qty 1

## 2014-12-08 MED ORDER — UNJURY CHOCOLATE CLASSIC POWDER
2.0000 [oz_av] | Freq: Four times a day (QID) | ORAL | Status: DC
  Administered 2014-12-10: 2 [oz_av] via ORAL

## 2014-12-08 MED ORDER — POTASSIUM CHLORIDE IN NACL 20-0.9 MEQ/L-% IV SOLN
INTRAVENOUS | Status: DC
  Administered 2014-12-08 – 2014-12-09 (×3): via INTRAVENOUS
  Filled 2014-12-08 (×6): qty 1000

## 2014-12-08 MED ORDER — CHLORHEXIDINE GLUCONATE CLOTH 2 % EX PADS
6.0000 | MEDICATED_PAD | Freq: Once | CUTANEOUS | Status: DC
Start: 2014-12-08 — End: 2014-12-08

## 2014-12-08 MED ORDER — ROCURONIUM BROMIDE 100 MG/10ML IV SOLN
INTRAVENOUS | Status: AC
  Filled 2014-12-08: qty 1

## 2014-12-08 MED ORDER — HEPARIN SODIUM (PORCINE) 5000 UNIT/ML IJ SOLN
5000.0000 [IU] | Freq: Three times a day (TID) | INTRAMUSCULAR | Status: DC
  Administered 2014-12-08 – 2014-12-10 (×5): 5000 [IU] via SUBCUTANEOUS
  Filled 2014-12-08 (×8): qty 1

## 2014-12-08 MED ORDER — HEPARIN SODIUM (PORCINE) 5000 UNIT/ML IJ SOLN
5000.0000 [IU] | INTRAMUSCULAR | Status: AC
  Administered 2014-12-08: 5000 [IU] via SUBCUTANEOUS
  Filled 2014-12-08: qty 1

## 2014-12-08 MED ORDER — ONDANSETRON HCL 4 MG/2ML IJ SOLN: 4.0000 mg | INTRAMUSCULAR | Status: DC | PRN

## 2014-12-08 MED ORDER — LABETALOL HCL 5 MG/ML IV SOLN
INTRAVENOUS | Status: AC
  Filled 2014-12-08: qty 4

## 2014-12-08 MED ORDER — INSULIN ASPART 100 UNIT/ML ~~LOC~~ SOLN
0.0000 [IU] | SUBCUTANEOUS | Status: DC
  Administered 2014-12-08: 2 [IU] via SUBCUTANEOUS
  Administered 2014-12-09: 3 [IU] via SUBCUTANEOUS
  Administered 2014-12-09: 2 [IU] via SUBCUTANEOUS

## 2014-12-08 MED ORDER — SUCCINYLCHOLINE CHLORIDE 20 MG/ML IJ SOLN
INTRAMUSCULAR | Status: DC | PRN
  Administered 2014-12-08: 100 mg via INTRAVENOUS

## 2014-12-08 MED ORDER — FENTANYL CITRATE (PF) 250 MCG/5ML IJ SOLN
INTRAMUSCULAR | Status: AC
  Filled 2014-12-08: qty 5

## 2014-12-08 MED ORDER — MIDAZOLAM HCL 2 MG/2ML IJ SOLN
INTRAMUSCULAR | Status: AC
  Filled 2014-12-08: qty 2

## 2014-12-08 MED ORDER — MORPHINE SULFATE 2 MG/ML IJ SOLN
2.0000 mg | INTRAMUSCULAR | Status: DC | PRN
  Administered 2014-12-09 – 2014-12-10 (×4): 2 mg via INTRAVENOUS
  Filled 2014-12-08 (×4): qty 1

## 2014-12-08 MED ORDER — CHLORHEXIDINE GLUCONATE CLOTH 2 % EX PADS: 6.0000 | MEDICATED_PAD | Freq: Once | CUTANEOUS | Status: DC

## 2014-12-08 MED ORDER — BUPIVACAINE-EPINEPHRINE 0.25% -1:200000 IJ SOLN
INTRAMUSCULAR | Status: DC | PRN
  Administered 2014-12-08: 35 mL

## 2014-12-08 MED ORDER — LABETALOL HCL 5 MG/ML IV SOLN
INTRAVENOUS | Status: DC | PRN
Start: 2014-12-08 — End: 2014-12-08
  Administered 2014-12-08: 5 mg via INTRAVENOUS

## 2014-12-08 MED ORDER — TISSEEL VH 10 ML EX KIT
PACK | CUTANEOUS | Status: DC | PRN
  Administered 2014-12-08: 20 mL

## 2014-12-08 MED ORDER — ENALAPRILAT 1.25 MG/ML IV SOLN
0.6250 mg | Freq: Four times a day (QID) | INTRAVENOUS | Status: DC | PRN
  Filled 2014-12-08: qty 0.5

## 2014-12-08 MED ORDER — ROCURONIUM BROMIDE 100 MG/10ML IV SOLN
INTRAVENOUS | Status: DC | PRN
  Administered 2014-12-08: 50 mg via INTRAVENOUS
  Administered 2014-12-08 (×2): 10 mg via INTRAVENOUS

## 2014-12-08 MED ORDER — 0.9 % SODIUM CHLORIDE (POUR BTL) OPTIME
TOPICAL | Status: DC | PRN
  Administered 2014-12-08: 1000 mL

## 2014-12-08 MED ORDER — ACETAMINOPHEN 160 MG/5ML PO SOLN
650.0000 mg | ORAL | Status: DC | PRN
  Administered 2014-12-09: 650 mg via ORAL
  Filled 2014-12-08: qty 20.3

## 2014-12-08 MED ORDER — HYDROMORPHONE HCL 1 MG/ML IJ SOLN
0.2500 mg | INTRAMUSCULAR | Status: DC | PRN
  Administered 2014-12-08: 0.5 mg via INTRAVENOUS

## 2014-12-08 MED ORDER — TISSEEL VH 10 ML EX KIT
PACK | CUTANEOUS | Status: AC
  Filled 2014-12-08: qty 2

## 2014-12-08 MED ORDER — ONDANSETRON HCL 4 MG/2ML IJ SOLN
INTRAMUSCULAR | Status: AC
  Filled 2014-12-08: qty 2

## 2014-12-08 MED ORDER — LACTATED RINGERS IR SOLN
Status: DC | PRN
  Administered 2014-12-08: 1000 mL

## 2014-12-08 MED ORDER — LIDOCAINE HCL (CARDIAC) 20 MG/ML IV SOLN
INTRAVENOUS | Status: AC
  Filled 2014-12-08: qty 5

## 2014-12-08 MED ORDER — OXYCODONE HCL 5 MG/5ML PO SOLN: 5.0000 mg | ORAL | Status: DC | PRN

## 2014-12-08 MED ORDER — DEXAMETHASONE SODIUM PHOSPHATE 10 MG/ML IJ SOLN
INTRAMUSCULAR | Status: AC
  Filled 2014-12-08: qty 1

## 2014-12-08 MED ORDER — UNJURY CHICKEN SOUP POWDER: 2.0000 [oz_av] | Freq: Four times a day (QID) | ORAL | Status: DC

## 2014-12-08 MED ORDER — FLUTICASONE PROPIONATE HFA 44 MCG/ACT IN AERO
2.0000 | INHALATION_SPRAY | Freq: Two times a day (BID) | RESPIRATORY_TRACT | Status: DC | PRN
  Filled 2014-12-08: qty 10.6

## 2014-12-08 MED ORDER — ACETAMINOPHEN 160 MG/5ML PO SOLN: 325.0000 mg | ORAL | Status: DC | PRN

## 2014-12-08 MED ORDER — DEXTROSE 5 % IV SOLN
2.0000 g | INTRAVENOUS | Status: AC
  Administered 2014-12-08 (×2): 2 g via INTRAVENOUS

## 2014-12-08 MED ORDER — PROPOFOL 10 MG/ML IV BOLUS
INTRAVENOUS | Status: DC | PRN
  Administered 2014-12-08: 200 mg via INTRAVENOUS

## 2014-12-08 MED ORDER — DEXAMETHASONE SODIUM PHOSPHATE 4 MG/ML IJ SOLN
INTRAMUSCULAR | Status: DC | PRN
  Administered 2014-12-08: 10 mg via INTRAVENOUS

## 2014-12-08 SURGICAL SUPPLY — 71 items
APPLICATOR COTTON TIP 6IN STRL (MISCELLANEOUS) ×6 IMPLANT
APPLIER CLIP ROT 13.4 12 LRG (CLIP)
BLADE SURG SZ11 CARB STEEL (BLADE) ×3 IMPLANT
CABLE HIGH FREQUENCY MONO STRZ (ELECTRODE) ×3 IMPLANT
CHLORAPREP W/TINT 26ML (MISCELLANEOUS) ×3 IMPLANT
CLIP APPLIE ROT 13.4 12 LRG (CLIP) IMPLANT
CLIP SUT LAPRA TY ABSORB (SUTURE) ×12 IMPLANT
CUTTER FLEX LINEAR 45M (STAPLE) ×3 IMPLANT
DECANTER SPIKE VIAL GLASS SM (MISCELLANEOUS) ×3 IMPLANT
DEVICE SUTURE ENDOST 10MM (ENDOMECHANICALS) IMPLANT
DEVICE TROCAR PUNCTURE CLOSURE (ENDOMECHANICALS) ×3 IMPLANT
DISSECTOR BLUNT TIP ENDO 5MM (MISCELLANEOUS) IMPLANT
DRAIN PENROSE 18X1/4 LTX STRL (WOUND CARE) ×3 IMPLANT
DRAPE CAMERA CLOSED 9X96 (DRAPES) ×3 IMPLANT
ELECT REM PT RETURN 9FT ADLT (ELECTROSURGICAL) ×3
ELECTRODE REM PT RTRN 9FT ADLT (ELECTROSURGICAL) ×1 IMPLANT
GAUZE SPONGE 4X4 12PLY STRL (GAUZE/BANDAGES/DRESSINGS) ×3 IMPLANT
GAUZE SPONGE 4X4 16PLY XRAY LF (GAUZE/BANDAGES/DRESSINGS) ×3 IMPLANT
GLOVE BIOGEL PI IND STRL 7.5 (GLOVE) ×1 IMPLANT
GLOVE BIOGEL PI INDICATOR 7.5 (GLOVE) ×2
GLOVE ECLIPSE 7.5 STRL STRAW (GLOVE) ×3 IMPLANT
GOWN STRL REUS W/TWL XL LVL3 (GOWN DISPOSABLE) ×6 IMPLANT
HEMOSTAT SURGICEL 4X8 (HEMOSTASIS) IMPLANT
HOVERMATT SINGLE USE (MISCELLANEOUS) ×3 IMPLANT
KIT BASIN OR (CUSTOM PROCEDURE TRAY) ×3 IMPLANT
KIT GASTRIC LAVAGE 34FR ADT (SET/KITS/TRAYS/PACK) ×3 IMPLANT
LIQUID BAND (GAUZE/BANDAGES/DRESSINGS) ×3 IMPLANT
NEEDLE SPNL 22GX3.5 QUINCKE BK (NEEDLE) ×3 IMPLANT
PACK CARDIOVASCULAR III (CUSTOM PROCEDURE TRAY) ×3 IMPLANT
PEN SKIN MARKING BROAD (MISCELLANEOUS) ×3 IMPLANT
POUCH SPECIMEN RETRIEVAL 10MM (ENDOMECHANICALS) IMPLANT
RELOAD 45 VASCULAR/THIN (ENDOMECHANICALS) ×3 IMPLANT
RELOAD ENDO STITCH 2.0 (ENDOMECHANICALS)
RELOAD STAPLE TA45 3.5 REG BLU (ENDOMECHANICALS) ×6 IMPLANT
RELOAD STAPLER BLUE 60MM (STAPLE) ×2 IMPLANT
RELOAD STAPLER GOLD 60MM (STAPLE) ×1 IMPLANT
RELOAD STAPLER WHITE 60MM (STAPLE) ×1 IMPLANT
SCISSORS LAP 5X45 EPIX DISP (ENDOMECHANICALS) ×3 IMPLANT
SEALANT SURGICAL APPL DUAL CAN (MISCELLANEOUS) ×6 IMPLANT
SET IRRIG TUBING LAPAROSCOPIC (IRRIGATION / IRRIGATOR) ×3 IMPLANT
SHEARS CURVED HARMONIC AC 45CM (MISCELLANEOUS) ×3 IMPLANT
SLEEVE ADV FIXATION 12X100MM (TROCAR) ×6 IMPLANT
SOLUTION ANTI FOG 6CC (MISCELLANEOUS) ×3 IMPLANT
STAPLE ECHEON FLEX 60 POW ENDO (STAPLE) IMPLANT
STAPLER ECHELON LONG 60 440 (INSTRUMENTS) IMPLANT
STAPLER RELOAD BLUE 60MM (STAPLE) ×6
STAPLER RELOAD GOLD 60MM (STAPLE) ×3
STAPLER RELOAD WHITE 60MM (STAPLE) ×3
STAPLER VISISTAT 35W (STAPLE) ×3 IMPLANT
SUT DEVICE BRAIDED 0X39 (SUTURE) IMPLANT
SUT DVC SILK 2.0X39 (SUTURE) ×15 IMPLANT
SUT DVC VICRYL PGA 2.0X39 (SUTURE) ×18 IMPLANT
SUT MNCRL AB 4-0 PS2 18 (SUTURE) ×3 IMPLANT
SUT RELOAD ENDO STITCH 2 48X1 (ENDOMECHANICALS)
SUT RELOAD ENDO STITCH 2.0 (ENDOMECHANICALS)
SUT SURG 0 T 19/GS 22 1969 62 (SUTURE) ×3 IMPLANT
SUT VIC AB 2-0 SH 27 (SUTURE)
SUT VIC AB 2-0 SH 27X BRD (SUTURE) IMPLANT
SUTURE RELOAD END STTCH 2 48X1 (ENDOMECHANICALS) IMPLANT
SUTURE RELOAD ENDO STITCH 2.0 (ENDOMECHANICALS) IMPLANT
SYR 20CC LL (SYRINGE) ×6 IMPLANT
SYR 50ML LL SCALE MARK (SYRINGE) ×3 IMPLANT
TOWEL OR 17X26 10 PK STRL BLUE (TOWEL DISPOSABLE) ×3 IMPLANT
TOWEL OR NON WOVEN STRL DISP B (DISPOSABLE) ×3 IMPLANT
TRAY FOLEY W/METER SILVER 14FR (SET/KITS/TRAYS/PACK) ×3 IMPLANT
TROCAR ADV FIXATION 12X100MM (TROCAR) ×3 IMPLANT
TROCAR ADV FIXATION 5X100MM (TROCAR) ×3 IMPLANT
TROCAR BLADELESS OPT 5 100 (ENDOMECHANICALS) ×3 IMPLANT
TROCAR XCEL 12X100 BLDLESS (ENDOMECHANICALS) ×3 IMPLANT
TUBING ENDO SMARTCAP PENTAX (MISCELLANEOUS) ×3 IMPLANT
TUBING FILTER THERMOFLATOR (ELECTROSURGICAL) ×3 IMPLANT

## 2014-12-08 NOTE — Interval H&P Note (Signed)
History and Physical Interval Note:  12/08/2014 11:08 AM  Elizabeth Hawkins  has presented today for surgery, with the diagnosis of morbid obesity  The various methods of treatment have been discussed with the patient and family. After consideration of risks, benefits and other options for treatment, the patient has consented to  Procedure(s): LAPAROSCOPIC ROUX-EN-Y GASTRIC BYPASS (N/A) as a surgical intervention .  The patient's history has been reviewed, patient examined, no change in status, stable for surgery.  I have reviewed the patient's chart and labs.  Questions were answered to the patient's satisfaction.     Kveon Casanas T

## 2014-12-08 NOTE — Op Note (Signed)
Preop diagnosis: Morbid obesity  Postop diagnosis: Morbid obesity  Body mass index is 42.46 kg/(m^2).  Surgical procedure: Laparoscopic Roux-en-Y gastric bypass and repair of incisional hernia at the umbilicus  Surgeon: Marland Kitchen T.Malek Skog M.D.  Asst.: Alphonsa Overall M.D.  Anesthesia: General  Complications:  None  EBL: Minimal  Drains: None  Disposition: PACU in good condition  Description of procedure: Patient is brought to the operating room and general anesthesia induced. She had received preoperative broad-spectrum IV antibiotics and subcutaneous heparin. The abdomen was widely sterilely prepped and draped. Patient timeout was performed and correct patient and procedure confirmed. Access was obtained with a 12 mm Optiview trocar in the left upper quadrant and pneumoperitoneum established without difficulty. Under direct vision 12 mm trocars were placed laterally in the right upper quadrant, right upper quadrant midclavicular line, and to the left and above the umbilicus for the camera port. A 5 mm trocar was placed laterally in the left upper quadrant. Omental adhesions from an approximately 2 cm hernia at a previous laparoscopic port site at the umbilicus were taken down. The omentum was brought into the upper abdomen and the transverse mesocolon elevated and the ligament of Treitz clearly identified. A 40 cm biliopancreatic limb was then carefully measured from the ligament of Treitz. The small intestine was divided at this point with a single firing of the Breisch load linear stapler. A Penrose drain was sutured to the end of the Roux-en-Y limb for later identification. A 100 cm Roux-en-Y limb was then carefully measured. At this point a side-to-side anastomosis was created between the Roux limb and the end of the biliopancreatic limb. This was accomplished with a single firing of the 45 mm Lamboy load linear stapler. The common enterotomy was closed with a running 2-0 Vicryl begun at either  end of the enterotomy and tied centrally. The mesenteric defect was then closed with running 2-0 silk. The omentum was then divided with the harmonic scalpel up towards the transverse colon to allow mobility of the Roux limb toward the gastric pouch. The patient was then placed in steep reversed Trendelenburg. Through a 5 mm subxiphoid site the [facility] retractor was placed and the left lobe of the liver elevated with excellent exposure of the upper stomach and hiatus. The angle of Hiss was then mobilized with the harmonic scalpel. A 4 cm gastric pouch was then carefully measured along the lesser curve of the stomach. Dissection was carried along the lesser curve at this point with the Harmonic scalpel working carefully back toward the lesser sac at right angles to the lesser curve. The free lesser sac was then entered. After being sure all tubes were removed from the stomach an initial firing of the gold load 60 mm linear stapler was fired at right angles across the lesser curve for about 4 cm. The gastric pouch was further mobilized posteriorly and then the pouch was completed with 2 further firings of the 60 mm blue load linear stapler up through the previously dissected angle of His. It was ensured that the pouch was completely mobilized away from the gastric remnant. This created a nice tubular 4-5 cm gastric pouch. The staple line of the gastric remnant was then oversewn with 2-0 silk for hemostasis. The Roux limb was then brought up in an antecolic fashion with the candycane facing to the patient's left without undue tension. The gastrojejunostomy was created with an initial posterior row of 2-0 Vicryl between the Roux limb and the staple line of the gastric  pouch. Enterotomies were then made in the gastric pouch and the Roux limb with the harmonic scalpel and at approximately 2-2-1/2 cm anastomosis was created with a single firing of the blue load linear stapler. The staple line was inspected and was intact  without bleeding. The common enterotomy was then closed with running 2-0 Vicryl begun at either end and tied centrally. The wall tube was then easily passed through the anastomosis and an outer anterior layer of running 2-0 Vicryl was placed. The Ewald tube was removed. With the outlet of the gastrojejunostomy clamped and under saline irrigation the assistant performed upper endoscopy and with the gastric pouch tensely distended with air there was no evidence of leak. The pouch was desufflated. The Terance Hart defect was closed with running 2-0 silk. The abdomen was inspected for any evidence of bleeding or bowel injury and everything looked fine. The hernis defect at the umbilicus was closed with several interrupted 0 Surgilon sutures with an Endoclose. The Nathanson retractor was removed under direct vision after coating the anastomosis with Tisseel tissue sealant. All CO2 was evacuated and trochars removed. Skin incisions were closed with staples. Sponge needle and instrument counts were correct. The patient was taken to the PACU in good condition.     Edward Jolly MD, FACS  12/08/2014, 2:44 PM

## 2014-12-08 NOTE — Anesthesia Preprocedure Evaluation (Signed)
Anesthesia Evaluation  Patient identified by MRN, date of birth, ID band Patient awake    Reviewed: Allergy & Precautions, NPO status , Patient's Chart, lab work & pertinent test results  Airway Mallampati: II  TM Distance: >3 FB Neck ROM: Full    Dental no notable dental hx.    Pulmonary neg pulmonary ROS, asthma , sleep apnea , former smoker,  breath sounds clear to auscultation  Pulmonary exam normal       Cardiovascular hypertension, Pt. on medications negative cardio ROS  Rhythm:Regular Rate:Normal  Cardiology office visit 07-14-14 Dr. Harrington Challenger reviewed.   Neuro/Psych PSYCHIATRIC DISORDERS Anxiety Depression negative neurological ROS  negative psych ROS   GI/Hepatic negative GI ROS, Neg liver ROS, GERD-  Medicated,  Endo/Other  negative endocrine ROSdiabetes, Type 2, Oral Hypoglycemic AgentsMorbid obesity  Renal/GU negative Renal ROS  negative genitourinary   Musculoskeletal negative musculoskeletal ROS (+) Arthritis -,   Abdominal (+) + obese,   Peds negative pediatric ROS (+)  Hematology negative hematology ROS (+) anemia ,   Anesthesia Other Findings   Reproductive/Obstetrics negative OB ROS                             Anesthesia Physical Anesthesia Plan  ASA: III  Anesthesia Plan: General   Post-op Pain Management:    Induction: Intravenous  Airway Management Planned: Oral ETT  Additional Equipment:   Intra-op Plan:   Post-operative Plan: Extubation in OR  Informed Consent: I have reviewed the patients History and Physical, chart, labs and discussed the procedure including the risks, benefits and alternatives for the proposed anesthesia with the patient or authorized representative who has indicated his/her understanding and acceptance.   Dental advisory given  Plan Discussed with: CRNA  Anesthesia Plan Comments:         Anesthesia Quick Evaluation

## 2014-12-08 NOTE — Op Note (Signed)
Name:  Elizabeth Hawkins MRN: [id number] Date of Surgery: 12/08/2014  Preop Diagnosis:  Morbid Obesity, S/P RYGB  Postop Diagnosis:  Morbid Obesity (Weight - 254, BMI - 43.7), S/P RYGB  Procedure:  Upper endoscopy  (Intraoperative)  Surgeon:  Alphonsa Overall, M.D.  Anesthesia:  GET  Indications for procedure: Elizabeth Hawkins is a 59 y.o. female whose primary care physician is Cathlean Cower, MD and has completed a Roux-en-Y gastric bypass today by Dr. Excell Seltzer.  I am doing an intraoperative upper endoscopy to evaluate the gastric pouch and the gastro-jejunal anastomosis.  Operative Note: The patient is under general anesthesia.  Dr. Excell Seltzer is laparoscoping the patient while I do an upper endoscopy to evaluate the stomach pouch and gastrojejunal anastomosis.  With the patient intubated, I passed the Pentax endoscope without difficulty down the esophagus.  The esophago-gastric junction was at 41 cm.  The gastro-jejunal anastomosis was at 46 cm.  The mucosa of the stomach looked viable and the staple line was intact without bleeding.  The gastro-jejunal anastomosis looked okay.  While I insufflated the stomach pouch with air, Dr. Excell Seltzer clamped off the efferent limb of the jejunum.  He then flooded the upper abdomen with saline to put the gastric pouch and gastro-jejunal anastomosis under saline.  There was no bubbling or evidence of a leak.    The scope was then withdrawn.  The esophagus was unremarkable and the patient tolerated the endoscopy without difficulty.  Alphonsa Overall, MD, [facility] Pager: [phone] Office phone:  [phone]

## 2014-12-08 NOTE — Anesthesia Procedure Notes (Signed)
Procedure Name: Intubation Date/Time: 12/08/2014 12:04 PM Performed by: Pilar Grammes Pre-anesthesia Checklist: Patient identified, Emergency Drugs available, Suction available, Patient being monitored and Timeout performed Patient Re-evaluated:Patient Re-evaluated prior to inductionOxygen Delivery Method: Circle system utilized Preoxygenation: Pre-oxygenation with 100% oxygen Intubation Type: IV induction Ventilation: Mask ventilation without difficulty Laryngoscope Size: 3 and Mac Grade View: Grade II Tube type: Oral Tube size: 7.5 mm Number of attempts: 1 Airway Equipment and Method: Stylet Placement Confirmation: positive ETCO2,  ETT inserted through vocal cords under direct vision,  CO2 detector and breath sounds checked- equal and bilateral Secured at: 22 cm Tube secured with: Tape Dental Injury: Teeth and Oropharynx as per pre-operative assessment

## 2014-12-08 NOTE — Progress Notes (Signed)
Utilization review completed.  

## 2014-12-08 NOTE — Transfer of Care (Signed)
Immediate Anesthesia Transfer of Care Note  Patient: Elizabeth Hawkins  Procedure(s) Performed: Procedure(s): LAPAROSCOPIC ROUX-EN-Y GASTRIC BYPASS LAPRASCOPIC VENTRAL HERNIA REPAIR  (N/A)  Patient Location: PACU  Anesthesia Type:General  Level of Consciousness: awake, alert , oriented and patient cooperative  Airway & Oxygen Therapy: Patient Spontanous Breathing and Patient connected to face mask oxygen  Post-op Assessment: Report given to RN, Post -op Vital signs reviewed and stable and Patient moving all extremities X 4  Post vital signs: stable  Last Vitals:  Filed Vitals:   12/08/14 1500  BP: 138/92  Pulse: 93  Temp: 37.3 C  Resp: 15    Complications: No apparent anesthesia complications

## 2014-12-08 NOTE — H&P (Signed)
History of Present Illness Elizabeth Hawkins T. Elizabeth Hawkins Roseland MD; 12/03/2014 12:00 PM) Patient words: Pre op Gastric By-Pass.  The patient is a 59 year old female who presents with obesity. She returns for her preoperative visit prior to planned laparoscopic Roux-en-Y gastric bypass. The patient gives a history of progressive obesity since soon after the birth of her child about 15 years ago despite multiple attempts at medical management. She has been able to lose about 10 pounds at a time but then experiences weight regain plus additional weight. Obesity has been affecting the patient in a number of ways including interfering with activities of daily living.. Significant co-morbid illnesses have developed including diabetes mellitus and hypertension, elevated cholesterol and GERD. She is very concerned about the decline in her health and risk to her help longevity going forward at her current weight.. She has successfully completed her preoperative workup. No concerns on cycle nutrition evaluation. Lab work, ultrasound, chest x-ray and EKG were unremarkable. She has started her preoperative diet.   Problem List/Past Medical Elizabeth Jolly, MD; 12/03/2014 12:00 PM) MORBID OBESITY WITH BMI OF 40.0-44.9, ADULT (278.01  E66.01)  Other Problems Elizabeth Jolly, MD; 12/03/2014 12:00 PM) High blood pressure Gastroesophageal Reflux Disease Sleep Apnea Hypercholesterolemia Arthritis Back Pain Diabetes Mellitus  Past Surgical History Elizabeth Jolly, MD; 12/03/2014 12:00 PM) Knee Surgery Bilateral. Hysterectomy (not due to cancer) - Complete  Diagnostic Studies History Elizabeth Jolly, MD; 12/03/2014 12:00 PM) Colonoscopy 1-5 years ago Mammogram within last year Pap Smear 1-5 years ago  Allergies Elizabeth Hawkins, [place]; 12/03/2014 11:25 AM) No Known Drug Allergies12/11/2013  Medication History Elizabeth Jolly, MD; 12/03/2014 12:00 PM) Atorvastatin Calcium (10MG   Tablet, Oral) Active. Levothyroxine Sodium (50MCG Tablet, Oral) Active. Lisinopril (40MG  Tablet, Oral) Active. MetFORMIN HCl ER (500MG  Tablet ER 24HR, Oral) Active. Maxzide-25 (37.5-25MG  Tablet, Oral) Active. Qvar (40MCG/ACT Aerosol Soln, Inhalation) Active. Terconazole (0.4% Cream, Vaginal) Active. Pantoprazole Sodium (40MG  Tablet DR, Oral) Active. Xanax (0.25MG  Tablet, Oral) Active. OxyCODONE HCl (5MG /5ML Solution, 5-10 Milliliter Oral every four hours, as needed, Taken starting 12/03/2014) Active. OneTouch Ultra Blue (In Vitro) Active. Zoloft (50MG  Tablet, Oral) Active. Protonix (40MG  Packet, Oral) Active.  Social History Elizabeth Jolly, MD; 12/03/2014 12:00 PM) Caffeine use Tea. Alcohol use Occasional alcohol use. Tobacco use Former smoker. No drug use  Family History Elizabeth Jolly, MD; 12/03/2014 12:00 PM) Arthritis Mother. Cancer Brother. Diabetes Mellitus Daughter. Respiratory Condition Brother. Heart Disease Family Members In General, Mother. Heart disease in female family member before age 67 Hypertension Father, Mother.  Pregnancy / Birth History Elizabeth Jolly, MD; 12/03/2014 12:00 PM) Para 1 Maternal age 73-30 Age of menopause 75-50 Age at menarche 21 years. Irregular periods Gravida 2  Vitals Elizabeth Hawkins; 12/03/2014 11:25 AM) 12/03/2014 11:23 AM Weight: 254.8 lb Height: 64in Body Surface Area: 2.28 m Body Mass Index: 43.74 kg/m Temp.: 98.27F(Temporal)  Pulse: 72 (Regular)  Resp.: 16 (Unlabored)  BP: 138/84 (Sitting, Left Arm, Standard) Pre Op Weight: 254.8 Pre Op BMI: 43.74   Physical Exam Elizabeth Hawkins T. Elizabeth Nesbit MD; 12/03/2014 12:01 PM) The physical exam findings are as follows: Note:General: Alert, morbidly obese African-American female, in no distress Skin: Warm and dry without rash or infection. HEENT: No palpable masses or thyromegaly. Sclera nonicteric. Pupils equal round and  reactive. Lymph nodes: No cervical, supraclavicular, or inguinal nodes palpable. Lungs: Breath sounds clear and equal. No wheezing or increased work of breathing. Cardiovascular: Regular rate and rhythm without murmer. No JVD or edema. Peripheral pulses  intact. No carotid bruits. Abdomen: Nondistended. Soft and nontender. No masses palpable. No organomegaly. No palpable hernias. Extremities: No edema or joint swelling or deformity. No chronic venous stasis changes. Neurologic: Alert and fully oriented. Gait normal. No focal weakness. Psychiatric: Normal mood and affect. Thought content appropriate with normal judgement and insight    Assessment & Plan Elizabeth Hawkins T. Elizabeth Arizmendi MD; 12/03/2014 12:01 PM) MORBID OBESITY WITH BMI OF 40.0-44.9, ADULT (278.01  E66.01) Impression: Patient with progressive morbid obesity unresponsive to multiple efforts at medical management who presents with a BMI of 44.7 and comorbidities of diabetes mellitus oral agent controlled, hypertension, dyslipidemia, and GERD. I believe there would be very significant medical benefit from surgical weight loss. We reviewed the procedure again and the consent form and all her questions were answered. Ready to proceed with laparoscopic Roux-en-Y gastric bypass. She is given prescriptions for pain medication and bowel prep.

## 2014-12-09 ENCOUNTER — Encounter (HOSPITAL_COMMUNITY): Payer: Self-pay | Admitting: General Surgery

## 2014-12-09 ENCOUNTER — Inpatient Hospital Stay (HOSPITAL_COMMUNITY): Payer: Medicare Other

## 2014-12-09 LAB — GLUCOSE, CAPILLARY
Glucose-Capillary: 100 mg/dL — ABNORMAL HIGH (ref 70–99)
Glucose-Capillary: 103 mg/dL — ABNORMAL HIGH (ref 70–99)
Glucose-Capillary: 105 mg/dL — ABNORMAL HIGH (ref 70–99)
Glucose-Capillary: 136 mg/dL — ABNORMAL HIGH (ref 70–99)
Glucose-Capillary: 93 mg/dL (ref 70–99)
Glucose-Capillary: 95 mg/dL (ref 70–99)

## 2014-12-09 LAB — CBC WITH DIFFERENTIAL/PLATELET
Basophils Absolute: 0 10*3/uL (ref 0.0–0.1)
Basophils Relative: 0 % (ref 0–1)
EOS PCT: 0 % (ref 0–5)
Eosinophils Absolute: 0 10*3/uL (ref 0.0–0.7)
HCT: 30 % — ABNORMAL LOW (ref 36.0–46.0)
Hemoglobin: 9.1 g/dL — ABNORMAL LOW (ref 12.0–15.0)
LYMPHS ABS: 2 10*3/uL (ref 0.7–4.0)
Lymphocytes Relative: 16 % (ref 12–46)
MCH: 22.9 pg — ABNORMAL LOW (ref 26.0–34.0)
MCHC: 30.3 g/dL (ref 30.0–36.0)
MCV: 75.4 fL — AB (ref 78.0–100.0)
MONO ABS: 0.7 10*3/uL (ref 0.1–1.0)
Monocytes Relative: 6 % (ref 3–12)
Neutro Abs: 9.5 10*3/uL — ABNORMAL HIGH (ref 1.7–7.7)
Neutrophils Relative %: 78 % — ABNORMAL HIGH (ref 43–77)
PLATELETS: 228 10*3/uL (ref 150–400)
RBC: 3.98 MIL/uL (ref 3.87–5.11)
RDW: 14.1 % (ref 11.5–15.5)
WBC: 12.2 10*3/uL — ABNORMAL HIGH (ref 4.0–10.5)

## 2014-12-09 LAB — HEMOGLOBIN AND HEMATOCRIT, BLOOD
HCT: 31.4 % — ABNORMAL LOW (ref 36.0–46.0)
HEMOGLOBIN: 9.2 g/dL — AB (ref 12.0–15.0)

## 2014-12-09 MED ORDER — IOHEXOL 300 MG/ML  SOLN
50.0000 mL | Freq: Once | INTRAMUSCULAR | Status: AC | PRN
  Administered 2014-12-09: 20 mL via ORAL

## 2014-12-09 NOTE — Plan of Care (Signed)
Problem: Food- and Nutrition-Related Knowledge Deficit (NB-1.1) Goal: Nutrition education Formal process to instruct or train a patient/client in a skill or to impart knowledge to help patients/clients voluntarily manage or modify food choices and eating behavior to maintain or improve health. Outcome: Completed/Met Date Met:  12/09/14 Nutrition Education Note  Received consult for diet education per DROP protocol.   Discussed 2 week post op diet with pt. Emphasized that liquids must be non carbonated, non caffeinated, and sugar free. Fluid goals discussed. Pt to follow up with outpatient bariatric RD for further diet progression after 2 weeks. Multivitamins and minerals also reviewed. Teach back method used, pt expressed understanding, expect good compliance.   Diet: First 2 Weeks  You will see the nutritionist about two (2) weeks after your surgery. The nutritionist will increase the types of foods you can eat if you are handling liquids well:  If you have severe vomiting or nausea and cannot handle clear liquids lasting longer than 1 day, call your surgeon  Protein Shake  Drink at least 2 ounces of shake 5-6 times per day  Each serving of protein shakes (usually 8 - 12 ounces) should have a minimum of:  15 grams of protein  And no more than 5 grams of carbohydrate  Goal for protein each day:  Men = 80 grams per day  Women = 60 grams per day  Protein powder may be added to fluids such as non-fat milk or Lactaid milk or Soy milk (limit to 35 grams added protein powder per serving)   Hydration  Slowly increase the amount of water and other clear liquids as tolerated (See Acceptable Fluids)  Slowly increase the amount of protein shake as tolerated  Sip fluids slowly and throughout the day  May use sugar substitutes in small amounts (no more than 6 - 8 packets per day; i.e. Splenda)   Fluid Goal  The first goal is to drink at least 8 ounces of protein shake/drink per day (or as directed  by the nutritionist); some examples of protein shakes are Syntrax Nectar, Adkins Advantage, EAS Edge HP, and Unjury. See handout from pre-op Bariatric Education Class:  Slowly increase the amount of protein shake you drink as tolerated  You may find it easier to slowly sip shakes throughout the day  It is important to get your proteins in first  Your fluid goal is to drink 64 - 100 ounces of fluid daily  It may take a few weeks to build up to this  32 oz (or more) should be clear liquids  And  32 oz (or more) should be full liquids (see below for examples)  Liquids should not contain sugar, caffeine, or carbonation   Clear Liquids:  Water or Sugar-free flavored water (i.e. Fruit H2O, Propel)  Decaffeinated coffee or tea (sugar-free)  Crystal Lite, Wyler's Lite, Minute Maid Lite  Sugar-free Jell-O  Bouillon or broth  Sugar-free Popsicle: *Less than 20 calories each; Limit 1 per day   Full Liquids:  Protein Shakes/Drinks + 2 choices per day of other full liquids  Full liquids must be:  No More Than 12 grams of Carbs per serving  No More Than 3 grams of Fat per serving  Strained low-fat cream soup  Non-Fat milk  Fat-free Lactaid Milk  Sugar-free yogurt (Dannon Lite & Fit, Greek yogurt)     Brason Berthelot, MS, RD, LDN Pager: [phone] After Hours Pager: [phone]        

## 2014-12-09 NOTE — Anesthesia Postprocedure Evaluation (Signed)
  Anesthesia Post-op Note  Patient: Elizabeth Hawkins  Procedure(s) Performed: Procedure(s) (LRB): LAPAROSCOPIC ROUX-EN-Y GASTRIC BYPASS LAPRASCOPIC VENTRAL HERNIA REPAIR  (N/A)  Patient Location: PACU  Anesthesia Type: General  Level of Consciousness: awake and alert   Airway and Oxygen Therapy: Patient Spontanous Breathing  Post-op Pain: mild  Post-op Assessment: Post-op Vital signs reviewed, Patient's Cardiovascular Status Stable, Respiratory Function Stable, Patent Airway and No signs of Nausea or vomiting  Last Vitals:  Filed Vitals:   12/09/14 0506  BP: 142/74  Pulse: 85  Temp: 36.9 C  Resp:     Post-op Vital Signs: stable   Complications: No apparent anesthesia complications

## 2014-12-09 NOTE — Progress Notes (Signed)
Patient ID: Elizabeth Hawkins, female   DOB: 01-Feb-1956, 59 y.o.   MRN: [id number] 1 Day Post-Op  Subjective: No major complaints. So but no severe pain. No nausea. She does have a headache which she feels may be from the morphine.  Objective: Vital signs in last 24 hours: Temp:  [98 F (36.7 C)-99.6 F (37.6 C)] 98.5 F (36.9 C) (05/03 0954) Pulse Rate:  [81-93] 81 (05/03 0954) Resp:  [14-19] 15 (05/03 0954) BP: (129-153)/(67-100) 137/80 mmHg (05/03 0954) SpO2:  [97 %-100 %] 99 % (05/03 0954) Weight:  [113.91 kg (251 lb 2 oz)] 113.91 kg (251 lb 2 oz) (05/02 1615) Last BM Date: 12/08/14  Intake/Output from previous day: 05/02 0701 - 05/03 0700 In: 2643.3 [I.V.:2643.3] Out: 985 [Urine:985] Intake/Output this shift: Total I/O In: -  Out: 200 [Urine:200]  General appearance: alert, cooperative and no distress GI: normal findings: soft, non-tender Incision/Wound: clean and dry  Lab Results:   Recent Labs  12/08/14 2045 12/09/14 0414  WBC  --  12.2*  HGB 10.2* 9.1*  HCT 34.0* 30.0*  PLT  --  228   BMET No results for input(s): NA, K, CL, CO2, GLUCOSE, BUN, CREATININE, CALCIUM in the last 72 hours.  CBG (last 3)   Recent Labs  12/08/14 2334 12/09/14 0338 12/09/14 0754  GLUCAP 162* 136* 105*     Studies/Results: No results found.  Anti-infectives: Anti-infectives    Start     Dose/Rate Route Frequency Ordered Stop   12/08/14 0847  cefOXitin (MEFOXIN) 2 g in dextrose 5 % 50 mL IVPB     2 g 100 mL/hr over 30 Minutes Intravenous On call to O.R. 12/08/14 0847 12/08/14 1314      Assessment/Plan: s/p Procedure(s): LAPAROSCOPIC ROUX-EN-Y GASTRIC BYPASS LAPRASCOPIC VENTRAL HERNIA REPAIR  Stable postoperatively without apparent complication. For swallow study this morning.   LOS: 1 day    Abagayle Klutts T 12/09/2014

## 2014-12-09 NOTE — Progress Notes (Signed)
Patient alert and oriented, Post op day 1.  Provided support and encouragement.  Encouraged pulmonary toilet, ambulation and small sips of liquids when swallow study returned satisfactorily.  All questions answered.  Will continue to monitor. 

## 2014-12-10 LAB — CBC WITH DIFFERENTIAL/PLATELET
Basophils Absolute: 0 10*3/uL (ref 0.0–0.1)
Basophils Relative: 0 % (ref 0–1)
Eosinophils Absolute: 0.1 10*3/uL (ref 0.0–0.7)
Eosinophils Relative: 1 % (ref 0–5)
HEMATOCRIT: 28.9 % — AB (ref 36.0–46.0)
HEMOGLOBIN: 8.8 g/dL — AB (ref 12.0–15.0)
LYMPHS ABS: 2.4 10*3/uL (ref 0.7–4.0)
Lymphocytes Relative: 29 % (ref 12–46)
MCH: 23.2 pg — AB (ref 26.0–34.0)
MCHC: 30.4 g/dL (ref 30.0–36.0)
MCV: 76.3 fL — ABNORMAL LOW (ref 78.0–100.0)
MONO ABS: 0.7 10*3/uL (ref 0.1–1.0)
MONOS PCT: 9 % (ref 3–12)
NEUTROS ABS: 5.2 10*3/uL (ref 1.7–7.7)
Neutrophils Relative %: 61 % (ref 43–77)
Platelets: 200 10*3/uL (ref 150–400)
RBC: 3.79 MIL/uL — AB (ref 3.87–5.11)
RDW: 14.5 % (ref 11.5–15.5)
WBC: 8.5 10*3/uL (ref 4.0–10.5)

## 2014-12-10 LAB — GLUCOSE, CAPILLARY
GLUCOSE-CAPILLARY: 114 mg/dL — AB (ref 70–99)
Glucose-Capillary: 110 mg/dL — ABNORMAL HIGH (ref 70–99)

## 2014-12-10 NOTE — Progress Notes (Signed)
Patient alert and oriented, pain is controlled. Patient is tolerating fluids,  advanced to protein shake today, patient tolerated well. Reviewed Gastric Bypass discharge instructions with patient and patient is able to articulate understanding. Provided information on BELT program, Support Group and [facility] outpatient pharmacy. All questions answered, will continue to monitor.    

## 2014-12-10 NOTE — Discharge Instructions (Signed)
Per bariatric nurse    GASTRIC BYPASS/SLEEVE  Home Care Instructions   These instructions are to help you care for yourself when you go home.  Call: If you have any problems.  Call [phone] and ask for the surgeon on call  If you need immediate assistance come to the ER at [facility]. Tell the ER staff you are a new post-op gastric bypass or gastric sleeve patient  Signs and symptoms to report:  Severe  vomiting or nausea o If you cannot handle clear liquids for longer than 1 day, call your surgeon  Abdominal pain which does not get better after taking your pain medication  Fever greater than 100.4  F and chills  Heart rate over 100 beats a minute  Trouble breathing  Chest pain  Redness,  swelling, drainage, or foul odor at incision (surgical) sites  If your incisions open or pull apart  Swelling or pain in calf (lower leg)  Diarrhea (Loose bowel movements that happen often), frequent watery, uncontrolled bowel movements  Constipation, (no bowel movements for 3 days) if this happens: o Take Milk of Magnesia, 2 tablespoons by mouth, 3 times a day for 2 days if needed o Stop taking Milk of Magnesia once you have had a bowel movement o Call your doctor if constipation continues Or o Take Miralax  (instead of Milk of Magnesia) following the label instructions o Stop taking Miralax once you have had a bowel movement o Call your doctor if constipation continues  Anything you think is abnormal for you   Normal side effects after surgery:  Unable to sleep at night or unable to concentrate  Irritability  Being tearful (crying) or depressed  These are common complaints, possibly related to your anesthesia, stress of surgery, and change in lifestyle, that usually go away a few weeks after surgery. If these feelings continue, call your medical doctor.  Wound Care: You may have surgical glue, steri-strips, or staples over your incisions after surgery  Surgical  glue: Looks like clear film over your incisions and will wear off a little at a time  Steri-strips: Adhesive strips of tape over your incisions. You may notice a yellowish color on skin under the steri-strips. This is used to make the steri-strips stick better. Do not pull the steri-strips off - let them fall off  Staples: Staples may be removed before you leave the hospital o If you go home with staples, call [facility] for an appointment with your surgeons nurse to have staples removed 10 days after surgery, [phone]  Showering: You may shower two (2) days after your surgery unless your surgeon tells you differently o Wash gently around incisions with warm soapy water, rinse well, and gently pat dry o If you have a drain (tube from your incision), you may need someone to hold this while you shower o No tub baths until staples are removed and incisions are healed   Medications:  Medications should be liquid or crushed if larger than the size of a dime  Extended release pills (medication that releases a little bit at a time through the  day) should not be crushed  Depending on the size and number of medications you take, you may need to space (take a few throughout the day)/change the time you take your medications so that you do not over-fill your pouch (smaller stomach)  Make sure you follow-up with you primary care physician to make medication changes needed during rapid weight loss and  life -style changes  If you have diabetes, follow up with your doctor that orders your diabetes medication(s) within one week after surgery and check your blood sugar regularly   Do not drive while taking narcotics (pain medications)   Do not take acetaminophen (Tylenol) and Roxicet or Lortab Elixir at the same time since these pain medications contain acetaminophen   Diet:  First 2 Weeks You will see the nutritionist about two (2) weeks after your surgery. The nutritionist will  increase the types of foods you can eat if you are handling liquids well:  If you have severe vomiting or nausea and cannot handle clear liquids lasting longer than 1 day call your surgeon Protein Shake  Drink at least 2 ounces of shake 5-6 times per day  Each serving of protein shakes (usually 8-12 ounces) should have a minimum of: o 15 grams of protein o And no more than 5 grams of carbohydrate  Goal for protein each day: o Men = 80 grams per day o Women = 60 grams per day     Protein powder may be added to fluids such as non-fat milk or Lactaid milk or Soy milk (limit to 35 grams added protein powder per serving)  Hydration  Slowly increase the amount of water and other clear liquids as tolerated (See Acceptable Fluids)  Slowly increase the amount of protein shake as tolerated  Sip fluids slowly and throughout the day  May use sugar substitutes in small amounts (no more than 6-8 packets per day; i.e. Splenda)  Fluid Goal  The first goal is to drink at least 8 ounces of protein shake/drink per day (or as directed by the nutritionist); some examples of protein shakes are [employer], [employer], EAS Edge HP, and Unjury. - See handout from pre-op Bariatric Education Class: o Slowly increase the amount of protein shake you drink as tolerated o You may find it easier to slowly sip shakes throughout the day o It is important to get your proteins in first  Your fluid goal is to drink 64-100 ounces of fluid daily o It may take a few weeks to build up to this   32 oz. (or more) should be clear liquids And  32 oz. (or more) should be full liquids (see below for examples)  Liquids should not contain sugar, caffeine, or carbonation  Clear Liquids:  Water of Sugar-free flavored water (i.e. Fruit HO, Propel)  Decaffeinated coffee or tea (sugar-free)  Crystal lite, Wylers Lite, Minute Maid Lite  Sugar-free Jell-O  Bouillon or broth  Sugar-free Popsicle:    -  Less than 20 calories each; Limit 1 per day  Full Liquids:                   Protein Shakes/Drinks + 2 choices per day of other full liquids  Full liquids must be: o No More Than 12 grams of Carbs per serving o No More Than 3 grams of Fat per serving  Strained low-fat cream soup  Non-Fat milk  Fat-free Lactaid Milk  Sugar-free yogurt (Dannon Lite & Fit, Greek yogurt)    Vitamins and Minerals  Start 1 day after surgery unless otherwise directed by your surgeon  2 Chewable Multivitamin / Multimineral Supplement with iron (i.e. Centrum for Adults)  Vitamin B-12, 350-500 micrograms sub-lingual (place tablet under the tongue) each day  Chewable Calcium Citrate with Vitamin D-3 (Example: 3 Chewable Calcium  Plus 600 with Vitamin D-3) o Take 500 mg three (3)  times a day for a total of 1500 mg each day o Do not take all 3 doses of calcium at one time as it may cause constipation, and you can only absorb 500 mg at a time o Do not mix multivitamins containing iron with calcium supplements;  take 2 hours apart o Do not substitute Tums (calcium carbonate) for your calcium  Menstruating women and those at risk for anemia ( a blood disease that causes weakness) may need extra iron o Talk to your doctor to see if you need more iron  If you need extra iron: Total daily Iron recommendation (including Vitamins) is 50 to 100 mg Iron/day  Do not stop taking or change any vitamins or minerals until you talk to your nutritionist or surgeon  Your nutritionist and/or surgeon must approve all vitamin and mineral supplements   Activity and Exercise: It is important to continue walking at home. Limit your physical activity as instructed by your doctor. During this time, use these guidelines:  Do not lift anything greater than ten  (10) pounds for at least two (2) weeks  Do not go back to work or drive until Engineer, production says you can  You may have sex when you feel comfortable o It is VERY  important for female patients to use a reliable birth control method; fertility often increase after surgery o Do not get pregnant for at least 18 months  Start exercising as soon as your doctor tells you that you can o Make sure your doctor approves any physical activity  Start with a simple walking program  Walk 5-15 minutes each day, 7 days per week  Slowly increase until you are walking 30-45 minutes per day  Consider joining our [place] program. [phone] or email [email or web address]   Special Instructions Things to remember:  Free counseling is available for you and your family through collaboration between [facility] and [place]. Please call [phone] and leave a message  Use your CPAP when sleeping if this applies to you  Consider buying a medical alert bracelet that says you had lap-band surgery     You will likely have your first fill (fluid added to your band) 6 - 8 weeks after surgery  [facility] has a free Bariatric Surgery Support Group that meets monthly, the 3rd Thursday, [place]. You can see classes online at [email or web address]  It is very important to keep all follow up appointments with your surgeon, nutritionist, primary care physician, and behavioral health practitioner o After the first year, please follow up with your bariatric surgeon and nutritionist at least once a year in order to maintain best weight loss results                    [facility]:  [place]: [phone]               Bariatric Nurse Coordinator: [phone]  Gastric Bypass/Sleeve Home Care Instructions  Rev. 09/2012                                                         Reviewed and Vilinda Boehringer  by [facility] Patient Education Committee, Jan, 2014 ° ° ° ° ° ° ° ° ° °

## 2014-12-10 NOTE — Progress Notes (Signed)
Patient ID: Elizabeth Hawkins, female   DOB: [birth year], 59 y.o.   MRN: [id number] 2 Days Post-Op  Subjective: Feels pretty well. Minimal pain not requiring meds. Ambulatory. Tolerating protein shakes.  Objective: Vital signs in last 24 hours: Temp:  [98.5 F (36.9 C)-99 F (37.2 C)] 98.7 F (37.1 C) (05/04 0625) Pulse Rate:  [79-87] 87 (05/04 0625) Resp:  [15-16] 16 (05/04 0625) BP: (137-153)/(75-99) 142/99 mmHg (05/04 0625) SpO2:  [99 %] 99 % (05/04 0625) Last BM Date: 12/08/14  Intake/Output from previous day: 05/03 0701 - 05/04 0700 In: 1260 [P.O.:60; I.V.:1200] Out: 1200 [Urine:1200] Intake/Output this shift:    General appearance: alert, cooperative and no distress GI: appropriate tenderness around periumbilical hernia repair site Incision/Wound: clean and dry without evidence of infection  Lab Results:   Recent Labs  12/09/14 0414 12/09/14 1556 12/10/14 0415  WBC 12.2*  --  8.5  HGB 9.1* 9.2* 8.8*  HCT 30.0* 31.4* 28.9*  PLT 228  --  200   BMET No results for input(s): NA, K, CL, CO2, GLUCOSE, BUN, CREATININE, CALCIUM in the last 72 hours.   Studies/Results: Dg Ugi W/water Sol Cm  12/09/2014   CLINICAL DATA:  59 year old female status post laparoscopic Roux-en-Y gastric bypass and ventral hernia repair yesterday.  EXAM: WATER SOLUBLE UPPER GI SERIES  TECHNIQUE: Single-column upper GI series was performed using water soluble contrast.  CONTRAST:  31mL OMNIPAQUE IOHEXOL 300 MG/ML  SOLN  COMPARISON:  No priors.  FLUOROSCOPY TIME:  If the device does not provide the exposure index:  Fluoroscopy Time (in minutes and seconds):  1 minutes and 2 seconds.  Number of Acquired Images:  17  FINDINGS: Preprocedure KUB demonstrated a nonobstructive bowel gas pattern. Sutures were noted in the epigastric region.  Subsequently, following ingestion of Omnipaque the small gastric remnant was opacified, and contrast immediately extended into the small bowel. No extravasation of contrast  was noted. No signs of obstruction.  IMPRESSION: 1. Expected postoperative appearance following Roux-en-Y gastric bypass, without evidence of extravasation or obstruction, as above.   Electronically Signed   By: Vinnie Langton M.D.   On: 12/09/2014 11:29    Anti-infectives: Anti-infectives    Start     Dose/Rate Route Frequency Ordered Stop   12/08/14 0847  cefOXitin (MEFOXIN) 2 g in dextrose 5 % 50 mL IVPB     2 g 100 mL/hr over 30 Minutes Intravenous On call to O.R. 12/08/14 0847 12/08/14 1314      Assessment/Plan: s/p Procedure(s): LAPAROSCOPIC ROUX-EN-Y GASTRIC BYPASS LAPRASCOPIC VENTRAL HERNIA REPAIR  Doing well without complication. Okay for discharge today.   LOS: 2 days    Tremeka Helbling T 12/10/2014

## 2014-12-10 NOTE — Progress Notes (Signed)
Date:  Dec 09, 2014 Discharged to home no hhc need present at time of discharge  Elizabeth Hawkins  [phone]

## 2014-12-10 NOTE — Discharge Summary (Signed)
Patient ID: Elizabeth Hawkins [id number] 59 y.o. [birth year]  12/08/2014  Discharge date and time: 12/10/2014   Admitting Physician: Excell Seltzer T  Discharge Physician: Excell Seltzer T  Admission Diagnoses: morbid obesity  Discharge Diagnoses: same  Operations: Procedure(s): LAPAROSCOPIC ROUX-EN-Y GASTRIC BYPASS LAPRASCOPIC VENTRAL HERNIA REPAIR   Admission Condition: good  Discharged Condition: good  Indication for Admission: patient has a long history of morbid obesity unresponsive to medical management with multiple comorbidities including AODM, hypertension, sleep apnea and dyslipidemia. After extensive preoperative discussion and workup detailed elsewhere she is admitted electively for laparoscopic Roux-en-Y gastric bypass for treatment of her morbid obesity  Hospital Course: patient underwent an uneventful laparoscopic Roux-en-Y gastric bypass. She had an incisional hernia at her umbilicus that was closed as well. She tolerated the procedure well and her postoperative course was entirely unremarkable. Gastrografin swallow showed no leak or obstruction. She was started on water on the first postoperative day. She had minimal pain and no nausea. On the second postoperative day she is feeling quite well. Tolerating protein shakes. Having flatus. No significant abdominal pain. Vital signs are all within normal limits. Abdomen is benign and wounds healing well.   Disposition: Home  Patient Instructions:    Medication List    TAKE these medications        albuterol 108 (90 BASE) MCG/ACT inhaler  Commonly known as:  PROVENTIL HFA;VENTOLIN HFA  Inhale 2 puffs into the lungs every 6 (six) hours as needed for wheezing or shortness of breath.     ALPRAZolam 0.25 MG tablet  Commonly known as:  XANAX  Take 0.25 mg by mouth at bedtime as needed for anxiety or sleep.     atorvastatin 10 MG tablet  Commonly known as:  LIPITOR  Take 1 tablet (10 mg total) by mouth daily.     beclomethasone 40 MCG/ACT inhaler  Commonly known as:  QVAR  Inhale 2 puffs into the lungs 2 (two) times daily.     fluconazole 150 MG tablet  Commonly known as:  DIFLUCAN  Take 1 tablet (150 mg total) by mouth once.     glucose blood test strip  Commonly known as:  ONE TOUCH ULTRA TEST  - 1 each by Other route every morning. Use to check blood sugars every morning  - Dx E11.9     ibuprofen 200 MG tablet  Commonly known as:  ADVIL,MOTRIN  Take 400 mg by mouth every 6 (six) hours as needed for headache or moderate pain.     KLOR-CON M10 10 MEQ tablet  Generic drug:  potassium chloride  TAKE 1 TABLET (10 MEQ TOTAL) BY MOUTH DAILY.     levothyroxine 50 MCG tablet  Commonly known as:  SYNTHROID, LEVOTHROID  Take 0.5 tablets (25 mcg total) by mouth daily before breakfast.     lisinopril 40 MG tablet  Commonly known as:  PRINIVIL,ZESTRIL  Take 1 tablet (40 mg total) by mouth daily.     metFORMIN 500 MG 24 hr tablet  Commonly known as:  GLUCOPHAGE-XR  Take 1 tablet (500 mg total) by mouth daily with breakfast.     nitrofurantoin (macrocrystal-monohydrate) 100 MG capsule  Commonly known as:  MACROBID  Take 1 capsule (100 mg total) by mouth 2 (two) times daily.     pantoprazole 40 MG tablet  Commonly known as:  PROTONIX  Take 40 mg by mouth every morning.     SYSTANE OP  Apply 1 drop to eye 2 (two) times daily.  terconazole 0.4 % vaginal cream  Commonly known as:  TERAZOL 7  Place 1 applicator vaginally at bedtime.     triamterene-hydrochlorothiazide 37.5-25 MG per tablet  Commonly known as:  MAXZIDE-25  Take 0.5 tablets by mouth daily.        Activity: no heavy lifting for 4 weeks Diet: bariatric protein shakes Wound Care: none needed  Follow-up:  With Dr. Excell Seltzer in 3 weeks.  Signed: Edward Jolly MD, FACS  12/10/2014, 8:18 AM

## 2014-12-10 NOTE — Progress Notes (Signed)
Nurse reviewed discharge instructions with pt.  Pt verbalized understanding of discharge instructions, follow up appointments and new medications.  No concerns at time of discharge. 

## 2014-12-11 ENCOUNTER — Telehealth (HOSPITAL_COMMUNITY): Payer: Self-pay

## 2014-12-11 NOTE — Telephone Encounter (Signed)
Made discharge phone call to patient per DROP protocol. Asking the following questions.    1. Do you have someone to care for you now that you are home?  yes 2. Are you having pain now that is not relieved by your pain medication?  No pain at all 3. Are you able to drink the recommended daily amount of fluids (48 ounces minimum/day) and protein (60-80 grams/day) as prescribed by the dietitian or nutritional counselor?  2 shakes today and fluids throughout the day 4. Are you taking the vitamins and minerals as prescribed?  yes 5. Do you have the "on call" number to contact your surgeon if you have a problem or question?  yes 6. Are your incisions free of redness, swelling or drainage? (If steri strips, address that these can fall off, shower as tolerated) no 7. Have your bowels moved since your surgery?  If not, are you passing gas?  yes 8. Are you up and walking 3-4 times per day?  yes    1. Do you have an appointment made to see your surgeon in the next month?  yes 2. Were you provided your discharge medications before your surgery or before you were discharged from the hospital and are you taking them without problem?  yes 3. Were you provided phone numbers to the clinic/surgeon's office?  yes 4. Did you watch the patient education video module in the (clinic, surgeon's office, etc.) before your surgery? yes 5. Do you have a discharge checklist that was provided to you in the hospital to reference with instructions on how to take care of yourself after surgery?  yes 6. Did you see a dietitian or nutritional counselor while you were in the hospital?  yes 7. Do you have an appointment to see a dietitian or nutritional counselor in the next month?  yes

## 2014-12-12 ENCOUNTER — Ambulatory Visit: Payer: Medicare Other | Admitting: Internal Medicine

## 2014-12-16 ENCOUNTER — Other Ambulatory Visit (INDEPENDENT_AMBULATORY_CARE_PROVIDER_SITE_OTHER): Payer: Medicare Other

## 2014-12-16 ENCOUNTER — Ambulatory Visit (INDEPENDENT_AMBULATORY_CARE_PROVIDER_SITE_OTHER): Payer: Medicare Other | Admitting: Internal Medicine

## 2014-12-16 ENCOUNTER — Encounter: Payer: Self-pay | Admitting: Internal Medicine

## 2014-12-16 VITALS — BP 118/72 | HR 70 | Temp 98.5°F | Wt 254.0 lb

## 2014-12-16 DIAGNOSIS — E119 Type 2 diabetes mellitus without complications: Secondary | ICD-10-CM

## 2014-12-16 DIAGNOSIS — R300 Dysuria: Secondary | ICD-10-CM

## 2014-12-16 DIAGNOSIS — D62 Acute posthemorrhagic anemia: Secondary | ICD-10-CM | POA: Insufficient documentation

## 2014-12-16 DIAGNOSIS — I10 Essential (primary) hypertension: Secondary | ICD-10-CM

## 2014-12-16 LAB — URINALYSIS, ROUTINE W REFLEX MICROSCOPIC
Bilirubin Urine: NEGATIVE
HGB URINE DIPSTICK: NEGATIVE
LEUKOCYTES UA: NEGATIVE
Nitrite: NEGATIVE
RBC / HPF: NONE SEEN (ref 0–?)
Specific Gravity, Urine: 1.02 (ref 1.000–1.030)
Total Protein, Urine: NEGATIVE
UROBILINOGEN UA: 0.2 (ref 0.0–1.0)
Urine Glucose: NEGATIVE
WBC, UA: NONE SEEN (ref 0–?)
pH: 6 (ref 5.0–8.0)

## 2014-12-16 MED ORDER — LISINOPRIL 40 MG PO TABS: ORAL_TABLET | ORAL | Status: DC

## 2014-12-16 MED ORDER — METFORMIN HCL ER 500 MG PO TB24: 500.0000 mg | ORAL_TABLET | Freq: Every day | ORAL | Status: DC

## 2014-12-16 NOTE — Patient Instructions (Signed)
OK to hold on taking the metformin, unless your sugars start to be more than 150's  OK to take HALF of the lisinopril 40 mg - (so would be taking 20 mg only)  OK to take iron sulfate 325 mg per day for 2-3 months, with colace 100 mg twice per day as needed for stool softner  Please continue all other medications as before  Please have the pharmacy call with any other refills you may need.  Please keep your appointments with your specialists as you may have planned  Please return in 2 months, or sooner if needed, as we may need to check the thyroid test as well as follow up sugar and blood pressure and anemia

## 2014-12-16 NOTE — Assessment & Plan Note (Signed)
Ok to hold metformiin for now on current diet, to restart for cbg > 150 Lab Results  Component Value Date   HGBA1C 6.0 03/21/2014

## 2014-12-16 NOTE — Assessment & Plan Note (Signed)
?   UTI - for UA today,  to f/u any worsening symptoms or concerns
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
## 2014-12-16 NOTE — Assessment & Plan Note (Signed)
Ok to take HALF lisinopril 40 mg qd in anticipation of wt loss and liekly BP lowering

## 2014-12-16 NOTE — Progress Notes (Signed)
Pre visit review using our clinic review tool, if applicable. No additional management support is needed unless otherwise documented below in the visit note. 

## 2014-12-16 NOTE — Assessment & Plan Note (Signed)
Ok for iron sulfate 325 mg qd, and colace 100 bid prn for 3 months

## 2014-12-17 ENCOUNTER — Encounter: Payer: Self-pay | Admitting: Internal Medicine

## 2014-12-17 DIAGNOSIS — R300 Dysuria: Secondary | ICD-10-CM

## 2014-12-18 ENCOUNTER — Telehealth: Payer: Self-pay | Admitting: *Deleted

## 2014-12-18 DIAGNOSIS — I10 Essential (primary) hypertension: Secondary | ICD-10-CM

## 2014-12-18 NOTE — Addendum Note (Signed)
Addended by: Biagio Borg on: 12/18/2014 04:13 PM   Modules accepted: Orders

## 2014-12-18 NOTE — Telephone Encounter (Signed)
I would keep on lisinopril for now.  WIll follow BPs on this  Over time  WOuld not pull back now.

## 2014-12-18 NOTE — Telephone Encounter (Signed)
Patient coming for labs next Thursday. Will stop maxzide and K+  States Dr. Jenny Reichmann cut her lisinopril back to 1/2 tab. Would like to know if Dr. Harrington Challenger wants her dc this as well.  Her bp since her wt loss surgery has not gone above 950 systolically.

## 2014-12-23 ENCOUNTER — Encounter: Payer: Self-pay | Admitting: Internal Medicine

## 2014-12-23 ENCOUNTER — Encounter: Payer: Medicare Other | Attending: Internal Medicine

## 2014-12-23 DIAGNOSIS — Z713 Dietary counseling and surveillance: Secondary | ICD-10-CM | POA: Insufficient documentation

## 2014-12-23 DIAGNOSIS — Z6841 Body Mass Index (BMI) 40.0 and over, adult: Secondary | ICD-10-CM | POA: Insufficient documentation

## 2014-12-23 DIAGNOSIS — E669 Obesity, unspecified: Secondary | ICD-10-CM | POA: Insufficient documentation

## 2014-12-25 ENCOUNTER — Other Ambulatory Visit (INDEPENDENT_AMBULATORY_CARE_PROVIDER_SITE_OTHER): Payer: Medicare Other | Admitting: *Deleted

## 2014-12-25 DIAGNOSIS — I10 Essential (primary) hypertension: Secondary | ICD-10-CM

## 2014-12-25 LAB — BASIC METABOLIC PANEL
BUN: 13 mg/dL (ref 6–23)
CALCIUM: 9.6 mg/dL (ref 8.4–10.5)
CO2: 27 mEq/L (ref 19–32)
CREATININE: 1.02 mg/dL (ref 0.40–1.20)
Chloride: 106 mEq/L (ref 96–112)
GFR: 71.62 mL/min (ref >60.00)
Glucose, Bld: 98 mg/dL (ref 70–99)
Potassium: 3.7 mEq/L (ref 3.5–5.1)
Sodium: 140 mEq/L (ref 135–145)

## 2014-12-30 DIAGNOSIS — Z6841 Body Mass Index (BMI) 40.0 and over, adult: Secondary | ICD-10-CM | POA: Diagnosis not present

## 2014-12-30 DIAGNOSIS — E669 Obesity, unspecified: Secondary | ICD-10-CM | POA: Diagnosis not present

## 2014-12-30 DIAGNOSIS — Z713 Dietary counseling and surveillance: Secondary | ICD-10-CM | POA: Diagnosis not present

## 2014-12-30 NOTE — Progress Notes (Signed)
Bariatric Class:  Appt start time: 1530 end time:  1630.  2 Week Post-Operative Nutrition Class  Patient was seen on 12/30/14 for Post-Operative Nutrition education at [facility].   Surgery date: 12/08/14 Surgery type: RYGB Start weight at [facility]: 262.5 lbs on 04/07/14 Weight today: 240 lbs  Weight change: 15.5 lbs  TANITA  BODY COMP RESULTS  11/24/14 12/30/14   BMI (kg/m^2) 43.9 41.2   Fat Mass (lbs) 139 124.0   Fat Free Mass (lbs) 116.5 116.0   Total Body Water (lbs) 85.5 85.0    The following the learning objectives were met by the patient during this course:  Identifies Phase 3A (Soft, High Proteins) Dietary Goals and will begin from 2 weeks post-operatively to 2 months post-operatively  Identifies appropriate sources of fluids and proteins   States protein recommendations and appropriate sources post-operatively  Identifies the need for appropriate texture modifications, mastication, and bite sizes when consuming solids  Identifies appropriate multivitamin and calcium sources post-operatively  Describes the need for physical activity post-operatively and will follow MD recommendations  States when to call healthcare provider regarding medication questions or post-operative complications  Handouts given during class include:  Phase 3A: Soft, High Protein Diet Handout  Follow-Up Plan: Patient will follow-up at [facility] in 6 weeks for 2 month post-op nutrition visit for diet advancement per MD.    

## 2015-01-01 ENCOUNTER — Other Ambulatory Visit: Payer: Self-pay

## 2015-01-01 ENCOUNTER — Encounter: Payer: Self-pay | Admitting: Internal Medicine

## 2015-01-01 MED ORDER — LISINOPRIL 20 MG PO TABS: 20.0000 mg | ORAL_TABLET | Freq: Every day | ORAL | Status: DC

## 2015-01-20 ENCOUNTER — Ambulatory Visit (INDEPENDENT_AMBULATORY_CARE_PROVIDER_SITE_OTHER): Payer: Medicare Other

## 2015-01-20 ENCOUNTER — Ambulatory Visit (INDEPENDENT_AMBULATORY_CARE_PROVIDER_SITE_OTHER): Payer: 59 | Admitting: Family Medicine

## 2015-01-20 VITALS — BP 104/60 | HR 74 | Temp 98.6°F | Resp 20 | Ht 64.0 in | Wt 230.2 lb

## 2015-01-20 DIAGNOSIS — M79662 Pain in left lower leg: Secondary | ICD-10-CM

## 2015-01-20 DIAGNOSIS — M1712 Unilateral primary osteoarthritis, left knee: Secondary | ICD-10-CM | POA: Diagnosis not present

## 2015-01-20 DIAGNOSIS — M79661 Pain in right lower leg: Secondary | ICD-10-CM

## 2015-01-20 DIAGNOSIS — S86899A Other injury of other muscle(s) and tendon(s) at lower leg level, unspecified leg, initial encounter: Secondary | ICD-10-CM

## 2015-01-20 MED ORDER — MELOXICAM 15 MG PO TABS: 15.0000 mg | ORAL_TABLET | Freq: Every day | ORAL | Status: DC

## 2015-01-20 NOTE — Patient Instructions (Signed)
Shin Splints Shin splints is a painful condition that is felt on the shinbone or in the muscles on either side of the bone (front of your lower leg). Shin splints happen when physical activities, such as sports or other demanding exercise, leads to inflammation of the muscles, tendons, and the thin layer that covers the shinbone.  CAUSES   Overuse of muscles.  Repetitive activities.  Flat feet or rigid arches. Activities that could contribute to shin splints include:  A sudden increase in exercise time.  Starting a new, demanding activity.  Running up hills or long distances.  Playing sports with sudden starts and stops.  A poor warm up.  Old or worn-out shoes. SYMPTOMS   Pain on the front of the leg.  Pain while exercising or at rest. DIAGNOSIS  Your caregiver will diagnose shin splints from a history of your symptoms and a physical exam. You may be observed as you walk or run. X-ray exams or further testing may be needed to rule out other problems, such as a stress fracture, which also causes lower leg pain. TREATMENT  Your caregiver may decide on the treatment based on your age, history, health, and how bad the pain is. Most cases of shin splints can be managed by one or more of the following:  Resting.  Reducing the length and intensity of your exercise.  Stopping the activity that causes shin pain.  Taking medicines to control the inflammation.  Icing, massaging, stretching, and strengthening the affected area.  Getting shoes with rigid heels, shock absorption, and a good arch support. HOME CARE INSTRUCTIONS   Resume activity steadily or as directed by your caregiver.  Restart your exercise sessions with non-weight-bearing exercises, such as cycling or swimming.  Stop running if the pain returns.  Warm up properly before exercising.  Run on a level and fairly firm surface.  Gradually change the intensity of an exercise.  Limit increases in running  distance by no more than 5 to 10% weekly. This means if you are running 5 miles, you can only increase your run by 1/2 a mile at a time.  Change your athletic shoes every 6 months, or every 350 to 450 miles. SEEK MEDICAL CARE IF:   Symptoms continue or worsen even after treatment.  The location, intensity, or type of pain changes over time. SEEK IMMEDIATE MEDICAL CARE IF:   You have severe pain.  You have trouble walking. MAKE SURE YOU:  Understand these instructions.  Will watch your condition.  Will get help right away if you are not doing well or get worse. Document Released: 07/22/2000 Document Revised: 10/17/2011 Document Reviewed: 01/09/2011 [facility] Patient Information 2015 [place], [place]. This information is not intended to replace advice given to you by your health care provider. Make sure you discuss any questions you have with your health care provider.
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
## 2015-02-05 ENCOUNTER — Other Ambulatory Visit: Payer: Self-pay | Admitting: Internal Medicine

## 2015-02-11 ENCOUNTER — Encounter: Payer: Self-pay | Admitting: Dietician

## 2015-02-11 ENCOUNTER — Encounter: Payer: Medicare Other | Attending: Internal Medicine | Admitting: Dietician

## 2015-02-11 DIAGNOSIS — Z713 Dietary counseling and surveillance: Secondary | ICD-10-CM | POA: Diagnosis not present

## 2015-02-11 DIAGNOSIS — E669 Obesity, unspecified: Secondary | ICD-10-CM | POA: Diagnosis not present

## 2015-02-11 DIAGNOSIS — Z6841 Body Mass Index (BMI) 40.0 and over, adult: Secondary | ICD-10-CM | POA: Diagnosis not present

## 2015-02-11 NOTE — Patient Instructions (Addendum)
Goals:  Follow Phase 3B: High Protein + Non-Starchy Vegetables  Eat 3-6 small meals/snacks, every 3-5 hrs  Increase lean protein foods to meet 60g goal  Increase fluid intake to 64oz +  Avoid drinking 15 minutes before, during and 30 minutes after eating  Aim for >30 min of physical activity daily  Have just one protein shake per day  Surgery date: 12/08/14 Surgery type: RYGB Start weight at [facility]: 262.5 lbs on 04/07/14 Weight today: 224.5  lbs  Weight change: 15.5 lbs Total weight loss: 38 lbs  TANITA  BODY COMP RESULTS  11/24/14 12/30/14 02/11/15   BMI (kg/m^2) 43.9 41.2 38.5   Fat Mass (lbs) 139 124.0 109.5   Fat Free Mass (lbs) 116.5 116.0 115.0   Total Body Water (lbs) 85.5 85.0 84.0

## 2015-02-11 NOTE — Progress Notes (Signed)
  Follow-up visit:  8 Weeks Post-Operative RYGB Surgery  Medical Nutrition Therapy:  Appt start time: 0830 end time:  0900.  Primary concerns today: Post-operative Bariatric Surgery Nutrition Management. Returns with a 15.5 lb weight loss. Starting doing the BELT program a few weeks ago. Has bone on bone in her in knee.   Has started feeling hungrier lately.   Surgery date: 12/08/14 Surgery type: RYGB Start weight at [facility]: 262.5 lbs on 04/07/14 Weight today: 224.5  lbs  Weight change: 15.5 lbs Total weight loss: 38 lbs  TANITA  BODY COMP RESULTS  11/24/14 12/30/14 02/11/15   BMI (kg/m^2) 43.9 41.2 38.5   Fat Mass (lbs) 139 124.0 109.5   Fat Free Mass (lbs) 116.5 116.0 115.0   Total Body Water (lbs) 85.5 85.0 84.0    Preferred Learning Style:   No preference indicated   Learning Readiness:   Ready  24-hr recall: B (AM): scrambled egg with cheese with yogurt or premier protein shake (24-30 g) Snk (AM): jello or yogurt or cheese (0-12) L (PM): 2-3 oz chicken/fish/shrimp with green beans or cauliflower (14-21 g) Snk (PM): none  D (PM): 2-3 oz chicken/fish/shrimp with green beans or cauliflower (14-21 g) Snk (PM): protein shake  Fluid intake: 1 protein shake per day 11 oz, at least 40 oz water, 8 oz decaf coffee with sugar free hazelnut creamer and miralax  (~60 oz) Estimated total protein intake: 72-82  Medications: see list  Supplementation: taking  CBG monitoring: 1 x day  Average CBG per patient:  90-100 mg/dl Last patient reported A1c: not sure   Using straws: No Drinking while eating: sometimes  Hair loss: No Carbonated beverages: No N/V/D/C: constipation and taking miralax and suppositories Dumping syndrome: No   Recent physical activity:  BELT 3 x week and walking on other days, plans to start water aerobics  Progress Towards Goal(s):  In progress.  Handouts given during visit include:  Phase 3B High Protein + Non Starchy Vegetables  High Protein  Snacks   Nutritional Diagnosis:  [place]-3.3 Overweight/obesity related to past poor dietary habits and physical inactivity as evidenced by patient w/ recent RYGB surgery following dietary guidelines for continued weight loss.    Intervention:  Nutrition education/diet advancment. Goals:  Follow Phase 3B: High Protein + Non-Starchy Vegetables  Eat 3-6 small meals/snacks, every 3-5 hrs  Increase lean protein foods to meet 60g goal  Increase fluid intake to 64oz +  Avoid drinking 15 minutes before, during and 30 minutes after eating  Aim for >30 min of physical activity daily  Have just one protein shake per day  Teaching Method Utilized:  Visual Auditory Hands on  Barriers to learning/adherence to lifestyle change: none  Demonstrated degree of understanding via:  Teach Back   Monitoring/Evaluation:  Dietary intake, exercise, and body weight. Follow up in 1 months for 3 month post-op visit.

## 2015-02-17 DIAGNOSIS — R262 Difficulty in walking, not elsewhere classified: Secondary | ICD-10-CM | POA: Diagnosis not present

## 2015-02-17 DIAGNOSIS — M170 Bilateral primary osteoarthritis of knee: Secondary | ICD-10-CM | POA: Diagnosis not present

## 2015-02-17 DIAGNOSIS — M25561 Pain in right knee: Secondary | ICD-10-CM | POA: Diagnosis not present

## 2015-02-17 DIAGNOSIS — M25562 Pain in left knee: Secondary | ICD-10-CM | POA: Diagnosis not present

## 2015-02-23 DIAGNOSIS — M25561 Pain in right knee: Secondary | ICD-10-CM | POA: Diagnosis not present

## 2015-02-23 DIAGNOSIS — M170 Bilateral primary osteoarthritis of knee: Secondary | ICD-10-CM | POA: Diagnosis not present

## 2015-02-23 DIAGNOSIS — M25562 Pain in left knee: Secondary | ICD-10-CM | POA: Diagnosis not present

## 2015-02-23 DIAGNOSIS — M1712 Unilateral primary osteoarthritis, left knee: Secondary | ICD-10-CM | POA: Diagnosis not present

## 2015-02-25 DIAGNOSIS — M25561 Pain in right knee: Secondary | ICD-10-CM | POA: Diagnosis not present

## 2015-02-25 DIAGNOSIS — M170 Bilateral primary osteoarthritis of knee: Secondary | ICD-10-CM | POA: Diagnosis not present

## 2015-02-25 DIAGNOSIS — M1711 Unilateral primary osteoarthritis, right knee: Secondary | ICD-10-CM | POA: Diagnosis not present

## 2015-02-25 DIAGNOSIS — M25562 Pain in left knee: Secondary | ICD-10-CM | POA: Diagnosis not present

## 2015-03-03 DIAGNOSIS — M170 Bilateral primary osteoarthritis of knee: Secondary | ICD-10-CM | POA: Diagnosis not present

## 2015-03-03 DIAGNOSIS — M25562 Pain in left knee: Secondary | ICD-10-CM | POA: Diagnosis not present

## 2015-03-03 DIAGNOSIS — M25561 Pain in right knee: Secondary | ICD-10-CM | POA: Diagnosis not present

## 2015-03-03 DIAGNOSIS — M1712 Unilateral primary osteoarthritis, left knee: Secondary | ICD-10-CM | POA: Diagnosis not present

## 2015-03-10 DIAGNOSIS — M25561 Pain in right knee: Secondary | ICD-10-CM | POA: Diagnosis not present

## 2015-03-10 DIAGNOSIS — M1711 Unilateral primary osteoarthritis, right knee: Secondary | ICD-10-CM | POA: Diagnosis not present

## 2015-03-16 ENCOUNTER — Encounter: Payer: Medicare Other | Attending: Internal Medicine | Admitting: Dietician

## 2015-03-16 ENCOUNTER — Encounter: Payer: Self-pay | Admitting: Dietician

## 2015-03-16 VITALS — Ht 64.0 in | Wt 215.0 lb

## 2015-03-16 DIAGNOSIS — E669 Obesity, unspecified: Secondary | ICD-10-CM | POA: Diagnosis not present

## 2015-03-16 DIAGNOSIS — Z713 Dietary counseling and surveillance: Secondary | ICD-10-CM | POA: Diagnosis not present

## 2015-03-16 DIAGNOSIS — Z6841 Body Mass Index (BMI) 40.0 and over, adult: Secondary | ICD-10-CM | POA: Diagnosis not present

## 2015-03-16 NOTE — Patient Instructions (Addendum)
Goals:  Follow Phase 3B: High Protein + Non-Starchy Vegetables  Eat 3-6 small meals/snacks, every 3-5 hrs  Have protein foods in 60-80g range  Increase fluid intake to 64oz +  Avoid drinking 15 minutes before, during and 30 minutes after eating  Aim for >30 min of physical activity daily  Have just one protein shake per day and limit yogurt to 2 per day  Have protein shake and yogurt 2 hours apart  If you are hungry, increase fiber (vegetables or things you have to chew)  Surgery date: 12/08/14 Surgery type: RYGB Start weight at [facility]: 262.5 lbs on 04/07/14 Weight today: 215.0  Lbs  Weight change: 9.5 lbs Total weight loss: 47.5 lbs  TANITA  BODY COMP RESULTS  11/24/14 12/30/14 02/11/15 03/16/15   BMI (kg/m^2) 43.9 41.2 38.5 36.9   Fat Mass (lbs) 139 124.0 109.5 103.0   Fat Free Mass (lbs) 116.5 116.0 115.0 112.0   Total Body Water (lbs) 85.5 85.0 84.0 82.0

## 2015-03-16 NOTE — Progress Notes (Signed)
  Follow-up visit:  12 Weeks Post-Operative RYGB Surgery  Medical Nutrition Therapy:  Appt start time: 0910 end time: 945  Primary concerns today: Post-operative Bariatric Surgery Nutrition Management. Returns with a 9.5 lb weight loss. Doing BELT and getting injections in her knee and getting physical therapy. Has been by physical therapist to not do too much activity so she wouldn't stress knees. Will start water fitness class after injections (3 weeks).  Surgery date: 12/08/14 Surgery type: RYGB Start weight at [facility]: 262.5 lbs on 04/07/14 Weight today: 215.0  Lbs  Weight change: 9.5 lbs Total weight loss: 47.5 lbs  TANITA  BODY COMP RESULTS  11/24/14 12/30/14 02/11/15 03/16/15   BMI (kg/m^2) 43.9 41.2 38.5 36.9   Fat Mass (lbs) 139 124.0 109.5 103.0   Fat Free Mass (lbs) 116.5 116.0 115.0 112.0   Total Body Water (lbs) 85.5 85.0 84.0 82.0    Preferred Learning Style:   No preference indicated   Learning Readiness:   Ready  24-hr recall: B (AM): scrambled egg with cheese or  [employer] yogurt and premier protein shake (12-42 g) Snk (AM): jello or cheese/meat or yogurt (0-12 g) L (PM): 2-3 oz chicken with salad with light ranch (14-21 g) Snk (PM): yogurt or SF popsicle or nuts (0-12)  D (PM): 2-3 oz chicken/fish/shrimp with green beans or cauliflower (14-21 g) Snk (PM): SF posicle, nuts, jello, yogurt (0-12 g)  Fluid intake: 1 protein shake per day 11 oz, at least 40 oz water, 8 oz decaf coffee with sugar free hazelnut creamer and miralax  (around 64 oz) Estimated total protein intake: 40-120 g  Medications: see list  Supplementation: taking  CBG monitoring: 1 x day  Average CBG per patient:  90-100 mg/dl Last patient reported A1c: not sure   Using straws: No Drinking while eating: Not usually  Hair loss: No Carbonated beverages: No N/V/D/C: constipation getting better and taking miralax or benefiber Dumping syndrome: No   Recent physical activity:  BELT 3 x  week and getting physical therapy on Tuesday/Thursday  Progress Towards Goal(s):  In progress.  Handouts given during visit include:  High Protein Snacks   Nutritional Diagnosis:  [place]-3.3 Overweight/obesity related to past poor dietary habits and physical inactivity as evidenced by patient w/ recent RYGB surgery following dietary guidelines for continued weight loss.    Intervention:  Nutrition education/diet reinforcement  Goals:  Follow Phase 3B: High Protein + Non-Starchy Vegetables  Eat 3-6 small meals/snacks, every 3-5 hrs  Have protein foods in 60-80g range  Increase fluid intake to 64oz +  Avoid drinking 15 minutes before, during and 30 minutes after eating  Aim for >30 min of physical activity daily  Have just one protein shake per day and limit yogurt to 2 per day  Have protein shake and yogurt 2 hours apart  If you are hungry, increase fiber (vegetables or things you have to chew)  Teaching Method Utilized:  Visual Auditory Hands on  Barriers to learning/adherence to lifestyle change: none  Demonstrated degree of understanding via:  Teach Back   Monitoring/Evaluation:  Dietary intake, exercise, and body weight. Follow up in 6 weeks for 4.5 month post-op visit.

## 2015-03-17 DIAGNOSIS — M25561 Pain in right knee: Secondary | ICD-10-CM | POA: Diagnosis not present

## 2015-03-17 DIAGNOSIS — M25562 Pain in left knee: Secondary | ICD-10-CM | POA: Diagnosis not present

## 2015-03-17 DIAGNOSIS — M1712 Unilateral primary osteoarthritis, left knee: Secondary | ICD-10-CM | POA: Diagnosis not present

## 2015-03-17 DIAGNOSIS — R2689 Other abnormalities of gait and mobility: Secondary | ICD-10-CM | POA: Diagnosis not present

## 2015-03-17 DIAGNOSIS — M170 Bilateral primary osteoarthritis of knee: Secondary | ICD-10-CM | POA: Diagnosis not present

## 2015-03-19 DIAGNOSIS — K912 Postsurgical malabsorption, not elsewhere classified: Secondary | ICD-10-CM | POA: Diagnosis not present

## 2015-03-19 DIAGNOSIS — Z9884 Bariatric surgery status: Secondary | ICD-10-CM | POA: Diagnosis not present

## 2015-03-25 DIAGNOSIS — M1711 Unilateral primary osteoarthritis, right knee: Secondary | ICD-10-CM | POA: Diagnosis not present

## 2015-03-25 DIAGNOSIS — M25561 Pain in right knee: Secondary | ICD-10-CM | POA: Diagnosis not present

## 2015-03-26 DIAGNOSIS — M1712 Unilateral primary osteoarthritis, left knee: Secondary | ICD-10-CM | POA: Diagnosis not present

## 2015-03-26 DIAGNOSIS — M170 Bilateral primary osteoarthritis of knee: Secondary | ICD-10-CM | POA: Diagnosis not present

## 2015-03-26 DIAGNOSIS — M25561 Pain in right knee: Secondary | ICD-10-CM | POA: Diagnosis not present

## 2015-03-26 DIAGNOSIS — M25562 Pain in left knee: Secondary | ICD-10-CM | POA: Diagnosis not present

## 2015-04-14 DIAGNOSIS — M1711 Unilateral primary osteoarthritis, right knee: Secondary | ICD-10-CM | POA: Diagnosis not present

## 2015-04-14 DIAGNOSIS — M25561 Pain in right knee: Secondary | ICD-10-CM | POA: Diagnosis not present

## 2015-04-21 ENCOUNTER — Telehealth: Payer: Self-pay | Admitting: Internal Medicine

## 2015-04-21 NOTE — Telephone Encounter (Incomplete)
[place] [facility] - Client [facility]  Patient Name: Elizabeth Hawkins  DOB: 11/02/1955    Initial Comment Caller states she had Baratric surgery 4 months ago, she is feeling dizzy.   Nurse Assessment  Nurse: Elizabeth Emery, RN, Elizabeth Hawkins Date/Time Elizabeth Hawkins Time): 04/21/2015 11:24:58 AM  Confirm and document reason for call. If symptomatic, describe symptoms. ---Elizabeth Hawkins is c/o dizziness on and off for several days -- thinks medications need to be changed.  Has the patient traveled out of the country within the last 30 days? ---No  Does the patient require triage? ---Yes  Related visit to physician within the last 2 weeks? ---No  Does the PT have any chronic conditions? (i.e. diabetes, asthma, etc.) ---Yes  List chronic conditions. ---Post op 4 months May 2 bariatric surgery     Guidelines    Guideline Title Affirmed Question Affirmed Notes  Dizziness - Lightheadedness [1] MODERATE dizziness (e.g., interferes with normal activities) AND [2] has NOT been evaluated by physician for this (Exception: dizziness caused by heat exposure, sudden standing, or poor fluid intake)    Final Disposition User   See Physician within [place], RN, Elizabeth Hawkins    Comments  ***Made patient an appt for tomorrow with Elizabeth Reichmann, MD AFTER that fact advised she needed to cancel the 315pm appt that was given before being transferred to Triage Nurse Note: Nurse cancelled appt for 315pm tomorrow NEW appt 1115 am appt 04/22/15 with Dr. Jenny Hawkins scheduled   Referrals  REFERRED TO PCP OFFICE   Disagree/Comply: Comply

## 2015-04-22 ENCOUNTER — Ambulatory Visit (INDEPENDENT_AMBULATORY_CARE_PROVIDER_SITE_OTHER): Payer: Medicare Other | Admitting: Internal Medicine

## 2015-04-22 ENCOUNTER — Encounter: Payer: Self-pay | Admitting: Internal Medicine

## 2015-04-22 ENCOUNTER — Ambulatory Visit: Payer: Self-pay | Admitting: Internal Medicine

## 2015-04-22 VITALS — BP 114/76 | HR 70 | Temp 98.0°F | Ht 64.0 in | Wt 202.0 lb

## 2015-04-22 DIAGNOSIS — E119 Type 2 diabetes mellitus without complications: Secondary | ICD-10-CM

## 2015-04-22 DIAGNOSIS — E785 Hyperlipidemia, unspecified: Secondary | ICD-10-CM | POA: Diagnosis not present

## 2015-04-22 DIAGNOSIS — I10 Essential (primary) hypertension: Secondary | ICD-10-CM | POA: Diagnosis not present

## 2015-04-22 MED ORDER — LISINOPRIL 10 MG PO TABS: 10.0000 mg | ORAL_TABLET | Freq: Every day | ORAL | Status: DC

## 2015-04-22 NOTE — Patient Instructions (Signed)
OK to stop the lipitor  OK to decrease the lisinopril to 10 mg per day  Please continue all other medications as before, and refills have been done if requested.  Please have the pharmacy call with any other refills you may need.  Please continue your efforts at being more active, low cholesterol diet, and weight control.  Please keep your appointments with your specialists as you may have planned  Please return in 2 months, or sooner if needed
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
## 2015-04-23 DIAGNOSIS — M25561 Pain in right knee: Secondary | ICD-10-CM | POA: Diagnosis not present

## 2015-04-23 DIAGNOSIS — M25562 Pain in left knee: Secondary | ICD-10-CM | POA: Diagnosis not present

## 2015-04-23 DIAGNOSIS — M170 Bilateral primary osteoarthritis of knee: Secondary | ICD-10-CM | POA: Diagnosis not present

## 2015-04-26 NOTE — Assessment & Plan Note (Signed)
Mild overcontrolled, ok to decrease the lisinopril to 10 qd BP Readings from Last 3 Encounters:  04/22/15 114/76  01/20/15 104/60  12/16/14 118/72

## 2015-04-26 NOTE — Assessment & Plan Note (Signed)
overcontrolled with wt loss post surgury; d/c metformin, cont monitor , cont wt loss,  to f/u any worsening symptoms or concerns

## 2015-04-26 NOTE — Assessment & Plan Note (Signed)
Lab Results  Component Value Date   LDLCALC 111* 03/21/2014   Ok to [employer] lipitor per pt, for further wt loss, diet control, f/u lab next visit

## 2015-04-27 ENCOUNTER — Other Ambulatory Visit: Payer: Self-pay | Admitting: Internal Medicine

## 2015-04-28 ENCOUNTER — Encounter: Payer: Medicare Other | Attending: Internal Medicine | Admitting: Dietician

## 2015-04-28 ENCOUNTER — Encounter: Payer: Self-pay | Admitting: Dietician

## 2015-04-28 VITALS — Ht 64.0 in | Wt 203.5 lb

## 2015-04-28 DIAGNOSIS — Z713 Dietary counseling and surveillance: Secondary | ICD-10-CM | POA: Insufficient documentation

## 2015-04-28 DIAGNOSIS — Z6841 Body Mass Index (BMI) 40.0 and over, adult: Secondary | ICD-10-CM | POA: Insufficient documentation

## 2015-04-28 DIAGNOSIS — E669 Obesity, unspecified: Secondary | ICD-10-CM | POA: Diagnosis not present

## 2015-04-28 NOTE — Patient Instructions (Addendum)
Goals:  Follow Phase 3B: High Protein + Non-Starchy Vegetables  Eat 3-6 small meals/snacks, every 3-5 hrs  Continue to have protein foods in 60-80g range  Increase fluid intake to 64oz +  Avoid drinking 15 minutes before, during and 30 minutes after eating  Aim for >30 min of physical activity daily  If you are hungry, increase fiber (vegetables or things you have to chew)  If want to try fruit, have some apples, grapes, orange, berries etc after you protein  Try Parmesan Crisps [place] [employer])  Surgery date: 12/08/14 Surgery type: RYGB Start weight at [facility]: 262.5 lbs on 04/07/14 Weight today: 203.5 lbs  Weight change: 11.5 lbs Total weight loss: 59 lbs  TANITA  BODY COMP RESULTS  11/24/14 12/30/14 02/11/15 03/16/15 04/28/15   BMI (kg/m^2) 43.9 41.2 38.5 36.9 34.9   Fat Mass (lbs) 139 124.0 109.5 103.0 91.0   Fat Free Mass (lbs) 116.5 116.0 115.0 112.0 112.5   Total Body Water (lbs) 85.5 85.0 84.0 82.0 82.5

## 2015-04-28 NOTE — Progress Notes (Signed)
  Follow-up visit:  4.5 Months Post-Operative RYGB Surgery  Medical Nutrition Therapy:  Appt start time: 0930 end time: 1000  Primary concerns today: Post-operative Bariatric Surgery Nutrition Management. Returns with a 11.5 lb weight loss. Feeling good overall. Knee is feeling good. Stopped physical therapy 2 weeks ago. Did a 5k recently! Doing BELT and will be starting the HOPE program. No longer taking Protonix and cholesterol medication.    Surgery date: 12/08/14 Surgery type: RYGB Start weight at [facility]: 262.5 lbs on 04/07/14 Weight today: 203.5 lbs  Weight change: 11.5 lbs Total weight loss: 59 lbs  TANITA  BODY COMP RESULTS  11/24/14 12/30/14 02/11/15 03/16/15 04/28/15   BMI (kg/m^2) 43.9 41.2 38.5 36.9 34.9   Fat Mass (lbs) 139 124.0 109.5 103.0 91.0   Fat Free Mass (lbs) 116.5 116.0 115.0 112.0 112.5   Total Body Water (lbs) 85.5 85.0 84.0 82.0 82.5    Preferred Learning Style:   No preference indicated   Learning Readiness:   Ready  24-hr recall: B (AM): scrambled egg with cheese or premier protein shake (12-30 g) Snk (AM): jello or cheese/meat or yogurt (0-12 g) L (PM): 2-3 oz chicken with salad with light ranch (14-21 g) Snk (PM): yogurt or SF popsicle (0-12)  D (PM): 2-3 oz chicken/fish/shrimp with green beans or cauliflower (14-21 g) Snk (PM): SF popsicle   Fluid intake: 1 protein shake per day 11 oz, at least 50 oz water, 8 oz decaf coffee with sugar free hazelnut creamer and miralax  (64 oz+) Estimated total protein intake: ~70 g  Medications: see list  Supplementation: taking  CBG monitoring: 1 x day  Average CBG per patient:  90-100 mg/dl Last patient reported A1c: not sure   Using straws: No Drinking while eating: Not usually  Hair loss: No Carbonated beverages: No N/V/D/C: constipation getting better and taking benefiber every day Dumping syndrome: No  Recent physical activity:  BELT 3 x week, walking 30-45 minutes 3-4 x week   Progress Towards  Goal(s):  In progress.  Handouts given during visit include:  High Protein Snacks   Nutritional Diagnosis:  [place]-3.3 Overweight/obesity related to past poor dietary habits and physical inactivity as evidenced by patient w/ recent RYGB surgery following dietary guidelines for continued weight loss.    Intervention:  Nutrition education/diet reinforcement  Goals:  Follow Phase 3B: High Protein + Non-Starchy Vegetables  Eat 3-6 small meals/snacks, every 3-5 hrs  Continue to have protein foods in 60-80g range  Increase fluid intake to 64oz +  Avoid drinking 15 minutes before, during and 30 minutes after eating  Aim for >30 min of physical activity daily  If you are hungry, increase fiber (vegetables or things you have to chew)  If want to try fruit, have some apples, grapes, orange, berries etc after you protein  Try Parmesan Crisps [place] [employer])  Teaching Method Utilized:  Visual Auditory Hands on  Barriers to learning/adherence to lifestyle change: none  Demonstrated degree of understanding via:  Teach Back   Monitoring/Evaluation:  Dietary intake, exercise, and body weight. Follow up in 6 weeks for 4.5 month post-op visit.

## 2015-05-21 DIAGNOSIS — H04123 Dry eye syndrome of bilateral lacrimal glands: Secondary | ICD-10-CM | POA: Diagnosis not present

## 2015-05-21 DIAGNOSIS — E119 Type 2 diabetes mellitus without complications: Secondary | ICD-10-CM | POA: Diagnosis not present

## 2015-06-09 ENCOUNTER — Encounter: Payer: Medicare Other | Attending: Internal Medicine | Admitting: Dietician

## 2015-06-09 ENCOUNTER — Encounter: Payer: Self-pay | Admitting: Dietician

## 2015-06-09 DIAGNOSIS — Z6841 Body Mass Index (BMI) 40.0 and over, adult: Secondary | ICD-10-CM | POA: Insufficient documentation

## 2015-06-09 DIAGNOSIS — E669 Obesity, unspecified: Secondary | ICD-10-CM | POA: Insufficient documentation

## 2015-06-09 DIAGNOSIS — Z713 Dietary counseling and surveillance: Secondary | ICD-10-CM | POA: Diagnosis not present

## 2015-06-09 NOTE — Patient Instructions (Addendum)
Goals:  Follow Phase 3B: High Protein + Non-Starchy Vegetables  Eat 3-6 small meals/snacks, every 3-5 hrs  Continue to have protein foods in 60-80g range  Increase fluid intake to 64oz +  Avoid drinking 15 minutes before, during and 30 minutes after eating  Aim for >30 min of physical activity daily  If want to try fruit, have some apples, grapes, orange, berries etc after you have protein  Try Parmesan Crisps [place] [employer])  Surgery date: 12/08/14 Surgery type: RYGB [facility] at [facility]: 262.5 lbs on 04/07/14 Weight today: 193.0 lbs   Weight change: 9.5 lbs Total weight loss: 68.5 lbs  TANITA  BODY COMP RESULTS  11/24/14 12/30/14 02/11/15 03/16/15 04/28/15 06/09/15   BMI (kg/m^2) 43.9 41.2 38.5 36.9 34.9 33.1   Fat Mass (lbs) 139 124.0 109.5 103.0 91.0 84.0   Fat Free Mass (lbs) 116.5 116.0 115.0 112.0 112.5 109.0   Total Body Water (lbs) 85.5 85.0 84.0 82.0 82.5 80.0

## 2015-06-09 NOTE — Progress Notes (Signed)
  Follow-up visit:  6 Months Post-Operative RYGB Surgery  Medical Nutrition Therapy:  Appt start time: 0905 end time: 945  Primary concerns today: Post-operative Bariatric Surgery Nutrition Management. Returns with a 9.5 lb weight loss. Feeling good overall. Still doing the BELT program and will be starting the [place] program soon.   Surgery date: 12/08/14 Surgery type: RYGB Start weight at [facility]: 262.5 lbs on 04/07/14 Weight today: 193.0 lbs   Weight change: 9.5 lbs Total weight loss: 68.5 lbs  TANITA  BODY COMP RESULTS  11/24/14 12/30/14 02/11/15 03/16/15 04/28/15 06/09/15   BMI (kg/m^2) 43.9 41.2 38.5 36.9 34.9 33.1   Fat Mass (lbs) 139 124.0 109.5 103.0 91.0 84.0   Fat Free Mass (lbs) 116.5 116.0 115.0 112.0 112.5 109.0   Total Body Water (lbs) 85.5 85.0 84.0 82.0 82.5 80.0    Preferred Learning Style:   No preference indicated   Learning Readiness:   Ready  24-hr recall: B (AM): scrambled egg with cheese or premier protein shake (12-30 g) Snk (AM): jello or cheese/meat or yogurt (0-12 g) L (PM): 2-3 oz chicken with salad with light ranch or vegetable (14-21 g) Snk (PM): yogurt or SF popsicle (0-12)  D (PM): 2-3 oz chicken/fish/shrimp with green beans or cauliflower (14-21 g) Snk (PM): SF popsicle/yogurt or celery with hummus/spinach dip   Fluid intake: 1 protein shake per day 11 oz, at least 50 oz water, 8 oz decaf coffee with sugar free hazelnut creamer and miralax  (64 oz+) Estimated total protein intake: ~70 g  Medications: see list  Supplementation: taking  CBG monitoring: 1 x day  Average CBG per patient:  90-100 mg/dl Last patient reported A1c: not sure   Using straws: No Drinking while eating: Not usually  Hair loss: No Carbonated beverages: No N/V/D/C: constipation getting better and taking benefiber every day Dumping syndrome: one time after tasting rice  Recent physical activity:  BELT 3 x week, walking 30-45 minutes  Progress Towards Goal(s):  In  progress.  Handouts given during visit include:  none   Nutritional Diagnosis:  [place]-3.3 Overweight/obesity related to past poor dietary habits and physical inactivity as evidenced by patient w/ recent RYGB surgery following dietary guidelines for continued weight loss.    Intervention:  Nutrition education/diet reinforcement  Goals:  Follow Phase 3B: High Protein + Non-Starchy Vegetables  Eat 3-6 small meals/snacks, every 3-5 hrs  Continue to have protein foods in 60-80g range  Increase fluid intake to 64oz +  Avoid drinking 15 minutes before, during and 30 minutes after eating  Aim for >30 min of physical activity daily  If you are hungry, increase fiber (vegetables or things you have to chew)  If want to try fruit, have some apples, grapes, orange, berries etc after you protein  Try Parmesan Crisps [place] [employer])  Teaching Method Utilized:  Visual Auditory Hands on  Barriers to learning/adherence to lifestyle change: none  Demonstrated degree of understanding via:  Teach Back   Monitoring/Evaluation:  Dietary intake, exercise, and body weight. Follow up in 6 weeks for 7.5 month post-op visit.

## 2015-06-11 DIAGNOSIS — Z9884 Bariatric surgery status: Secondary | ICD-10-CM | POA: Diagnosis not present

## 2015-06-17 ENCOUNTER — Encounter: Payer: Self-pay | Admitting: Internal Medicine

## 2015-07-21 DIAGNOSIS — R262 Difficulty in walking, not elsewhere classified: Secondary | ICD-10-CM | POA: Diagnosis not present

## 2015-07-21 DIAGNOSIS — M170 Bilateral primary osteoarthritis of knee: Secondary | ICD-10-CM | POA: Diagnosis not present

## 2015-07-22 ENCOUNTER — Encounter: Payer: Self-pay | Admitting: Dietician

## 2015-07-22 ENCOUNTER — Encounter: Payer: Medicare Other | Attending: Internal Medicine | Admitting: Dietician

## 2015-07-22 DIAGNOSIS — E669 Obesity, unspecified: Secondary | ICD-10-CM | POA: Diagnosis not present

## 2015-07-22 DIAGNOSIS — Z713 Dietary counseling and surveillance: Secondary | ICD-10-CM | POA: Diagnosis not present

## 2015-07-22 DIAGNOSIS — Z6841 Body Mass Index (BMI) 40.0 and over, adult: Secondary | ICD-10-CM | POA: Diagnosis not present

## 2015-07-22 NOTE — Patient Instructions (Addendum)
Goals:  Follow Phase 3B: High Protein + Non-Starchy Vegetables  Eat 3-6 small meals/snacks, every 3-5 hrs  Continue to have protein foods in 60-80g range  Increase fluid intake to 64oz +  Avoid drinking 15 minutes before, during and 30 minutes after eating  Aim for >30 min of physical activity daily  If want to try fruit, have some apples, grapes, orange, berries etc after you have protein  Try Parmesan Crisps [place] [employer])  Surgery date: 12/08/14 Surgery type: RYGB Start weight at [facility]: 262.5 lbs on 04/07/14 Weight today: 190.5 lbs Weight change: 2.5 lbs Total weight loss: 71 lbs

## 2015-07-22 NOTE — Progress Notes (Signed)
  Follow-up visit:  7 Months Post-Operative RYGB Surgery  Medical Nutrition Therapy:  Appt start time: 0915 end time: 945  Primary concerns today: Post-operative Bariatric Surgery Nutrition Management. Returns with a 3 lb weight loss. She has noticed that her weight loss has slowed significantly. Would like to lose down to 180 lbs. Has found a balance with diet; ensures that she meets protein needs and has some fruit. Occasionally has a glass of wine. She is getting ready to begin the [facility] program in January.   Surgery date: 12/08/14 Surgery type: RYGB Start weight at [facility]: 262.5 lbs on 04/07/14 Weight today: 190.5 lbs Weight change: 2.5 lbs Total weight loss: 71 lbs  Weight loss goal: 180 lbs   TANITA  BODY COMP RESULTS  11/24/14 12/30/14 02/11/15 03/16/15 04/28/15 06/09/15 07/22/15   BMI (kg/m^2) 43.9 41.2 38.5 36.9 34.9 33.1 32.7   Fat Mass (lbs) 139 124.0 109.5 103.0 91.0 84.0 85   Fat Free Mass (lbs) 116.5 116.0 115.0 112.0 112.5 109.0 105.5   Total Body Water (lbs) 85.5 85.0 84.0 82.0 82.5 80.0 77    Preferred Learning Style:   No preference indicated   Learning Readiness:   Ready  24-hr recall: B (AM): scrambled egg with cheese and vegetables OR Premier protein shake, coffee with SF creamer (12-30 g) Snk (AM): jello or cheese/meat or yogurt (0-12 g) L (PM): 2-3 oz chicken or fish or burger wrapped in lettuce, vegetables or fruit (21 g) Snk (PM): yogurt or SF popsicle (0-12)  D (PM): 2-3 oz chicken/fish/shrimp with green beans or cauliflower (14-21 g) Snk (PM): SF popsicle/yogurt or celery with hummus/spinach dip   Fluid intake: 1 protein shake per day 11 oz, at least 50 oz water, 8 oz decaf coffee with sugar free hazelnut creamer and miralax  (64 oz+) Estimated total protein intake: ~70 g  Medications: see list; only thyroid medication Supplementation: taking  CBG monitoring: 1 x day  Average CBG per patient:  90-100 mg/dl Last patient reported A1c: not sure   Using  straws: No Drinking while eating: Not usually  Hair loss: No Carbonated beverages: No N/V/D/C: constipation getting better and taking benefiber every day Dumping syndrome: 1x  Recent physical activity:  BELT 3 x week, walking 30-45 minutes  Progress Towards Goal(s):  In progress.  Handouts given during visit include:  none   Nutritional Diagnosis:  [place]-3.3 Overweight/obesity related to past poor dietary habits and physical inactivity as evidenced by patient w/ recent RYGB surgery following dietary guidelines for continued weight loss.    Intervention:  Nutrition education/diet reinforcement  Goals:  Follow Phase 3B: High Protein + Non-Starchy Vegetables  Eat 3-6 small meals/snacks, every 3-5 hrs  Continue to have protein foods in 60-80g range  Increase fluid intake to 64oz +  Avoid drinking 15 minutes before, during and 30 minutes after eating  Aim for >30 min of physical activity daily  If you are hungry, increase fiber (vegetables or things you have to chew)  If want to try fruit, have some apples, grapes, orange, berries etc after you protein  Try Parmesan Crisps [place] [employer])  Teaching Method Utilized:  Visual Auditory Hands on  Barriers to learning/adherence to lifestyle change: none  Demonstrated degree of understanding via:  Teach Back   Monitoring/Evaluation:  Dietary intake, exercise, and body weight. Follow up in 6 weeks for 8.5 month post-op visit.

## 2015-08-16 IMAGING — RF DG UGI W/ GASTROGRAFIN
15 of 17 series · 15 of 17 positions shown · IV contrast (omnipaque)
Comparison: No priors.

CLINICAL DATA: 58-year-old female status post laparoscopic
Roux-en-Y gastric bypass and ventral hernia repair yesterday.

EXAM:
WATER SOLUBLE UPPER GI SERIES
TECHNIQUE: Single-column upper GI series was performed using water soluble
contrast.
CONTRAST:  20mL OMNIPAQUE IOHEXOL 300 MG/ML  SOLN

[Series 1: run · 1 of 1 slices shown (1 of 14)]
[im 1/1]
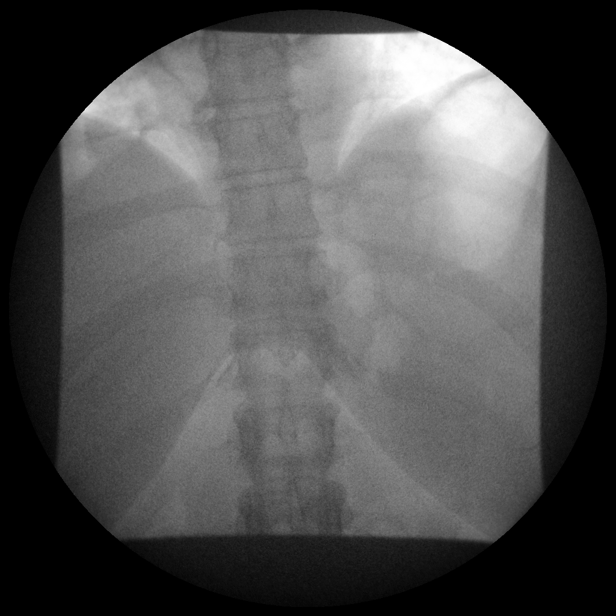

[Series 2: run · 1 of 1 slices shown (2 of 14)]
[im 1/1]
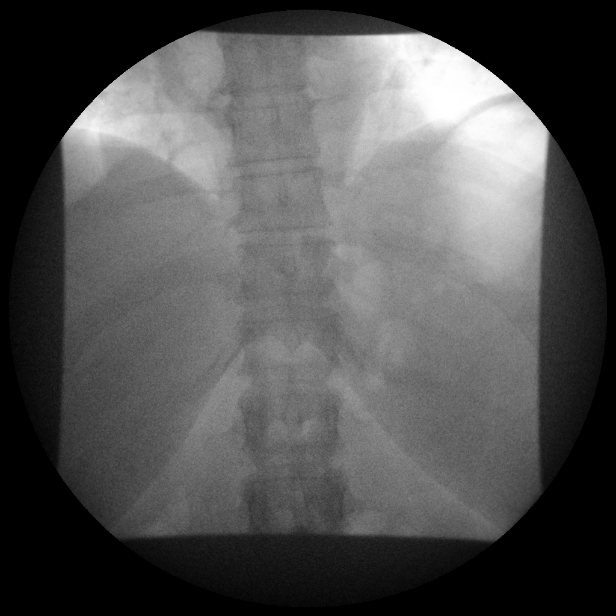

[Series 3: run · 1 of 1 slices shown (3 of 14)]
[im 1/1]
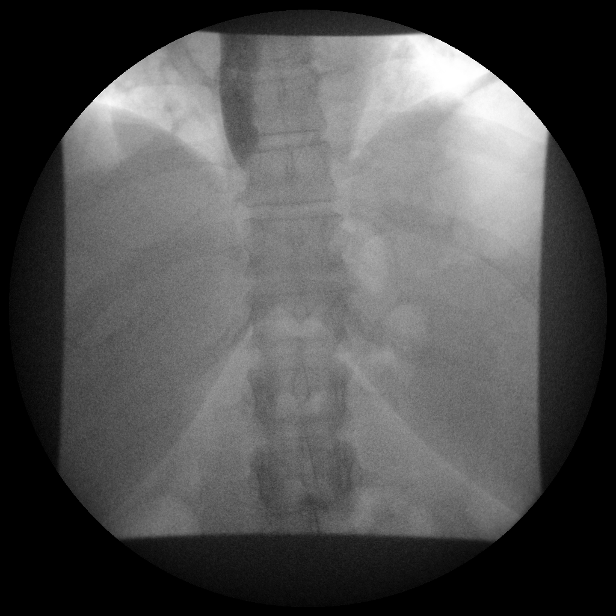

[Series 4: run · 1 of 1 slices shown (4 of 14)]
[im 1/1]
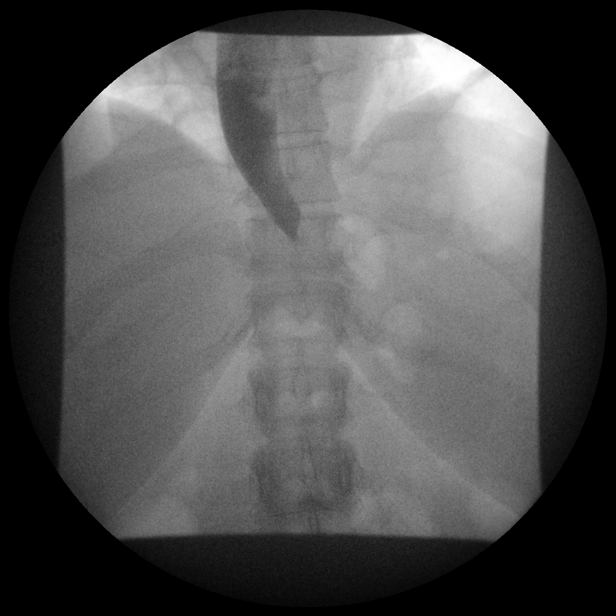

[Series 6: run · 1 of 1 slices shown (5 of 14)]
[im 1/1]
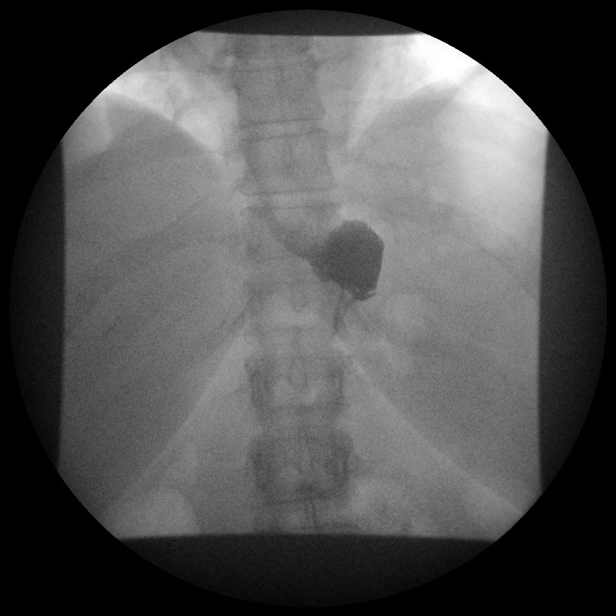

[Series 7: run · 1 of 1 slices shown (6 of 14)]
[im 1/1]
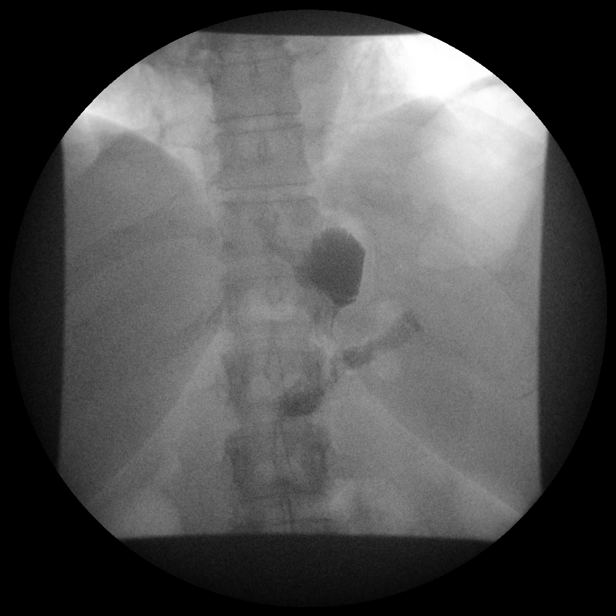

[Series 8: run · 1 of 1 slices shown (7 of 14)]
[im 1/1]
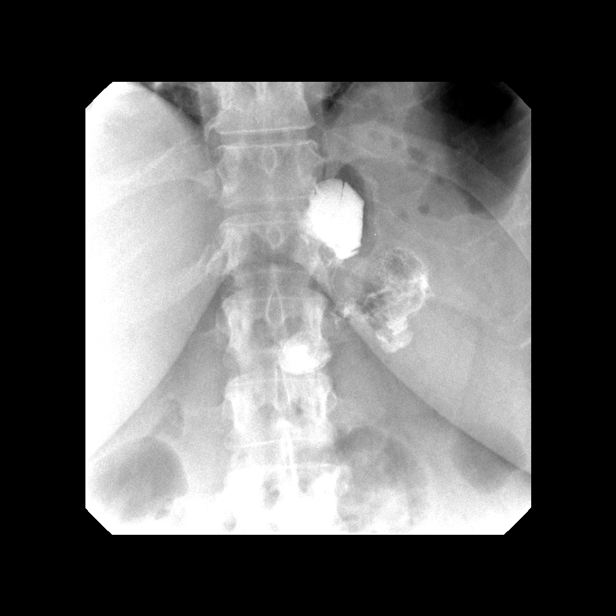

[Series 9: run · 1 of 1 slices shown (8 of 14)]
[im 1/1]
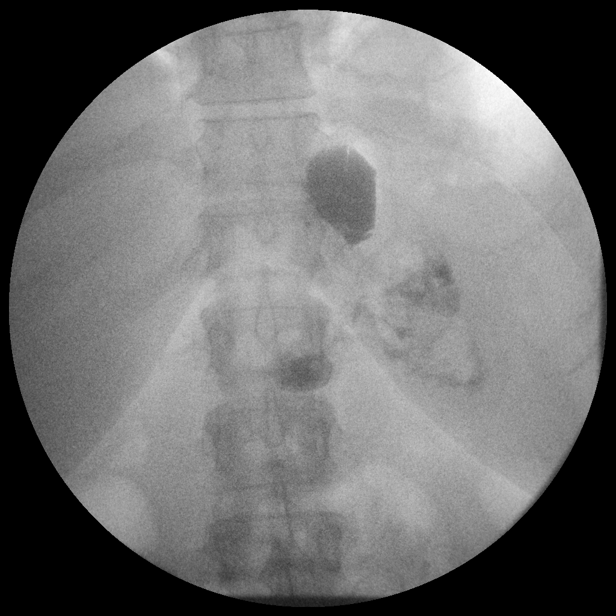

[Series 10: run · 1 of 1 slices shown (9 of 14)]
[im 1/1]
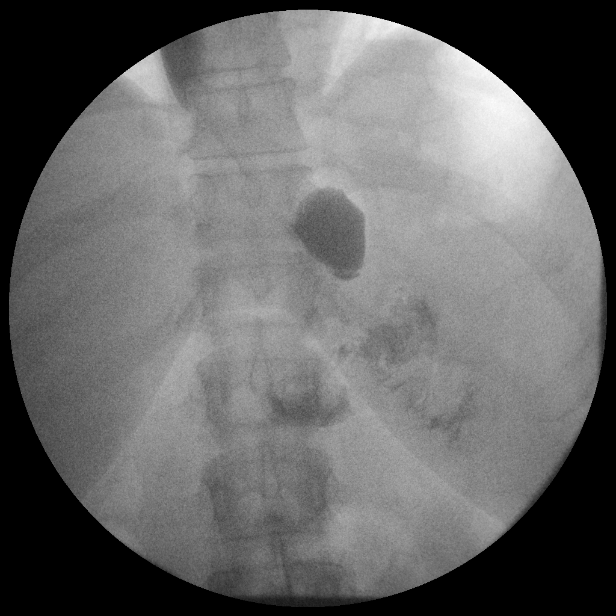

[Series 11: run · 1 of 1 slices shown (10 of 14)]
[im 1/1]
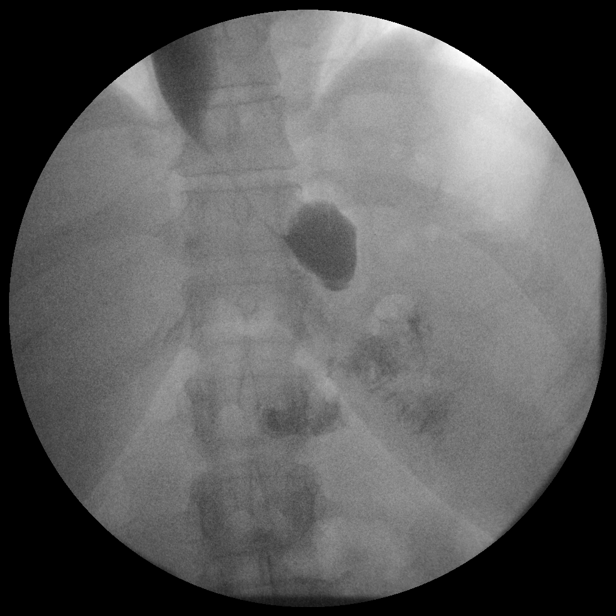

[Series 12: run · 1 of 1 slices shown (11 of 14)]
[im 1/1]
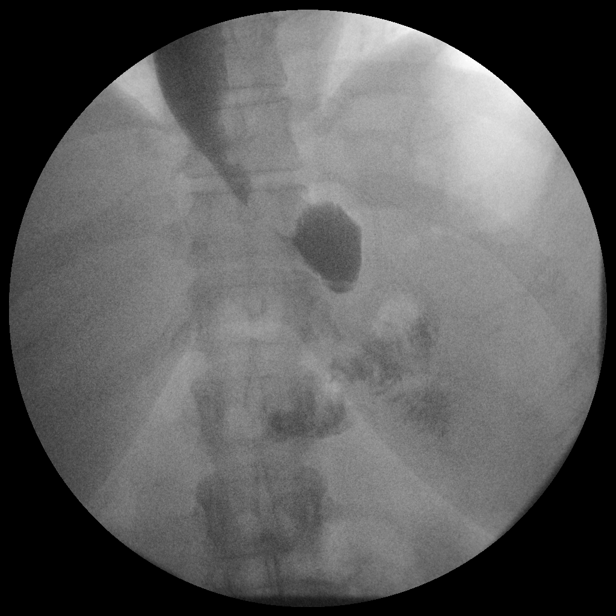

[Series 14: run · 1 of 1 slices shown (12 of 14)]
[im 1/1]
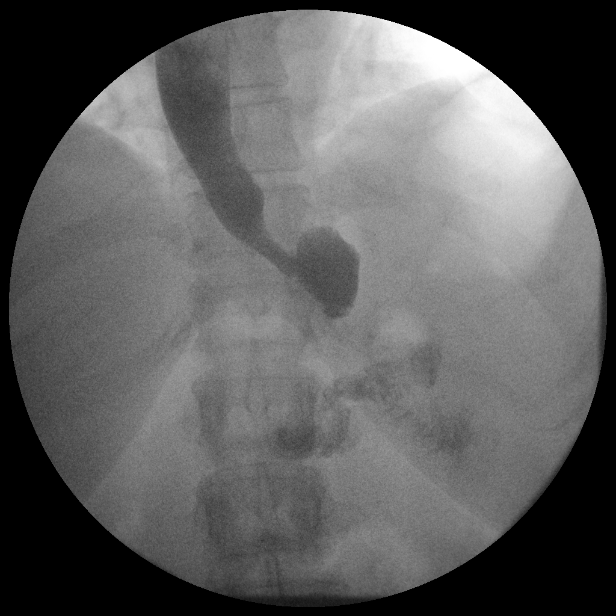

[Series 15: run · 1 of 1 slices shown (13 of 14)]
[im 1/1]
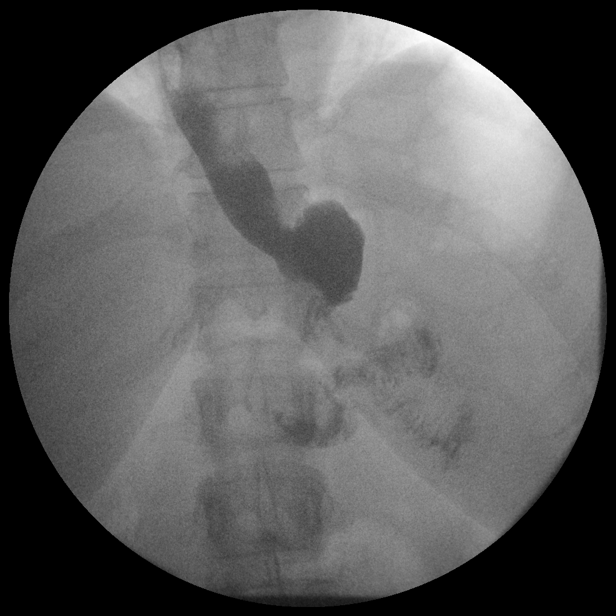

[Series 16: run · 1 of 1 slices shown (14 of 14)]
[im 1/1]
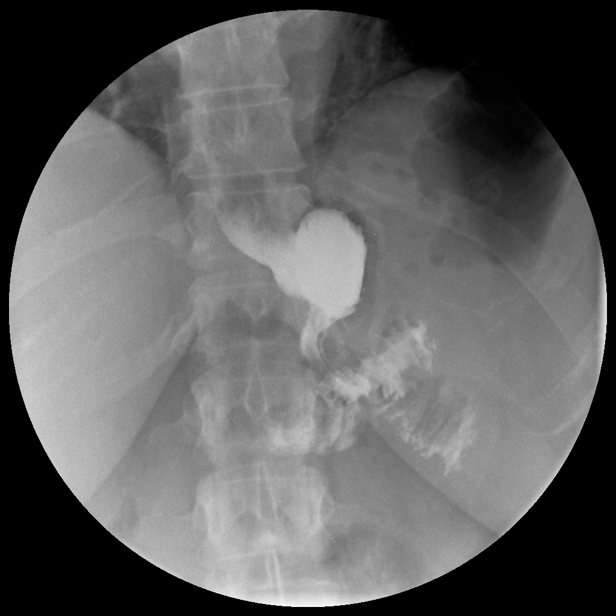

[Series 1001: view not recorded · 0.20mm/px · 1 of 1 slices shown]
[im 1/1]
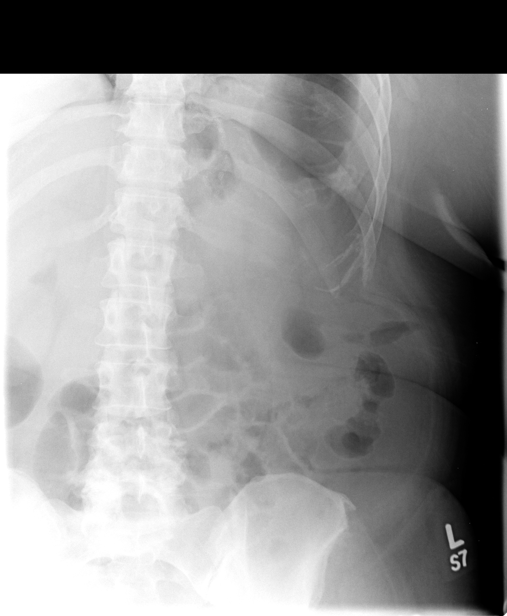

[15 of 17 positions shown; findings below may reference images not displayed]

FLUOROSCOPY TIME:  If the device does not provide the exposure
index:

Fluoroscopy Time (in minutes and seconds):  1 minutes and 2 seconds.

Number of Acquired Images:  17
FINDINGS: Preprocedure KUB demonstrated a nonobstructive bowel gas pattern.
Sutures were noted in the epigastric region.

Subsequently, following ingestion of Omnipaque the small gastric
remnant was opacified, and contrast immediately extended into the
small bowel. No extravasation of contrast was noted. No signs of
obstruction.
IMPRESSION: 1. Expected postoperative appearance following Roux-en-Y gastric
bypass, without evidence of extravasation or obstruction, as above.

## 2015-09-02 ENCOUNTER — Other Ambulatory Visit: Payer: Self-pay | Admitting: Internal Medicine

## 2015-09-02 ENCOUNTER — Encounter: Payer: Self-pay | Admitting: Internal Medicine

## 2015-09-16 ENCOUNTER — Encounter: Payer: Medicare Other | Attending: Internal Medicine | Admitting: Dietician

## 2015-09-16 ENCOUNTER — Encounter: Payer: Self-pay | Admitting: Dietician

## 2015-09-16 DIAGNOSIS — Z6841 Body Mass Index (BMI) 40.0 and over, adult: Secondary | ICD-10-CM | POA: Diagnosis not present

## 2015-09-16 DIAGNOSIS — E669 Obesity, unspecified: Secondary | ICD-10-CM | POA: Diagnosis not present

## 2015-09-16 DIAGNOSIS — Z713 Dietary counseling and surveillance: Secondary | ICD-10-CM | POA: Diagnosis not present

## 2015-09-16 NOTE — Patient Instructions (Addendum)
Goals:  Follow Phase 3B: High Protein + Non-Starchy Vegetables  Eat 3-6 small meals/snacks, every 3-5 hrs  Continue to have protein foods in 60-80g range  Increase fluid intake to 64oz +  Avoid drinking 15 minutes before, during and 30 minutes after eating  Aim for >30 min of physical activity daily  If want to try fruit, have some apples, grapes, orange, berries etc after you have protein  Try Parmesan Crisps [place] [employer])  Yoplait Greek 100, Chobani simply 100  Increase weight training to at least 2 days a week  Surgery date: 12/08/14 Surgery type: RYGB Start weight at [facility]: 262.5 lbs on 04/07/14 Weight today: 193.5 lbs Total weight loss: 68.5 lbs  TANITA  BODY COMP RESULTS  11/24/14 12/30/14 02/11/15 03/16/15 04/28/15 06/09/15 07/22/15 09/16/15   BMI (kg/m^2) 43.9 41.2 38.5 36.9 34.9 33.1 32.7 33.2   Fat Mass (lbs) 139 124.0 109.5 103.0 91.0 84.0 85 87.5   Fat Free Mass (lbs) 116.5 116.0 115.0 112.0 112.5 109.0 105.5 106   Total Body Water (lbs) 85.5 85.0 84.0 82.0 82.5 80.0 77 77.5

## 2015-09-16 NOTE — Progress Notes (Signed)
  Follow-up visit:  9 Months Post-Operative RYGB Surgery  Medical Nutrition Therapy:  Appt start time: 850 end time: [id number]  Primary concerns today: Post-operative Bariatric Surgery Nutrition Management. Returns with a 3 lb weight gain. She feels happy at her current weight. Has been eating more popcorn lately. Has follow up with surgeon in March. May have 1 glass of wine occasionally. Still off of cholesterol, blood pressure, and diabetes medications.   Surgery date: 12/08/14 Surgery type: RYGB Start weight at [facility]: 262.5 lbs on 04/07/14 Weight today: 193.5 lbs Weight change: 3.5 lbs gain Total weight loss: 68.5 lbs  TANITA  BODY COMP RESULTS  11/24/14 12/30/14 02/11/15 03/16/15 04/28/15 06/09/15 07/22/15 09/16/15   BMI (kg/m^2) 43.9 41.2 38.5 36.9 34.9 33.1 32.7 33.2   Fat Mass (lbs) 139 124.0 109.5 103.0 91.0 84.0 85 87.5   Fat Free Mass (lbs) 116.5 116.0 115.0 112.0 112.5 109.0 105.5 106   Total Body Water (lbs) 85.5 85.0 84.0 82.0 82.5 80.0 77 77.5    Preferred Learning Style:   No preference indicated   Learning Readiness:   Ready  24-hr recall: B (AM): scrambled egg with cheese and yogurt OR Premier protein shake and yogurt, coffee with SF creamer (22-32 g) Snk (AM):  L (PM): 2-3 oz chicken on salad with veggies and cheese and Ranch or [place] or balsamic dressing (21 g) Snk (PM):  D (PM): 2-3 oz baked chicken/fish/shrimp with green beans, salad, or beans OR chili with ground [place] (14-21 g) Snk (PM): skinny or homemade popcorn  Fluid intake: 1 protein shake per day 11 oz, at least 50 oz water, 8 oz decaf coffee with sugar free hazelnut creamer and miralax  (64 oz+) Estimated total protein intake: ~70 g  Medications: see list; only thyroid medication Supplementation: taking  CBG monitoring: 1 x day  Average CBG per patient:  90-100 mg/dl Last patient reported A1c: not sure   Using straws: No Drinking while eating: Not usually  Hair loss: No Carbonated beverages: No N/V/D/C:  constipation getting better and taking benefiber every day Dumping syndrome: 1x  Recent physical activity:  Walking outside for 45 minutes at least 3 days a week  Progress Towards Goal(s):  In progress.  Handouts given during visit include:  none   Nutritional Diagnosis:  [place]-3.3 Overweight/obesity related to past poor dietary habits and physical inactivity as evidenced by patient w/ recent RYGB surgery following dietary guidelines for continued weight loss.    Intervention:  Nutrition education/diet reinforcement Goals:  Follow Phase 3B: High Protein + Non-Starchy Vegetables  Eat 3-6 small meals/snacks, every 3-5 hrs  Continue to have protein foods in 60-80g range  Increase fluid intake to 64oz +  Avoid drinking 15 minutes before, during and 30 minutes after eating  Aim for >30 min of physical activity daily  If want to try fruit, have some apples, grapes, orange, berries etc after you have protein  Try Parmesan Crisps ([employer])  Yoplait [place] 100, Chobani simply 100  Increase weight training to at least 2 days a week  Teaching Method Utilized:  Visual Auditory Hands on  Barriers to learning/adherence to lifestyle change: none  Demonstrated degree of understanding via:  Teach Back   Monitoring/Evaluation:  Dietary intake, exercise, and body weight. Follow up in 2 months for 11 month post-op visit.

## 2015-10-02 ENCOUNTER — Telehealth: Payer: Self-pay | Admitting: *Deleted

## 2015-10-02 MED ORDER — ALPRAZOLAM 0.25 MG PO TABS: 0.2500 mg | ORAL_TABLET | Freq: Every evening | ORAL | Status: DC | PRN

## 2015-10-02 NOTE — Telephone Encounter (Signed)
Notified pt rx has been sent to [facility].../lmb 

## 2015-10-02 NOTE — Telephone Encounter (Signed)
Left msg on triage requesting refills on her alprazolam../lmb 

## 2015-10-02 NOTE — Telephone Encounter (Signed)
Forwarded to Lucy/Triage

## 2015-10-02 NOTE — Telephone Encounter (Signed)
Done hardcopy to Corinne  

## 2015-10-02 NOTE — Telephone Encounter (Signed)
I was not able to review the chart in extreme detail but:  Unfortunately as this is a courtesy prescription,  not prescribed by me or other provider in this office before, and the dose was unknown to start with, this is why she has the dosing she mentioned  I do not feel comfortable with the higher dosing at this time, as I have not prescribed this for her in the past and have no way to verify her previous dosing by her psychiatrist  I would encourage her to f/u with psychiatry if she needs further treatment

## 2015-10-02 NOTE — Telephone Encounter (Signed)
Pt called back to verify which milligram was sent inform her md has alprazolam 0.25 mg on file she stated that her psychiatrist (Dr. Buddy Duty)  increase her to 1 mg & she been taking the 1 mg since 2012 she is wanting the 1 mg refill...Johny Chess

## 2015-10-13 DIAGNOSIS — Z6833 Body mass index (BMI) 33.0-33.9, adult: Secondary | ICD-10-CM | POA: Diagnosis not present

## 2015-10-13 DIAGNOSIS — Z124 Encounter for screening for malignant neoplasm of cervix: Secondary | ICD-10-CM | POA: Diagnosis not present

## 2015-10-13 DIAGNOSIS — Z1231 Encounter for screening mammogram for malignant neoplasm of breast: Secondary | ICD-10-CM | POA: Diagnosis not present

## 2015-10-13 DIAGNOSIS — N760 Acute vaginitis: Secondary | ICD-10-CM | POA: Diagnosis not present

## 2015-10-26 DIAGNOSIS — M25561 Pain in right knee: Secondary | ICD-10-CM | POA: Diagnosis not present

## 2015-10-26 DIAGNOSIS — M25562 Pain in left knee: Secondary | ICD-10-CM | POA: Diagnosis not present

## 2015-10-26 DIAGNOSIS — M170 Bilateral primary osteoarthritis of knee: Secondary | ICD-10-CM | POA: Diagnosis not present

## 2015-11-06 ENCOUNTER — Telehealth: Payer: Self-pay

## 2015-11-06 NOTE — Telephone Encounter (Signed)
Spoke to pt and pt declined the flu vaccine.   

## 2015-11-11 ENCOUNTER — Encounter: Payer: Self-pay | Admitting: Dietician

## 2015-11-11 ENCOUNTER — Encounter: Payer: Medicare Other | Attending: Internal Medicine | Admitting: Dietician

## 2015-11-11 DIAGNOSIS — Z713 Dietary counseling and surveillance: Secondary | ICD-10-CM | POA: Diagnosis not present

## 2015-11-11 DIAGNOSIS — E669 Obesity, unspecified: Secondary | ICD-10-CM | POA: Insufficient documentation

## 2015-11-11 DIAGNOSIS — Z6841 Body Mass Index (BMI) 40.0 and over, adult: Secondary | ICD-10-CM | POA: Diagnosis not present

## 2015-11-11 NOTE — Patient Instructions (Signed)
Goals:  Follow Phase 3B: High Protein + Non-Starchy Vegetables  Eat 3-6 small meals/snacks, every 3-5 hrs  Continue to have protein foods in 60-80g range  Increase fluid intake to 64oz +  Avoid drinking 15 minutes before, during and 30 minutes after eating  Aim for >30 min of physical activity daily  If want to try fruit, have some apples, grapes, orange, berries etc after you have protein  Try Parmesan Crisps [place] [employer])  Yoplait [place] 100, Chobani simply 100  Increase weight training to at least 2 days a week

## 2015-11-11 NOTE — Progress Notes (Signed)
Follow-up visit:  11 Months Post-Operative RYGB Surgery  Medical Nutrition Therapy:  Appt start time: 815 end time: 845  Primary concerns today: Post-operative Bariatric Surgery Nutrition Management. Returns with a 2 lb weight loss. Has stopped eating popcorn and pimento cheese. Plans to try [employer] chips for a crunch. Has noticed that her appetite has increased and this makes her nervous. Has been working on toning more and has been taking a "strength and toning" class and has also learned some exercises to do at home. Would really like to lose another 10 pounds and reach 180 lbs.   Surgery date: 12/08/14 Surgery type: RYGB Start weight at [facility]: 262.5 lbs on 04/07/14 Weight today: 191.5 lbs Weight change: 2 lbs loss Total weight loss: 70.5 lbs  TANITA  BODY COMP RESULTS  11/24/14 12/30/14 02/11/15 03/16/15 04/28/15 06/09/15 07/22/15 09/16/15 11/11/15   BMI (kg/m^2) 43.9 41.2 38.5 36.9 34.9 33.1 32.7 33.2 32.9   Fat Mass (lbs) 139 124.0 109.5 103.0 91.0 84.0 85 87.5 90   Fat Free Mass (lbs) 116.5 116.0 115.0 112.0 112.5 109.0 105.5 106 101.5   Total Body Water (lbs) 85.5 85.0 84.0 82.0 82.5 80.0 77 77.5 74.5    Preferred Learning Style:   No preference indicated   Learning Readiness:   Ready  24-hr recall: B (AM): scrambled egg with cheese and yogurt OR Premier protein shake and yogurt, coffee with SF creamer (22-32 g) Snk (AM):  L (PM): 2-3 oz chicken on salad with veggies and cheese and Ranch or [place] or balsamic dressing (21 g) Snk (PM):  D (PM): 2-3 oz baked chicken/fish/shrimp with green beans, salad, or beans OR chili with ground [place] (14-21 g) Snk (PM): [place] yogurt  Fluid intake: 1 protein shake per day 11 oz, at least 50 oz water, 8 oz decaf coffee with sugar free hazelnut creamer and miralax  (64 oz+) Estimated total protein intake: ~70 g  Medications: see list; only thyroid medication Supplementation: taking  CBG monitoring: 1 x day  Average CBG per patient:  90-100  mg/dl Last patient reported A1c: not sure   Using straws: No Drinking while eating: Not usually  Hair loss: No Carbonated beverages: No N/V/D/C: constipation getting better and taking benefiber every day Dumping syndrome: 1x at Christmas  Recent physical activity:  Walking outside for 45 minutes at least 3 days a week  Progress Towards Goal(s):  In progress.  Handouts given during visit include:  none   Nutritional Diagnosis:  [place]-3.3 Overweight/obesity related to past poor dietary habits and physical inactivity as evidenced by patient w/ recent RYGB surgery following dietary guidelines for continued weight loss.    Intervention:  Nutrition education/diet reinforcement Goals:  Follow Phase 3B: High Protein + Non-Starchy Vegetables  Eat 3-6 small meals/snacks, every 3-5 hrs  Continue to have protein foods in 60-80g range  Increase fluid intake to 64oz +  Avoid drinking 15 minutes before, during and 30 minutes after eating  Aim for >30 min of physical activity daily  If want to try fruit, have some apples, grapes, orange, berries etc after you have protein  Try Parmesan Crisps ([employer])  Yoplait [place] 100, Chobani simply 100  Increase weight training to at least 2 days a week  Teaching Method Utilized:  Visual Auditory Hands on  Barriers to learning/adherence to lifestyle change: none  Demonstrated degree of understanding via:  Teach Back   Monitoring/Evaluation:  Dietary intake, exercise, and body weight. Follow up in 2 months for 13 month post-op  visit.

## 2015-12-31 ENCOUNTER — Telehealth: Payer: Self-pay | Admitting: Internal Medicine

## 2015-12-31 NOTE — Telephone Encounter (Signed)
Left message for patient to call back  

## 2015-12-31 NOTE — Telephone Encounter (Signed)
Rectal bleeding she is at the beach and will not make it back by tomorrow.  She will come in and see Alonza Bogus, PA on 01/06/16 10:00

## 2016-01-05 ENCOUNTER — Ambulatory Visit: Payer: Self-pay | Admitting: Gastroenterology

## 2016-01-06 ENCOUNTER — Ambulatory Visit (INDEPENDENT_AMBULATORY_CARE_PROVIDER_SITE_OTHER): Payer: Medicare Other | Admitting: Gastroenterology

## 2016-01-06 ENCOUNTER — Encounter: Payer: Self-pay | Admitting: Gastroenterology

## 2016-01-06 VITALS — BP 116/64 | HR 63 | Ht 64.0 in | Wt 193.0 lb

## 2016-01-06 DIAGNOSIS — K602 Anal fissure, unspecified: Secondary | ICD-10-CM | POA: Insufficient documentation

## 2016-01-06 DIAGNOSIS — K625 Hemorrhage of anus and rectum: Secondary | ICD-10-CM

## 2016-01-06 DIAGNOSIS — K5909 Other constipation: Secondary | ICD-10-CM | POA: Diagnosis not present

## 2016-01-06 DIAGNOSIS — K6289 Other specified diseases of anus and rectum: Secondary | ICD-10-CM | POA: Diagnosis not present

## 2016-01-06 DIAGNOSIS — K5900 Constipation, unspecified: Secondary | ICD-10-CM | POA: Insufficient documentation

## 2016-01-06 MED ORDER — DILTIAZEM GEL 2 %: 1.0000 "application " | Freq: Two times a day (BID) | CUTANEOUS | Status: DC

## 2016-01-06 NOTE — Patient Instructions (Signed)
We sent a prescription for diltiazem 2 %  Gel  To [facility], [facility], [place], [place].  Take Miralax daily.
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
## 2016-01-12 ENCOUNTER — Ambulatory Visit: Payer: Self-pay | Admitting: Dietician

## 2016-01-24 NOTE — Progress Notes (Signed)
Agree with [name]'s management.  Elizabeth Hawkins E. Bartholomew Ramesh, MD, FACG  

## 2016-04-28 ENCOUNTER — Other Ambulatory Visit: Payer: Self-pay | Admitting: Obstetrics and Gynecology

## 2016-04-28 DIAGNOSIS — N644 Mastodynia: Secondary | ICD-10-CM | POA: Diagnosis not present

## 2016-04-28 DIAGNOSIS — N6320 Unspecified lump in the left breast, unspecified quadrant: Secondary | ICD-10-CM

## 2016-05-03 ENCOUNTER — Ambulatory Visit
Admission: RE | Admit: 2016-05-03 | Discharge: 2016-05-03 | Disposition: A | Discharge status: Home / Self Care | Payer: Medicare Other | Source: Ambulatory Visit | Attending: Obstetrics and Gynecology | Admitting: Obstetrics and Gynecology

## 2016-05-03 DIAGNOSIS — N6320 Unspecified lump in the left breast, unspecified quadrant: Secondary | ICD-10-CM

## 2016-05-03 DIAGNOSIS — N63 Unspecified lump in breast: Secondary | ICD-10-CM | POA: Diagnosis not present

## 2016-05-18 DIAGNOSIS — N6452 Nipple discharge: Secondary | ICD-10-CM | POA: Diagnosis not present

## 2016-05-19 ENCOUNTER — Other Ambulatory Visit: Payer: Self-pay | Admitting: General Surgery

## 2016-05-19 DIAGNOSIS — N6452 Nipple discharge: Secondary | ICD-10-CM

## 2016-06-01 ENCOUNTER — Ambulatory Visit
Admission: RE | Admit: 2016-06-01 | Discharge: 2016-06-01 | Disposition: A | Discharge status: Home / Self Care | Payer: Medicare Other | Source: Ambulatory Visit | Attending: General Surgery | Admitting: General Surgery

## 2016-06-01 DIAGNOSIS — N6452 Nipple discharge: Secondary | ICD-10-CM

## 2016-06-01 DIAGNOSIS — N6322 Unspecified lump in the left breast, upper inner quadrant: Secondary | ICD-10-CM | POA: Diagnosis not present

## 2016-06-01 MED ORDER — GADOBENATE DIMEGLUMINE 529 MG/ML IV SOLN
18.0000 mL | Freq: Once | INTRAVENOUS | Status: AC | PRN
  Administered 2016-06-01: 18 mL via INTRAVENOUS

## 2016-06-09 ENCOUNTER — Other Ambulatory Visit: Payer: Self-pay | Admitting: General Surgery

## 2016-06-09 DIAGNOSIS — R928 Other abnormal and inconclusive findings on diagnostic imaging of breast: Secondary | ICD-10-CM

## 2016-06-13 ENCOUNTER — Other Ambulatory Visit: Payer: Self-pay | Admitting: General Surgery

## 2016-06-13 DIAGNOSIS — R928 Other abnormal and inconclusive findings on diagnostic imaging of breast: Secondary | ICD-10-CM

## 2016-06-14 ENCOUNTER — Ambulatory Visit
Admission: RE | Admit: 2016-06-14 | Discharge: 2016-06-14 | Disposition: A | Discharge status: Home / Self Care | Payer: Medicare Other | Source: Ambulatory Visit | Attending: General Surgery | Admitting: General Surgery

## 2016-06-14 ENCOUNTER — Other Ambulatory Visit: Payer: Self-pay | Admitting: Diagnostic Radiology

## 2016-06-14 DIAGNOSIS — N6320 Unspecified lump in the left breast, unspecified quadrant: Secondary | ICD-10-CM | POA: Diagnosis not present

## 2016-06-14 DIAGNOSIS — R928 Other abnormal and inconclusive findings on diagnostic imaging of breast: Secondary | ICD-10-CM

## 2016-06-14 DIAGNOSIS — C50919 Malignant neoplasm of unspecified site of unspecified female breast: Secondary | ICD-10-CM

## 2016-06-14 DIAGNOSIS — N6322 Unspecified lump in the left breast, upper inner quadrant: Secondary | ICD-10-CM | POA: Diagnosis not present

## 2016-06-14 DIAGNOSIS — N6321 Unspecified lump in the left breast, upper outer quadrant: Secondary | ICD-10-CM | POA: Diagnosis not present

## 2016-06-14 DIAGNOSIS — N6311 Unspecified lump in the right breast, upper outer quadrant: Secondary | ICD-10-CM | POA: Diagnosis not present

## 2016-06-14 DIAGNOSIS — N6012 Diffuse cystic mastopathy of left breast: Secondary | ICD-10-CM | POA: Diagnosis not present

## 2016-06-14 DIAGNOSIS — D0511 Intraductal carcinoma in situ of right breast: Secondary | ICD-10-CM | POA: Diagnosis not present

## 2016-06-14 DIAGNOSIS — N6310 Unspecified lump in the right breast, unspecified quadrant: Secondary | ICD-10-CM | POA: Diagnosis not present

## 2016-06-14 DIAGNOSIS — D242 Benign neoplasm of left breast: Secondary | ICD-10-CM | POA: Diagnosis not present

## 2016-06-14 HISTORY — DX: Malignant neoplasm of unspecified site of unspecified female breast: C50.919

## 2016-06-14 MED ORDER — GADOBENATE DIMEGLUMINE 529 MG/ML IV SOLN
18.0000 mL | Freq: Once | INTRAVENOUS | Status: AC | PRN
  Administered 2016-06-14: 18 mL via INTRAVENOUS

## 2016-06-16 DIAGNOSIS — H04123 Dry eye syndrome of bilateral lacrimal glands: Secondary | ICD-10-CM | POA: Diagnosis not present

## 2016-06-16 DIAGNOSIS — E119 Type 2 diabetes mellitus without complications: Secondary | ICD-10-CM | POA: Diagnosis not present

## 2016-06-16 LAB — HM DIABETES EYE EXAM

## 2016-06-22 ENCOUNTER — Encounter: Payer: Self-pay | Admitting: Genetic Counselor

## 2016-06-28 DIAGNOSIS — D0511 Intraductal carcinoma in situ of right breast: Secondary | ICD-10-CM | POA: Diagnosis not present

## 2016-07-05 ENCOUNTER — Encounter: Payer: Self-pay | Admitting: Hematology

## 2016-07-05 ENCOUNTER — Telehealth: Payer: Self-pay | Admitting: Hematology

## 2016-07-05 ENCOUNTER — Encounter: Payer: Self-pay | Admitting: Radiation Oncology

## 2016-07-05 NOTE — Telephone Encounter (Signed)
Cld pt to schedule an appt. Appt orginally scheduled with Gudena, but pt's daughter wants to come to the appt and 12/4 was not a good day for her. Appt scheduled w/Feng on 12/ at 11am. Pt aware to arrive 15-30 minutes early. Demographics verified. Location given.

## 2016-07-11 ENCOUNTER — Ambulatory Visit: Payer: Self-pay | Admitting: Hematology and Oncology

## 2016-07-12 ENCOUNTER — Ambulatory Visit: Payer: Medicare Other | Attending: General Surgery | Admitting: Physical Therapy

## 2016-07-12 DIAGNOSIS — M6281 Muscle weakness (generalized): Secondary | ICD-10-CM

## 2016-07-12 DIAGNOSIS — R293 Abnormal posture: Secondary | ICD-10-CM | POA: Insufficient documentation
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
## 2016-07-15 ENCOUNTER — Ambulatory Visit (HOSPITAL_BASED_OUTPATIENT_CLINIC_OR_DEPARTMENT_OTHER): Payer: Medicare Other | Admitting: Hematology

## 2016-07-15 ENCOUNTER — Encounter: Payer: Self-pay | Admitting: Hematology

## 2016-07-15 ENCOUNTER — Telehealth: Payer: Self-pay

## 2016-07-15 VITALS — BP 121/73 | HR 65 | Temp 98.4°F | Resp 18 | Ht 64.0 in | Wt 204.4 lb

## 2016-07-15 DIAGNOSIS — D509 Iron deficiency anemia, unspecified: Secondary | ICD-10-CM

## 2016-07-15 DIAGNOSIS — D0511 Intraductal carcinoma in situ of right breast: Secondary | ICD-10-CM | POA: Diagnosis not present

## 2016-07-15 DIAGNOSIS — D649 Anemia, unspecified: Secondary | ICD-10-CM

## 2016-07-15 NOTE — Telephone Encounter (Signed)
Yes to DM, though now diet controlled.  Not sure how long to the question you asked.

## 2016-07-15 NOTE — Telephone Encounter (Signed)
I need some confirmation regarding pt dx for DM.   Does she still have a dx for DM since her wt loss surgery? How long did she have DM before treatment was discontinued? Let me know if I need to clarify.

## 2016-07-18 ENCOUNTER — Other Ambulatory Visit (HOSPITAL_BASED_OUTPATIENT_CLINIC_OR_DEPARTMENT_OTHER): Payer: Medicare Other

## 2016-07-18 ENCOUNTER — Telehealth: Payer: Self-pay | Admitting: *Deleted

## 2016-07-18 ENCOUNTER — Encounter: Payer: Self-pay | Admitting: *Deleted

## 2016-07-18 ENCOUNTER — Telehealth: Payer: Self-pay | Admitting: Hematology

## 2016-07-18 ENCOUNTER — Encounter: Payer: Self-pay | Admitting: Radiation Oncology

## 2016-07-18 DIAGNOSIS — D649 Anemia, unspecified: Secondary | ICD-10-CM | POA: Diagnosis not present

## 2016-07-18 DIAGNOSIS — D0511 Intraductal carcinoma in situ of right breast: Secondary | ICD-10-CM | POA: Diagnosis not present

## 2016-07-18 LAB — COMPREHENSIVE METABOLIC PANEL
ALBUMIN: 3.3 g/dL — AB (ref 3.5–5.0)
ALK PHOS: 55 U/L (ref 40–150)
ALT: 25 U/L (ref 0–55)
AST: 23 U/L (ref 5–34)
Anion Gap: 8 mEq/L (ref 3–11)
BUN: 15.6 mg/dL (ref 7.0–26.0)
CALCIUM: 9 mg/dL (ref 8.4–10.4)
CHLORIDE: 110 meq/L — AB (ref 98–109)
CO2: 23 mEq/L (ref 22–29)
Creatinine: 0.8 mg/dL (ref 0.6–1.1)
EGFR: >90 mL/min/{1.73_m2} (ref >90)
Glucose: 84 mg/dl (ref 70–140)
POTASSIUM: 4 meq/L (ref 3.5–5.1)
Sodium: 140 mEq/L (ref 136–145)
Total Bilirubin: 0.33 mg/dL (ref 0.20–1.20)
Total Protein: 6.9 g/dL (ref 6.4–8.3)

## 2016-07-18 LAB — CBC & DIFF AND RETIC
BASO%: 0.4 % (ref 0.0–2.0)
BASOS ABS: 0 10*3/uL (ref 0.0–0.1)
EOS%: 2.3 % (ref 0.0–7.0)
Eosinophils Absolute: 0.1 10*3/uL (ref 0.0–0.5)
HEMATOCRIT: 34.7 % — AB (ref 34.8–46.6)
HEMOGLOBIN: 10.8 g/dL — AB (ref 11.6–15.9)
Immature Retic Fract: 3.9 % (ref 1.60–10.00)
LYMPH%: 49.7 % (ref 14.0–49.7)
MCH: 23 pg — AB (ref 25.1–34.0)
MCHC: 31.1 g/dL — AB (ref 31.5–36.0)
MCV: 74 fL — AB (ref 79.5–101.0)
MONO#: 0.5 10*3/uL (ref 0.1–0.9)
MONO%: 9.9 % (ref 0.0–14.0)
NEUT#: 1.8 10*3/uL (ref 1.5–6.5)
NEUT%: 37.7 % — ABNORMAL LOW (ref 38.4–76.8)
PLATELETS: 183 10*3/uL (ref 145–400)
RBC: 4.69 10*6/uL (ref 3.70–5.45)
RDW: 14.5 % (ref 11.2–14.5)
RETIC %: 1.54 % (ref 0.70–2.10)
Retic Ct Abs: 72.23 10*3/uL (ref 33.70–90.70)
WBC: 4.8 10*3/uL (ref 3.9–10.3)
lymph#: 2.4 10*3/uL (ref 0.9–3.3)

## 2016-07-18 LAB — IRON AND TIBC
%SAT: 28 % (ref 21–57)
IRON: 102 ug/dL (ref 41–142)
TIBC: 359 ug/dL (ref 236–444)
UIBC: 257 ug/dL (ref 120–384)

## 2016-07-18 LAB — FERRITIN: FERRITIN: 73 ng/mL (ref 9–269)

## 2016-07-18 NOTE — Telephone Encounter (Signed)
Appointment scheduled per 12/8 LOS.

## 2016-07-18 NOTE — Telephone Encounter (Signed)
  Oncology Nurse Navigator Documentation  Navigator Location: [facility] (07/18/16 1400) Referral date to RadOnc/MedOnc: 07/05/16 (07/18/16 1400) )Navigator Encounter Type: Introductory phone call (07/18/16 1400)   Abnormal Finding Date: 05/03/16 (07/18/16 1400) Confirmed Diagnosis Date: 06/14/16 (07/18/16 1400)   Genetic Counseling Date:  (None) (07/18/16 1400) Genetic Counseling Type:  (None) (07/18/16 1400) Plastic Surgery Consult Date: 07/18/16 (07/18/16 1400) Multidisiplinary Clinic Date:  (None) (07/18/16 1400) Multidisiplinary Clinic Type: Breast (07/18/16 1400)   Patient Visit Type: MedOnc;Initial (07/18/16 1400) Treatment Phase: Pre-Tx/Tx Discussion (07/18/16 1400) Barriers/Navigation Needs: Coordination of Care (07/18/16 1400)   Interventions: Coordination of Care (07/18/16 1400)  Gave navigation resources and contact information. Pt relate she had decided on having bilateral mastectomies with reconstruction. Request to have radiation appt cancelled. Informed pt that I would assist with that. Denies further needs or questions at this time. Encourage pt to call with concerns. Received verbal understanding.          Acuity: Level 2 (07/18/16 1400)         Time Spent with Patient: 30 (07/18/16 1400)

## 2016-07-19 ENCOUNTER — Ambulatory Visit: Payer: Self-pay | Admitting: General Surgery

## 2016-07-19 DIAGNOSIS — D0511 Intraductal carcinoma in situ of right breast: Secondary | ICD-10-CM

## 2016-07-19 LAB — FOLATE RBC
FOLATE, HEMOLYSATE: 241.4 ng/mL
Folate, RBC: 690 ng/mL (ref >498)
Hematocrit: 35 % (ref 34.0–46.6)

## 2016-07-19 LAB — VITAMIN B12: VITAMIN B 12: 640 pg/mL (ref 232–1245)

## 2016-07-20 ENCOUNTER — Ambulatory Visit: Payer: Medicare Other

## 2016-07-20 ENCOUNTER — Ambulatory Visit: Admission: RE | Admit: 2016-07-20 | Payer: Medicare Other | Source: Ambulatory Visit | Admitting: Radiation Oncology

## 2016-07-20 HISTORY — DX: Malignant neoplasm of unspecified site of unspecified female breast: C50.919

## 2016-07-21 ENCOUNTER — Telehealth: Payer: Self-pay | Admitting: *Deleted

## 2016-07-21 NOTE — Telephone Encounter (Signed)
Pt called wanting to know lab results.  Stated she was informed by md at last office visit to call for lab results before pt can move forward with mastectomy procedure. Pt's   Phone      [phone].

## 2016-07-21 NOTE — Telephone Encounter (Signed)
I have called pt and discussed her anemia workup lab results. Her anemia has improved, hemoglobin 10.8, iron study, [id number], folic acid level were normal. I recommend her to have alpha thalassemia gene mutation test, her previous hemoglobin electrophoresis was normal. I suggest her to have the lab test after her breast surgery, and I'll see her 1 week after her lab tests.  I'll inform Dr. Excell Seltzer about her lab test results, and she can schedule her double mastectomy.   Truitt Merle MD

## 2016-07-28 ENCOUNTER — Ambulatory Visit: Payer: Self-pay | Admitting: General Surgery

## 2016-08-04 ENCOUNTER — Other Ambulatory Visit: Payer: Self-pay | Admitting: Internal Medicine

## 2016-08-04 NOTE — Telephone Encounter (Signed)
Routing to dr john, please advise, thanks 

## 2016-08-05 ENCOUNTER — Other Ambulatory Visit: Payer: Self-pay | Admitting: Internal Medicine

## 2016-08-05 MED ORDER — ALPRAZOLAM 0.25 MG PO TABS: 0.2500 mg | ORAL_TABLET | Freq: Every evening | ORAL | 0 refills | Status: DC | PRN

## 2016-08-05 NOTE — Telephone Encounter (Signed)
Done hardcopy to Corinne - 1 mo xanax  Further refills depend on ROV, pt scheduled for jan 2

## 2016-08-05 NOTE — Telephone Encounter (Signed)
Notes received and abstracted.  

## 2016-08-05 NOTE — Telephone Encounter (Signed)
Called Dr. Venetia Maxon and they are faxing eye exam notes.

## 2016-08-05 NOTE — Telephone Encounter (Signed)
faxed

## 2016-08-05 NOTE — Telephone Encounter (Signed)
Pt has scheduled her CPE for 08/09/2016.

## 2016-08-08 HISTORY — PX: MASTECTOMY: SHX3

## 2016-08-09 ENCOUNTER — Other Ambulatory Visit (INDEPENDENT_AMBULATORY_CARE_PROVIDER_SITE_OTHER): Payer: Medicare Other

## 2016-08-09 ENCOUNTER — Ambulatory Visit (INDEPENDENT_AMBULATORY_CARE_PROVIDER_SITE_OTHER): Payer: Medicare Other | Admitting: Internal Medicine

## 2016-08-09 ENCOUNTER — Telehealth: Payer: Self-pay

## 2016-08-09 ENCOUNTER — Encounter: Payer: Self-pay | Admitting: Internal Medicine

## 2016-08-09 VITALS — BP 130/68 | HR 80 | Temp 98.2°F | Resp 20 | Wt 200.0 lb

## 2016-08-09 DIAGNOSIS — E785 Hyperlipidemia, unspecified: Secondary | ICD-10-CM

## 2016-08-09 DIAGNOSIS — E039 Hypothyroidism, unspecified: Secondary | ICD-10-CM

## 2016-08-09 DIAGNOSIS — Z1159 Encounter for screening for other viral diseases: Secondary | ICD-10-CM

## 2016-08-09 DIAGNOSIS — I10 Essential (primary) hypertension: Secondary | ICD-10-CM | POA: Diagnosis not present

## 2016-08-09 DIAGNOSIS — R0602 Shortness of breath: Secondary | ICD-10-CM

## 2016-08-09 DIAGNOSIS — E119 Type 2 diabetes mellitus without complications: Secondary | ICD-10-CM

## 2016-08-09 DIAGNOSIS — R059 Cough, unspecified: Secondary | ICD-10-CM

## 2016-08-09 DIAGNOSIS — Z23 Encounter for immunization: Secondary | ICD-10-CM

## 2016-08-09 DIAGNOSIS — J069 Acute upper respiratory infection, unspecified: Secondary | ICD-10-CM | POA: Diagnosis not present

## 2016-08-09 DIAGNOSIS — R05 Cough: Secondary | ICD-10-CM

## 2016-08-09 LAB — HEPATIC FUNCTION PANEL
ALBUMIN: 4.1 g/dL (ref 3.5–5.2)
ALT: 20 U/L (ref 0–35)
AST: 21 U/L (ref 0–37)
Alkaline Phosphatase: 51 U/L (ref 39–117)
BILIRUBIN TOTAL: 0.3 mg/dL (ref 0.2–1.2)
Bilirubin, Direct: 0.1 mg/dL (ref 0.0–0.3)
TOTAL PROTEIN: 7.5 g/dL (ref 6.0–8.3)

## 2016-08-09 LAB — BASIC METABOLIC PANEL
BUN: 14 mg/dL (ref 6–23)
CHLORIDE: 103 meq/L (ref 96–112)
CO2: 30 meq/L (ref 19–32)
CREATININE: 0.84 mg/dL (ref 0.40–1.20)
Calcium: 9.3 mg/dL (ref 8.4–10.5)
GFR: 89.1 mL/min (ref >60.00)
GLUCOSE: 84 mg/dL (ref 70–99)
Potassium: 3.9 mEq/L (ref 3.5–5.1)
Sodium: 137 mEq/L (ref 135–145)

## 2016-08-09 LAB — CBC WITH DIFFERENTIAL/PLATELET
BASOS ABS: 0.1 10*3/uL (ref 0.0–0.1)
Basophils Relative: 0.8 % (ref 0.0–3.0)
Eosinophils Absolute: 0.1 10*3/uL (ref 0.0–0.7)
Eosinophils Relative: 2 % (ref 0.0–5.0)
HEMATOCRIT: 37.5 % (ref 36.0–46.0)
HEMOGLOBIN: 12.1 g/dL (ref 12.0–15.0)
LYMPHS PCT: 35.1 % (ref 12.0–46.0)
Lymphs Abs: 2.5 10*3/uL (ref 0.7–4.0)
MCHC: 32.2 g/dL (ref 30.0–36.0)
MCV: 73.4 fl — ABNORMAL LOW (ref 78.0–100.0)
MONO ABS: 0.8 10*3/uL (ref 0.1–1.0)
Monocytes Relative: 11.3 % (ref 3.0–12.0)
Neutro Abs: 3.7 10*3/uL (ref 1.4–7.7)
Neutrophils Relative %: 50.8 % (ref 43.0–77.0)
Platelets: 238 10*3/uL (ref 150.0–400.0)
RBC: 5.11 Mil/uL (ref 3.87–5.11)
RDW: 14.4 % (ref 11.5–15.5)
WBC: 7.2 10*3/uL (ref 4.0–10.5)

## 2016-08-09 LAB — MICROALBUMIN / CREATININE URINE RATIO
Creatinine,U: 90.3 mg/dL
MICROALB/CREAT RATIO: 1 mg/g (ref 0.0–30.0)
Microalb, Ur: 0.9 mg/dL (ref 0.0–1.9)

## 2016-08-09 LAB — LIPID PANEL
CHOL/HDL RATIO: 3
CHOLESTEROL: 141 mg/dL (ref 0–200)
HDL: 48.4 mg/dL (ref >39.00)
LDL Cholesterol: 65 mg/dL (ref 0–99)
NonHDL: 92.84
TRIGLYCERIDES: 139 mg/dL (ref 0.0–149.0)
VLDL: 27.8 mg/dL (ref 0.0–40.0)

## 2016-08-09 LAB — HEMOGLOBIN A1C: Hgb A1c MFr Bld: 5.3 % (ref 4.6–6.5)

## 2016-08-09 LAB — URINALYSIS, ROUTINE W REFLEX MICROSCOPIC
Bilirubin Urine: NEGATIVE
HGB URINE DIPSTICK: NEGATIVE
KETONES UR: NEGATIVE
Leukocytes, UA: NEGATIVE
NITRITE: NEGATIVE
RBC / HPF: NONE SEEN (ref 0–?)
Specific Gravity, Urine: 1.01 (ref 1.000–1.030)
TOTAL PROTEIN, URINE-UPE24: NEGATIVE
URINE GLUCOSE: NEGATIVE
UROBILINOGEN UA: 0.2 (ref 0.0–1.0)
WBC UA: NONE SEEN (ref 0–?)
pH: 5.5 (ref 5.0–8.0)

## 2016-08-09 LAB — HEPATITIS C ANTIBODY: HCV Ab: NEGATIVE

## 2016-08-09 LAB — TSH: TSH: 3.01 u[IU]/mL (ref 0.35–4.50)

## 2016-08-09 MED ORDER — AZITHROMYCIN 250 MG PO TABS: ORAL_TABLET | ORAL | 1 refills | Status: DC

## 2016-08-09 MED ORDER — FLUCONAZOLE 150 MG PO TABS: ORAL_TABLET | ORAL | 1 refills | Status: DC

## 2016-08-09 MED ORDER — LEVOTHYROXINE SODIUM 50 MCG PO TABS: ORAL_TABLET | ORAL | 2 refills | Status: DC

## 2016-08-09 MED ORDER — ASPIRIN EC 81 MG PO TBEC: 81.0000 mg | DELAYED_RELEASE_TABLET | Freq: Every day | ORAL | 3 refills | Status: DC

## 2016-08-09 MED ORDER — ALBUTEROL SULFATE HFA 108 (90 BASE) MCG/ACT IN AERS: 2.0000 | INHALATION_SPRAY | Freq: Four times a day (QID) | RESPIRATORY_TRACT | 2 refills | Status: DC | PRN

## 2016-08-09 MED ORDER — ALPRAZOLAM 0.25 MG PO TABS: 0.2500 mg | ORAL_TABLET | Freq: Every evening | ORAL | 5 refills | Status: DC | PRN

## 2016-08-09 NOTE — Assessment & Plan Note (Signed)
stable overall by history and exam, recent data reviewed with pt, and pt to continue medical treatment as before,  to f/u any worsening symptoms or concerns Lab Results  Component Value Date   HGBA1C 6.0 03/21/2014

## 2016-08-09 NOTE — Assessment & Plan Note (Signed)
stable overall by history and exam, recent data reviewed with pt, and pt to continue medical treatment as before,  to f/u any worsening symptoms or concerns Lab Results  Component Value Date   LDLCALC 111 (H) 03/21/2014

## 2016-08-09 NOTE — Patient Instructions (Addendum)
You had the Td tetanus shot today  Please also start Aspirin 81 mg - 1 per day - (coated only) to help reduce risk of heart disease and stroke  Please take all new medication as prescribed - the antibiotic, and the diflucan if needed  Please continue all other medications as before, and refills have been done if requested.  Please have the pharmacy call with any other refills you may need.  Please continue your efforts at being more active, low cholesterol diet, and weight control.  You are otherwise up to date with prevention measures today.  Please keep your appointments with your specialists as you may have planned  Please go to the LAB in the Basement (turn left off the elevator) for the tests to be done today  You will be contacted by phone if any changes need to be made immediately.  Otherwise, you will receive a letter about your results with an explanation, but please check with MyChart first.  Please remember to sign up for MyChart if you have not done so, as this will be important to you in the future with finding out test results, communicating by private email, and scheduling acute appointments online when needed.  If you have Medicare related insurance (such as traditoinal Medicare, Blue [employer] or [employer], or similar), Please make an appointment at the Scheduling desk with Maudie Mercury, the [employer], for your Wellness Visit in this office, which is a benefit with your insurance.  Emmit Alexanders with your surgury  Please return in 6 months, or sooner if needed

## 2016-08-09 NOTE — Progress Notes (Signed)
Pre visit review using our clinic review tool, if applicable. No additional management support is needed unless otherwise documented below in the visit note. 
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
## 2016-08-09 NOTE — Assessment & Plan Note (Signed)
stable overall by history and exam, recent data reviewed with pt, and pt to continue medical treatment as before,  to f/u any worsening symptoms or concerns BP Readings from Last 3 Encounters:  08/09/16 130/68  07/15/16 121/73  01/06/16 116/64

## 2016-08-09 NOTE — Assessment & Plan Note (Addendum)
Mild to mod, for antibx course,  to f/u any worsening symptoms or concerns  Note:  Total time for pt hx, exam, review of record with pt in the room, determination of diagnoses and plan for further eval and tx is > 40 min, with over 50% spent in coordination and counseling of patient 

## 2016-08-09 NOTE — Assessment & Plan Note (Signed)
Lab Results  Component Value Date   TSH 3.08 03/21/2014   stable overall by history and exam, recent data reviewed with pt, and pt to continue medical treatment as before,  to f/u any worsening symptoms or concerns

## 2016-08-09 NOTE — Telephone Encounter (Signed)
Medications sent to new pharmacy 

## 2016-08-10 ENCOUNTER — Telehealth: Payer: Self-pay | Admitting: *Deleted

## 2016-08-10 MED ORDER — AZITHROMYCIN 250 MG PO TABS: ORAL_TABLET | ORAL | 1 refills | Status: DC

## 2016-08-10 MED ORDER — FLUCONAZOLE 150 MG PO TABS: ORAL_TABLET | ORAL | 1 refills | Status: DC

## 2016-08-10 NOTE — Telephone Encounter (Signed)
Rec'd call pt states nurse was suppose to [facility] visit, but [facility] never [facility]. Verified chart inform pt scripts was sent to [facility]. Pt states she inform [facility]. She does not use [facility]. Inform pt will resend and update her pharmacy...Johny Chess

## 2016-08-12 NOTE — Telephone Encounter (Signed)
Called [phone]  Automated message stated that switch board was closed for incoming calls.

## 2016-08-22 ENCOUNTER — Encounter (HOSPITAL_BASED_OUTPATIENT_CLINIC_OR_DEPARTMENT_OTHER): Payer: Self-pay | Admitting: *Deleted

## 2016-08-25 DIAGNOSIS — D0511 Intraductal carcinoma in situ of right breast: Secondary | ICD-10-CM | POA: Diagnosis not present

## 2016-08-25 NOTE — H&P (Signed)
History of Present Illness Marland Kitchen T. Berna Gitto MD; 08/25/2016 1:37 PM) The patient is a 61 year old female who presents with breast cancer. She returns to the office for preoperative discussion and evaluation prior to planned bilateral total mastectomy due to a recent diagnosis of ductal carcinoma in situ of the right breast.  Her original presentation was as follows:She is a 61 year old female known to me from previous gastric bypass surgery in May 2016. She has no personal history of any breast disease. She previously has had some clear nipple discharge that resolved on its own in years past. More recently she developed spontaneous bloody nipple discharge from her left breast several weeks ago. At that time she also had some pain and tenderness in the upper inner left breast. This discomfort has since resolved. She was referred to the breast center for imaging. Diagnostic mammogram with spot compression views showed some slight skin and subcutaneous thickening in the left breast lower inner quadrant anterior depth and also a several millimeter circumscribed nodule in the lower inner breast middle depth with some possible ductal extension posteriorly. On targeted ultrasound 9 o'clock position 5 cm from the nipple was a 6 mm hypoechoic circumscribed nodule which was felt to possibly but possibly not correspond to the mammographically seen nodule. MRI was recommended possibly for further evaluation  Subsequently bilateral breast MRI was performed. In the right breast where she has no symptoms was a large, 12.2 x 3.9 x 4.3 area of enhancement in the lower outer breast. In the left breast there were 2 irregular enhancing masses one measuring 1.5 cm and the second more posterior measured 1 cm. MRI guided biopsy of all 3 of these areas were recommended and performed. On the left side this revealed an intraductal papilloma with sclerosis in the more superior of the 2 left-sided lesions and on the more  inferior lesion just fibrocystic change. On the right biopsy revealed ductal carcinoma in situ with calcifications, low-grade, ER PR positive. She comes in today for treatment planning. Again she has no symptoms on the right.  She has seen plastic surgery and at this point does not want reconstruction. She has thought about her options carefully and strongly wants bilateral mastectomy. She understands this will not change her cancer survival. However all her symptoms and multiple imaging findings are present on the left nothing this is reasonable. She also has large breast and symmetry will be initiated.   Problem List/Past Medical Marland Kitchen T. Parish Dubose, MD; 08/25/2016 1:37 PM) MORBID OBESITY (E66.01)  GASTRIC BYPASS STATUS FOR OBESITY (Z98.84)  MALNUTRITION FOLLOWING GASTROINTESTINAL SURGERY (K91.2)  DUCTAL CARCINOMA IN SITU (DCIS) OF RIGHT BREAST (D05.11)  ABNORMAL MRI, BREAST (R92.8)  DISCHARGE FROM LEFT NIPPLE [id number])   Past Surgical History Marland Kitchen T. Deval Mroczka, MD; 08/25/2016 1:37 PM) Hysterectomy (not due to cancer) - Complete  Knee Surgery  Bilateral.  Diagnostic Studies History Marland Kitchen T. Doninique Lwin, MD; 08/25/2016 1:37 PM) Mammogram  within last year Colonoscopy  1-5 years ago Pap Smear  1-5 years ago  Allergies Nance Pear, CMA; 08/25/2016 1:17 PM) No Known Drug Allergies 07/11/2014  Medication History Nance Pear, CMA; 08/25/2016 1:17 PM) Fluconazole (150MG  Tablet, Oral daily) Active. Albuterol Sulfate (108 (90 Base)MCG/ACT Aero Pow Br Act, Inhalation as needed) Active. Levothyroxine Sodium (50MCG Tablet, Oral) Active. Medications Reconciled  Social History Marland Kitchen T. Madysun Thall, MD; 08/25/2016 1:37 PM) Alcohol use  Occasional alcohol use. Caffeine use  Tea. No drug use  Tobacco use  Former smoker.  Family History Marland Kitchen T. Gerasimos Plotts,  MD; 08/25/2016 1:37 PM) Heart Disease  Family Members In General, Mother. Respiratory Condition   Brother. Diabetes Mellitus  Daughter. Hypertension  Father, Mother. Heart disease in female family member before age 48  Cancer  Brother. Arthritis  Mother.  Pregnancy / Birth History Marland Kitchen T. Annie Roseboom, MD; 08/25/2016 1:37 PM) Para  1 Gravida  2 Age of menopause  55-50 Maternal age  47-30 Irregular periods  Age at menarche  32 years.  Other Problems Marland Kitchen T. Debroh Sieloff, MD; 08/25/2016 1:37 PM) Diabetes Mellitus  Back Pain  Arthritis  Gastroesophageal Reflux Disease  High blood pressure  Hypercholesterolemia  Sleep Apnea   Vitals (Sade Bradford CMA; 08/25/2016 1:18 PM) 08/25/2016 1:17 PM Weight: 200.2 lb Height: 64in Body Surface Area: 1.96 m Body Mass Index: 34.36 kg/m  Temp.: 99.47F  Pulse: 103 (Regular)  BP: 132/88 (Sitting, Left Arm, Standard)       Physical Exam Marland Kitchen T. Chan Sheahan MD; 08/25/2016 1:38 PM) The physical exam findings are as follows: Note:General: Alert, moderately obese African-American female, in no distress Skin: Warm and dry without rash or infection. HEENT: No palpable masses or thyromegaly. Sclera nonicteric. Lymph nodes: No cervical, supraclavicular, nodes palpable. Breasts: Large breasts bilaterally. No discrete palpable masses. No skin changes or nipple discharge today. Lungs: Breath sounds clear and equal. No wheezing or increased work of breathing. Cardiovascular: Regular rate and rhythm without murmer. No JVD or edema. Peripheral pulses intact. No carotid bruits. Abdomen: Nondistended. Soft and nontender. No masses palpable. No organomegaly. No palpable hernias. Extremities: No edema or joint swelling or deformity. No chronic venous stasis changes. Neurologic: Alert and fully oriented. Gait normal. No focal weakness. Psychiatric: Normal mood and affect. Thought content appropriate with normal judgement and insight    Assessment & Plan Marland Kitchen T. Suzzette Gasparro MD; 08/25/2016 1:39 PM) DUCTAL CARCINOMA IN  SITU (DCIS) OF RIGHT BREAST (D05.11) Impression: New diagnosis of fairly extensive area of low-grade ductal carcinoma in situ of the right breast. After extensive workup and preoperative discussion with plastic surgery the patient has elected bilateral total mastectomy without reconstruction. We plan to do a sentinel lymph node biopsy on the right. We discussed the procedure today with her daughter present by phone. All questions were answered. We discussed the nature of surgery and recovery as well as risks of anesthetic complications, bleeding, infection and wound healing problems. Ready to proceed with surgery. She is given a prescription for pain medication preoperatively.

## 2016-08-25 NOTE — Progress Notes (Signed)
Boost drink picked up for morning of surgery. Pt verbalized understanding of instructions.  NPO otherwise

## 2016-08-26 ENCOUNTER — Ambulatory Visit (HOSPITAL_BASED_OUTPATIENT_CLINIC_OR_DEPARTMENT_OTHER)
Admission: RE | Admit: 2016-08-26 | Discharge: 2016-08-27 | Disposition: A | Discharge status: Home / Self Care | Payer: Medicare Other | Source: Ambulatory Visit | Attending: General Surgery | Admitting: General Surgery

## 2016-08-26 ENCOUNTER — Ambulatory Visit (HOSPITAL_BASED_OUTPATIENT_CLINIC_OR_DEPARTMENT_OTHER): Payer: Medicare Other | Admitting: Anesthesiology

## 2016-08-26 ENCOUNTER — Encounter (HOSPITAL_BASED_OUTPATIENT_CLINIC_OR_DEPARTMENT_OTHER)
Admission: RE | Disposition: A | Discharge status: Home / Self Care | Payer: Self-pay | Source: Ambulatory Visit | Attending: General Surgery

## 2016-08-26 ENCOUNTER — Encounter (HOSPITAL_BASED_OUTPATIENT_CLINIC_OR_DEPARTMENT_OTHER): Payer: Self-pay | Admitting: *Deleted

## 2016-08-26 ENCOUNTER — Encounter (HOSPITAL_COMMUNITY)
Admission: RE | Admit: 2016-08-26 | Discharge: 2016-08-26 | Disposition: A | Discharge status: Home / Self Care | Payer: Medicare Other | Source: Ambulatory Visit | Attending: General Surgery | Admitting: General Surgery

## 2016-08-26 DIAGNOSIS — E7800 Pure hypercholesterolemia, unspecified: Secondary | ICD-10-CM | POA: Diagnosis not present

## 2016-08-26 DIAGNOSIS — E119 Type 2 diabetes mellitus without complications: Secondary | ICD-10-CM | POA: Diagnosis not present

## 2016-08-26 DIAGNOSIS — D242 Benign neoplasm of left breast: Secondary | ICD-10-CM | POA: Insufficient documentation

## 2016-08-26 DIAGNOSIS — Z9884 Bariatric surgery status: Secondary | ICD-10-CM | POA: Diagnosis not present

## 2016-08-26 DIAGNOSIS — I10 Essential (primary) hypertension: Secondary | ICD-10-CM | POA: Diagnosis not present

## 2016-08-26 DIAGNOSIS — D649 Anemia, unspecified: Secondary | ICD-10-CM | POA: Insufficient documentation

## 2016-08-26 DIAGNOSIS — D0511 Intraductal carcinoma in situ of right breast: Secondary | ICD-10-CM

## 2016-08-26 DIAGNOSIS — J45909 Unspecified asthma, uncomplicated: Secondary | ICD-10-CM | POA: Insufficient documentation

## 2016-08-26 DIAGNOSIS — Z87891 Personal history of nicotine dependence: Secondary | ICD-10-CM | POA: Diagnosis not present

## 2016-08-26 DIAGNOSIS — N6012 Diffuse cystic mastopathy of left breast: Secondary | ICD-10-CM | POA: Insufficient documentation

## 2016-08-26 DIAGNOSIS — G4730 Sleep apnea, unspecified: Secondary | ICD-10-CM | POA: Insufficient documentation

## 2016-08-26 DIAGNOSIS — E039 Hypothyroidism, unspecified: Secondary | ICD-10-CM | POA: Insufficient documentation

## 2016-08-26 DIAGNOSIS — L821 Other seborrheic keratosis: Secondary | ICD-10-CM | POA: Insufficient documentation

## 2016-08-26 DIAGNOSIS — Z90710 Acquired absence of both cervix and uterus: Secondary | ICD-10-CM | POA: Diagnosis not present

## 2016-08-26 DIAGNOSIS — K219 Gastro-esophageal reflux disease without esophagitis: Secondary | ICD-10-CM | POA: Diagnosis not present

## 2016-08-26 DIAGNOSIS — G8918 Other acute postprocedural pain: Secondary | ICD-10-CM | POA: Diagnosis not present

## 2016-08-26 DIAGNOSIS — Z6834 Body mass index (BMI) 34.0-34.9, adult: Secondary | ICD-10-CM | POA: Diagnosis not present

## 2016-08-26 DIAGNOSIS — M1990 Unspecified osteoarthritis, unspecified site: Secondary | ICD-10-CM | POA: Insufficient documentation

## 2016-08-26 HISTORY — PX: MASTECTOMY W/ SENTINEL NODE BIOPSY: SHX2001

## 2016-08-26 LAB — POCT HEMOGLOBIN-HEMACUE: HEMOGLOBIN: 11 g/dL — AB (ref 12.0–15.0)

## 2016-08-26 SURGERY — MASTECTOMY WITH SENTINEL LYMPH NODE BIOPSY
Anesthesia: Regional | Site: Breast | Laterality: Bilateral

## 2016-08-26 MED ORDER — OXYCODONE-ACETAMINOPHEN 5-325 MG PO TABS
1.0000 | ORAL_TABLET | ORAL | Status: DC | PRN
  Administered 2016-08-26 – 2016-08-27 (×3): 2 via ORAL
  Filled 2016-08-26 (×3): qty 2

## 2016-08-26 MED ORDER — MORPHINE SULFATE (PF) 10 MG/ML IV SOLN
INTRAVENOUS | Status: AC
  Filled 2016-08-26: qty 1

## 2016-08-26 MED ORDER — CEFAZOLIN SODIUM-DEXTROSE 2-4 GM/100ML-% IV SOLN
INTRAVENOUS | Status: AC
  Filled 2016-08-26: qty 100

## 2016-08-26 MED ORDER — ONDANSETRON 4 MG PO TBDP: 4.0000 mg | ORAL_TABLET | Freq: Four times a day (QID) | ORAL | Status: DC | PRN

## 2016-08-26 MED ORDER — FENTANYL CITRATE (PF) 100 MCG/2ML IJ SOLN
INTRAMUSCULAR | Status: AC
  Filled 2016-08-26: qty 2

## 2016-08-26 MED ORDER — ALPRAZOLAM 0.25 MG PO TABS: 0.2500 mg | ORAL_TABLET | Freq: Every evening | ORAL | Status: DC | PRN

## 2016-08-26 MED ORDER — DEXAMETHASONE SODIUM PHOSPHATE 10 MG/ML IJ SOLN
INTRAMUSCULAR | Status: AC
  Filled 2016-08-26: qty 1

## 2016-08-26 MED ORDER — TECHNETIUM TC 99M SULFUR COLLOID FILTERED
1.0000 | Freq: Once | INTRAVENOUS | Status: AC | PRN
  Administered 2016-08-26: 1 via INTRADERMAL

## 2016-08-26 MED ORDER — GABAPENTIN 300 MG PO CAPS
ORAL_CAPSULE | ORAL | Status: AC
  Filled 2016-08-26: qty 1

## 2016-08-26 MED ORDER — DEXAMETHASONE SODIUM PHOSPHATE 4 MG/ML IJ SOLN
INTRAMUSCULAR | Status: DC | PRN
  Administered 2016-08-26: 10 mg via INTRAVENOUS

## 2016-08-26 MED ORDER — CELECOXIB 200 MG PO CAPS
ORAL_CAPSULE | ORAL | Status: AC
  Filled 2016-08-26: qty 2

## 2016-08-26 MED ORDER — METHYLENE BLUE 0.5 % INJ SOLN
INTRAVENOUS | Status: AC
  Filled 2016-08-26: qty 10

## 2016-08-26 MED ORDER — SODIUM CHLORIDE 0.9 % IJ SOLN
INTRAMUSCULAR | Status: AC
  Filled 2016-08-26: qty 10

## 2016-08-26 MED ORDER — SUGAMMADEX SODIUM 200 MG/2ML IV SOLN
INTRAVENOUS | Status: AC
  Filled 2016-08-26: qty 2

## 2016-08-26 MED ORDER — LIDOCAINE HCL (CARDIAC) 20 MG/ML IV SOLN
INTRAVENOUS | Status: DC | PRN
  Administered 2016-08-26: 50 mg via INTRAVENOUS

## 2016-08-26 MED ORDER — EPHEDRINE SULFATE 50 MG/ML IJ SOLN
INTRAMUSCULAR | Status: DC | PRN
  Administered 2016-08-26: 15 mg via INTRAVENOUS
  Administered 2016-08-26: 10 mg via INTRAVENOUS

## 2016-08-26 MED ORDER — SCOPOLAMINE 1 MG/3DAYS TD PT72
1.0000 | MEDICATED_PATCH | Freq: Once | TRANSDERMAL | Status: DC | PRN
Start: 2016-08-26 — End: 2016-08-26

## 2016-08-26 MED ORDER — EPHEDRINE 5 MG/ML INJ
INTRAVENOUS | Status: AC
  Filled 2016-08-26: qty 10

## 2016-08-26 MED ORDER — CHLORHEXIDINE GLUCONATE CLOTH 2 % EX PADS: 6.0000 | MEDICATED_PAD | Freq: Once | CUTANEOUS | Status: DC

## 2016-08-26 MED ORDER — PROPOFOL 10 MG/ML IV BOLUS
INTRAVENOUS | Status: AC
  Filled 2016-08-26: qty 20

## 2016-08-26 MED ORDER — ONDANSETRON HCL 4 MG/2ML IJ SOLN
INTRAMUSCULAR | Status: DC | PRN
  Administered 2016-08-26: 4 mg via INTRAVENOUS

## 2016-08-26 MED ORDER — ROCURONIUM BROMIDE 10 MG/ML (PF) SYRINGE
PREFILLED_SYRINGE | INTRAVENOUS | Status: AC
  Filled 2016-08-26: qty 10

## 2016-08-26 MED ORDER — LIDOCAINE 2% (20 MG/ML) 5 ML SYRINGE
INTRAMUSCULAR | Status: AC
  Filled 2016-08-26: qty 5

## 2016-08-26 MED ORDER — ONDANSETRON HCL 4 MG/2ML IJ SOLN: 4.0000 mg | Freq: Four times a day (QID) | INTRAMUSCULAR | Status: DC | PRN

## 2016-08-26 MED ORDER — MIDAZOLAM HCL 2 MG/2ML IJ SOLN
INTRAMUSCULAR | Status: AC
  Filled 2016-08-26: qty 2

## 2016-08-26 MED ORDER — ENOXAPARIN SODIUM 40 MG/0.4ML ~~LOC~~ SOLN: 40.0000 mg | SUBCUTANEOUS | Status: DC

## 2016-08-26 MED ORDER — ALBUTEROL SULFATE HFA 108 (90 BASE) MCG/ACT IN AERS: 2.0000 | INHALATION_SPRAY | Freq: Four times a day (QID) | RESPIRATORY_TRACT | Status: DC | PRN

## 2016-08-26 MED ORDER — GABAPENTIN 300 MG PO CAPS
300.0000 mg | ORAL_CAPSULE | ORAL | Status: AC
  Administered 2016-08-26: 300 mg via ORAL

## 2016-08-26 MED ORDER — CELECOXIB 400 MG PO CAPS
400.0000 mg | ORAL_CAPSULE | ORAL | Status: AC
  Administered 2016-08-26: 400 mg via ORAL

## 2016-08-26 MED ORDER — ONDANSETRON HCL 4 MG/2ML IJ SOLN
INTRAMUSCULAR | Status: AC
  Filled 2016-08-26: qty 2

## 2016-08-26 MED ORDER — MORPHINE SULFATE (PF) 2 MG/ML IV SOLN: 2.0000 mg | INTRAVENOUS | Status: DC | PRN

## 2016-08-26 MED ORDER — HYDROMORPHONE HCL 1 MG/ML IJ SOLN
0.2500 mg | INTRAMUSCULAR | Status: DC | PRN
  Administered 2016-08-26: 0.25 mg via INTRAVENOUS

## 2016-08-26 MED ORDER — PROMETHAZINE HCL 25 MG/ML IJ SOLN: 6.2500 mg | INTRAMUSCULAR | Status: DC | PRN

## 2016-08-26 MED ORDER — FENTANYL CITRATE (PF) 100 MCG/2ML IJ SOLN
50.0000 ug | INTRAMUSCULAR | Status: DC | PRN
  Administered 2016-08-26 (×2): 50 ug via INTRAVENOUS

## 2016-08-26 MED ORDER — OXYCODONE HCL 5 MG/5ML PO SOLN: 5.0000 mg | Freq: Once | ORAL | Status: DC | PRN

## 2016-08-26 MED ORDER — SODIUM CHLORIDE 0.9 % IJ SOLN
INTRAMUSCULAR | Status: DC | PRN
  Administered 2016-08-26: 5 mL

## 2016-08-26 MED ORDER — ACETAMINOPHEN 500 MG PO TABS
ORAL_TABLET | ORAL | Status: AC
Start: 2016-08-26 — End: 2016-08-26
  Filled 2016-08-26: qty 2

## 2016-08-26 MED ORDER — CEFAZOLIN SODIUM-DEXTROSE 2-4 GM/100ML-% IV SOLN
2.0000 g | INTRAVENOUS | Status: AC
  Administered 2016-08-26: 2 g via INTRAVENOUS

## 2016-08-26 MED ORDER — ACETAMINOPHEN 500 MG PO TABS
1000.0000 mg | ORAL_TABLET | ORAL | Status: AC
  Administered 2016-08-26: 1000 mg via ORAL

## 2016-08-26 MED ORDER — LEVOTHYROXINE SODIUM 25 MCG PO TABS
25.0000 ug | ORAL_TABLET | Freq: Every day | ORAL | Status: DC
Start: 2016-08-27 — End: 2016-08-27

## 2016-08-26 MED ORDER — MORPHINE SULFATE 10 MG/ML IJ SOLN
INTRAMUSCULAR | Status: DC | PRN
  Administered 2016-08-26 (×2): 2 mg via INTRAVENOUS
  Administered 2016-08-26: 1 mg via INTRAVENOUS
  Administered 2016-08-26: 2 mg via INTRAVENOUS
  Administered 2016-08-26: 1 mg via INTRAVENOUS

## 2016-08-26 MED ORDER — ROCURONIUM BROMIDE 100 MG/10ML IV SOLN
INTRAVENOUS | Status: DC | PRN
  Administered 2016-08-26: 10 mg via INTRAVENOUS
  Administered 2016-08-26: 20 mg via INTRAVENOUS
  Administered 2016-08-26: 50 mg via INTRAVENOUS

## 2016-08-26 MED ORDER — PROPOFOL 10 MG/ML IV BOLUS
INTRAVENOUS | Status: DC | PRN
  Administered 2016-08-26: 160 mg via INTRAVENOUS

## 2016-08-26 MED ORDER — OXYCODONE HCL 5 MG PO TABS: 5.0000 mg | ORAL_TABLET | Freq: Once | ORAL | Status: DC | PRN

## 2016-08-26 MED ORDER — LABETALOL HCL 5 MG/ML IV SOLN
INTRAVENOUS | Status: AC
  Filled 2016-08-26: qty 4

## 2016-08-26 MED ORDER — POTASSIUM CHLORIDE IN NACL 20-0.9 MEQ/L-% IV SOLN
INTRAVENOUS | Status: DC
  Filled 2016-08-26: qty 1000

## 2016-08-26 MED ORDER — LACTATED RINGERS IV SOLN
INTRAVENOUS | Status: DC
  Administered 2016-08-26 (×2): via INTRAVENOUS

## 2016-08-26 MED ORDER — HYDROMORPHONE HCL 1 MG/ML IJ SOLN
INTRAMUSCULAR | Status: AC
  Filled 2016-08-26: qty 1

## 2016-08-26 MED ORDER — SUGAMMADEX SODIUM 200 MG/2ML IV SOLN
INTRAVENOUS | Status: DC | PRN
  Administered 2016-08-26: 200 mg via INTRAVENOUS

## 2016-08-26 MED ORDER — BUPIVACAINE-EPINEPHRINE (PF) 0.25% -1:200000 IJ SOLN
INTRAMUSCULAR | Status: DC | PRN
  Administered 2016-08-26: 60 mL

## 2016-08-26 MED ORDER — MEPERIDINE HCL 25 MG/ML IJ SOLN: 6.2500 mg | INTRAMUSCULAR | Status: DC | PRN

## 2016-08-26 MED ORDER — MIDAZOLAM HCL 2 MG/2ML IJ SOLN
1.0000 mg | INTRAMUSCULAR | Status: DC | PRN
  Administered 2016-08-26: 2 mg via INTRAVENOUS

## 2016-08-26 MED ORDER — LABETALOL HCL 5 MG/ML IV SOLN
INTRAVENOUS | Status: DC | PRN
  Administered 2016-08-26 (×2): 2.5 mg via INTRAVENOUS

## 2016-08-26 SURGICAL SUPPLY — 76 items
APPLIER CLIP 11 MED OPEN (CLIP)
APPLIER CLIP 9.375 MED OPEN (MISCELLANEOUS) ×3
BENZOIN TINCTURE PRP APPL 2/3 (GAUZE/BANDAGES/DRESSINGS) IMPLANT
BINDER BREAST XLRG (GAUZE/BANDAGES/DRESSINGS) ×3 IMPLANT
BIOPATCH RED 1 DISK 7.0 (GAUZE/BANDAGES/DRESSINGS) ×4 IMPLANT
BIOPATCH RED 1IN DISK 7.0MM (GAUZE/BANDAGES/DRESSINGS) ×2
BLADE CLIPPER SURG (BLADE) IMPLANT
BLADE HEX COATED 2.75 (ELECTRODE) IMPLANT
BLADE SURG 10 STRL SS (BLADE) ×6 IMPLANT
BLADE SURG 15 STRL LF DISP TIS (BLADE) ×1 IMPLANT
BLADE SURG 15 STRL SS (BLADE) ×2
CANISTER SUCT 1200ML W/VALVE (MISCELLANEOUS) ×3 IMPLANT
CHLORAPREP W/TINT 26ML (MISCELLANEOUS) ×6 IMPLANT
CLIP APPLIE 11 MED OPEN (CLIP) IMPLANT
CLIP APPLIE 9.375 MED OPEN (MISCELLANEOUS) ×1 IMPLANT
CLOSURE WOUND 1/2 X4 (GAUZE/BANDAGES/DRESSINGS)
COVER BACK TABLE 60X90IN (DRAPES) ×3 IMPLANT
COVER MAYO STAND STRL (DRAPES) ×3 IMPLANT
COVER PROBE W GEL 5X96 (DRAPES) ×3 IMPLANT
DECANTER SPIKE VIAL GLASS SM (MISCELLANEOUS) IMPLANT
DERMABOND ADVANCED (GAUZE/BANDAGES/DRESSINGS) ×8
DERMABOND ADVANCED .7 DNX12 (GAUZE/BANDAGES/DRESSINGS) ×4 IMPLANT
DEVICE DISSECT PLASMABLAD 3.0S (MISCELLANEOUS) ×1 IMPLANT
DEVICE DUBIN W/COMP PLATE 8390 (MISCELLANEOUS) IMPLANT
DRAIN CHANNEL 19F RND (DRAIN) ×6 IMPLANT
DRAIN HEMOVAC 1/8 X 5 (WOUND CARE) IMPLANT
DRAPE LAPAROSCOPIC ABDOMINAL (DRAPES) ×3 IMPLANT
DRAPE SURG 17X23 STRL (DRAPES) ×12 IMPLANT
DRAPE UTILITY XL STRL (DRAPES) ×3 IMPLANT
DRSG PAD ABDOMINAL 8X10 ST (GAUZE/BANDAGES/DRESSINGS) ×9 IMPLANT
ELECT REM PT RETURN 9FT ADLT (ELECTROSURGICAL) ×3
ELECTRODE REM PT RTRN 9FT ADLT (ELECTROSURGICAL) ×1 IMPLANT
EVACUATOR SILICONE 100CC (DRAIN) ×6 IMPLANT
GAUZE SPONGE 4X4 12PLY STRL (GAUZE/BANDAGES/DRESSINGS) ×6 IMPLANT
GAUZE VASELINE 3X9 (GAUZE/BANDAGES/DRESSINGS) IMPLANT
GLOVE BIOGEL M STRL SZ7.5 (GLOVE) ×3 IMPLANT
GLOVE BIOGEL PI IND STRL 7.0 (GLOVE) ×2 IMPLANT
GLOVE BIOGEL PI IND STRL 8 (GLOVE) ×2 IMPLANT
GLOVE BIOGEL PI INDICATOR 7.0 (GLOVE) ×4
GLOVE BIOGEL PI INDICATOR 8 (GLOVE) ×4
GLOVE ECLIPSE 6.5 STRL STRAW (GLOVE) ×3 IMPLANT
GLOVE ECLIPSE 7.5 STRL STRAW (GLOVE) ×3 IMPLANT
GLOVE SURG SS PI 7.0 STRL IVOR (GLOVE) ×3 IMPLANT
GOWN STRL REUS W/ TWL LRG LVL3 (GOWN DISPOSABLE) ×1 IMPLANT
GOWN STRL REUS W/ TWL XL LVL3 (GOWN DISPOSABLE) ×2 IMPLANT
GOWN STRL REUS W/TWL LRG LVL3 (GOWN DISPOSABLE) ×2
GOWN STRL REUS W/TWL XL LVL3 (GOWN DISPOSABLE) ×4
LIGHT WAVEGUIDE WIDE FLAT (MISCELLANEOUS) IMPLANT
NDL SAFETY ECLIPSE 18X1.5 (NEEDLE) IMPLANT
NEEDLE HYPO 18GX1.5 SHARP (NEEDLE)
NEEDLE HYPO 25X1 1.5 SAFETY (NEEDLE) ×3 IMPLANT
NS IRRIG 1000ML POUR BTL (IV SOLUTION) ×3 IMPLANT
PACK BASIN DAY SURGERY FS (CUSTOM PROCEDURE TRAY) ×3 IMPLANT
PENCIL BUTTON HOLSTER BLD 10FT (ELECTRODE) IMPLANT
PIN SAFETY STERILE (MISCELLANEOUS) ×3 IMPLANT
PLASMABLADE 3.0S (MISCELLANEOUS) ×3
SLEEVE SCD COMPRESS KNEE MED (MISCELLANEOUS) ×3 IMPLANT
SPONGE LAP 18X18 X RAY DECT (DISPOSABLE) ×9 IMPLANT
SPONGE LAP 4X18 X RAY DECT (DISPOSABLE) IMPLANT
STAPLER VISISTAT 35W (STAPLE) ×3 IMPLANT
STRIP CLOSURE SKIN 1/2X4 (GAUZE/BANDAGES/DRESSINGS) IMPLANT
SUT ETHILON 3 0 PS 1 (SUTURE) ×6 IMPLANT
SUT MON AB 4-0 PC3 18 (SUTURE) ×6 IMPLANT
SUT SILK 2 0 SH (SUTURE) ×6 IMPLANT
SUT VIC AB 3-0 54X BRD REEL (SUTURE) ×1 IMPLANT
SUT VIC AB 3-0 BRD 54 (SUTURE) ×2
SUT VIC AB 3-0 SH 27 (SUTURE) ×2
SUT VIC AB 3-0 SH 27X BRD (SUTURE) ×1 IMPLANT
SUT VICRYL 3-0 CR8 SH (SUTURE) ×12 IMPLANT
SUT VICRYL AB 3 0 TIES (SUTURE) ×3 IMPLANT
SYR CONTROL 10ML LL (SYRINGE) ×6 IMPLANT
TOWEL OR 17X24 6PK STRL BLUE (TOWEL DISPOSABLE) ×6 IMPLANT
TOWEL OR NON WOVEN STRL DISP B (DISPOSABLE) IMPLANT
TUBE CONNECTING 20'X1/4 (TUBING) ×2
TUBE CONNECTING 20X1/4 (TUBING) ×4 IMPLANT
YANKAUER SUCT BULB TIP NO VENT (SUCTIONS) ×3 IMPLANT

## 2016-08-26 NOTE — Transfer of Care (Signed)
Immediate Anesthesia Transfer of Care Note  Patient: Elizabeth Hawkins  Procedure(s) Performed: Procedure(s): BILATERAL TOTAL MASTECTOMIES WITH RIGHT AXILLARY SENTINEL NODE BIOPSY (Bilateral)  Patient Location: PACU  Anesthesia Type:General  Level of Consciousness: awake and sedated  Airway & Oxygen Therapy: Patient Spontanous Breathing and Patient connected to face mask oxygen  Post-op Assessment: Report given to RN and Post -op Vital signs reviewed and stable  Post vital signs: Reviewed and stable  Last Vitals:  Vitals:   08/26/16 1005 08/26/16 1010  BP: 112/65 (!) 112/54  Pulse: 90 85  Resp: 14 14  Temp:      Last Pain:  Vitals:   08/26/16 0850  TempSrc: Oral         Complications: No apparent anesthesia complications

## 2016-08-26 NOTE — Anesthesia Preprocedure Evaluation (Addendum)
Anesthesia Evaluation  Patient identified by MRN, date of birth, ID band Patient awake    Reviewed: Allergy & Precautions, NPO status , Patient's Chart, lab work & pertinent test results  Airway Mallampati: II  TM Distance: >3 FB Neck ROM: Full    Dental no notable dental hx.    Pulmonary neg pulmonary ROS, asthma , sleep apnea , former smoker,    Pulmonary exam normal breath sounds clear to auscultation       Cardiovascular hypertension, Pt. on medications negative cardio ROS Normal cardiovascular exam Rhythm:Regular Rate:Normal  Cardiology office visit 07-14-14 Dr. Harrington Challenger reviewed.   Neuro/Psych PSYCHIATRIC DISORDERS Anxiety Depression negative neurological ROS  negative psych ROS   GI/Hepatic negative GI ROS, Neg liver ROS, GERD  Medicated,  Endo/Other  diabetes, Type 2, Oral Hypoglycemic AgentsHypothyroidism Morbid obesity  Renal/GU negative Renal ROS  negative genitourinary   Musculoskeletal negative musculoskeletal ROS (+) Arthritis ,   Abdominal (+) + obese,   Peds negative pediatric ROS (+)  Hematology negative hematology ROS (+) anemia ,   Anesthesia Other Findings   Reproductive/Obstetrics negative OB ROS                             Anesthesia Physical  Anesthesia Plan  ASA: III  Anesthesia Plan: General   Post-op Pain Management: GA combined w/ Regional for post-op pain   Induction: Intravenous  Airway Management Planned: Oral ETT  Additional Equipment:   Intra-op Plan: Delibrate Circulatory arrest per surgeon request  Post-operative Plan: Extubation in OR  Informed Consent: I have reviewed the patients History and Physical, chart, labs and discussed the procedure including the risks, benefits and alternatives for the proposed anesthesia with the patient or authorized representative who has indicated his/her understanding and acceptance.   Dental advisory  given  Plan Discussed with: CRNA  Anesthesia Plan Comments: (Pt with mild fever, but denies myalgias or cough. Discussed with [place] and patient that she is likely at higher risk if this is a pulmonary infection. )       Anesthesia Quick Evaluation

## 2016-08-26 NOTE — Anesthesia Procedure Notes (Addendum)
Anesthesia Regional Block:  Pectoralis block  Pre-Anesthetic Checklist: ,, timeout performed, Correct Patient, Correct Site, Correct Laterality, Correct Procedure, Correct Position, site marked, Risks and benefits discussed,  Surgical consent,  Pre-op evaluation,  At surgeon's request and post-op pain management  Laterality: Left and Right  Prep: chloraprep       Needles:   Needle Type: Stimiplex     Needle Length: 9cm 9 cm     Additional Needles:  Procedures: ultrasound guided (picture in chart) Pectoralis block Narrative:  Start time: 08/26/2016 10:10 AM End time: 08/26/2016 10:30 AM Injection made incrementally with aspirations every 5 mL.  Performed by: Personally  Anesthesiologist: Nolon Nations  Additional Notes: Patient tolerated well. Good fascial spread noted.

## 2016-08-26 NOTE — Anesthesia Procedure Notes (Signed)
Procedure Name: Intubation Performed by: Terrance Mass Pre-anesthesia Checklist: Patient identified, Emergency Drugs available, Suction available and Patient being monitored Patient Re-evaluated:Patient Re-evaluated prior to inductionOxygen Delivery Method: Circle system utilized Preoxygenation: Pre-oxygenation with 100% oxygen Intubation Type: IV induction Ventilation: Mask ventilation without difficulty Laryngoscope Size: Miller and 2 Grade View: Grade II Tube type: Oral Number of attempts: 1 Airway Equipment and Method: Stylet and Oral airway Placement Confirmation: ETT inserted through vocal cords under direct vision,  positive ETCO2 and breath sounds checked- equal and bilateral Secured at: 23 cm Tube secured with: Tape Dental Injury: Teeth and Oropharynx as per pre-operative assessment

## 2016-08-26 NOTE — Anesthesia Postprocedure Evaluation (Signed)
Anesthesia Post Note  Patient: Elizabeth Hawkins  Procedure(s) Performed: Procedure(s) (LRB): BILATERAL TOTAL MASTECTOMIES WITH RIGHT AXILLARY SENTINEL NODE BIOPSY (Bilateral)  Patient location during evaluation: PACU Anesthesia Type: General and Regional Level of consciousness: sedated and patient cooperative Pain management: pain level controlled Vital Signs Assessment: post-procedure vital signs reviewed and stable Respiratory status: spontaneous breathing Cardiovascular status: stable Anesthetic complications: no       Last Vitals:  Vitals:   08/26/16 1515 08/26/16 1530  BP: 126/81 117/74  Pulse: 90 74  Resp: 14 12  Temp:      Last Pain:  Vitals:   08/26/16 1530  TempSrc:   PainSc: [place]

## 2016-08-26 NOTE — Op Note (Signed)
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 After extensive preoperative discussion and consultation detail this where she has elected to proceed with bilateral total mastectomy with right axillary sentinel lymph node biopsy. She has declined reconstruction at this time.    Procedure Detail:  In the preop area the patient underwent injection of 1 mCi of technetium sulfur colloid intradermally around the right nipple.  She was taken to the operating room, placed in the supine position on the operating table and laryngeal mask general anesthesia induced. She was carefully positioned with arms extended. She received preoperative IV antibiotics. The entire anterior chest and breasts, axillae and upper arms were widely sterilely prepped and draped. Patient timeout was performed and correct procedure verified. I initially did not obtain very high counts in the right axilla and I proceeded to inject 5 mL of dilute methylene blue subcutaneously beneath the right nipple and massaged this for several minutes. The left mastectomy was approached initially. A slightly oblique elliptical incision was used transversely oriented and dissection carried down into the subcutaneous tissue using the plasma blade. Skin and subcutaneous flaps were then raised superiorly toward the clavicle, medially to the edge of the sternum, inferiorly to the inframammary crease and laterally out toward the anterior border of latissimus which was dissected and  identified. The axillary skin was raised up over the axillary contents. Following this the breast was reflected up off the chest wall working medial to lateral separating from attachments to the pectoralis major and to the serratus. As the dissection progressed laterally the specimen was dissected up off the anterior border of latissimus inferiorly and the border of the pectoralis major identified and the clavipectoral fascia incised. The specimen was dissected further off the serratus and off the latissimus up to the junction of the talus Bence with the axillary contents. As I began to come across this area there was one prominent node right at the line of dissection which I excised and sent as a separate specimen. I then came across the low axilla/talus Bence with Kelly clamps and the specimen was removed and oriented. This tissue was tied with 3-0 Vicryl. The wound was thoroughly irrigated and complete hemostasis obtained. A 19 Blake closed suction drain was left through a separate stab wound and placed underneath both flaps. The subcutaneous tissues tissue was closed with interrupted 3-0 Vicryl. Attention was then turned to the right mastectomy. An identical incision was made and skin flaps developed in an identical fashion. The breast was dissected from medial to lateral also identical to the other side. As I came up to the axillary contents and divided the clavipectoral fascia the neoprobe was used to localize a hot lymph node which was dissected free and also contained bright blue dye. Ex vivo this node had counts of about 300. A second adjacent lymph node with moderately high counts and slight blue dye staining was also excised. At this point there was no indication of any counts in the axilla and no blue dye seen. There were some prominent lymph nodes in the axilla but they were  very soft and did not contain counts. These 2 nodes were sent as  hot blue right axillary sentinel lymph nodes. This wound was  also irrigated and complete hemostasis obtained and the subcutaneous tissue closed over a drain in identical fashion. Both skin incisions were closed with running subcutaneous technique of 4-0 Monocryl and Dermabond. Sponge needle and instrument counts were correct.    Findings: As above  Estimated Blood Loss:  250 mL         Drains: 19 round Blake drain on each side  Blood Given: none          Specimens: #1 left total mastectomy  #2 prominent left axillary lymph node   #3 right total mastectomy   #4 hot blue right axillary sentinel lymph nodes X 2        Complications:  * No complications entered in OR log *         Disposition: PACU - hemodynamically stable.         Condition: stable

## 2016-08-26 NOTE — Progress Notes (Signed)
Emotional support during breast injections °

## 2016-08-26 NOTE — Progress Notes (Signed)
Assisted Dr. Lissa Hoard with right, left, pectoralis block. Side rails up, monitors on throughout procedure. See vital signs in flow sheet. Tolerated Procedure well.

## 2016-08-26 NOTE — Interval H&P Note (Signed)
History and Physical Interval Note:  08/26/2016 10:54 AM  Elizabeth Hawkins  has presented today for surgery, with the diagnosis of DCIS RIGHT BREAST  The various methods of treatment have been discussed with the patient and family. After consideration of risks, benefits and other options for treatment, the patient has consented to  Procedure(s): BILATERAL TOTAL MASTECTOMIES WITH RIGHT AXILLARY SENTINEL NODE BIOPSY (Bilateral) as a surgical intervention .  The patient's history has been reviewed, patient examined, no change in status, stable for surgery.  I have reviewed the patient's chart and labs.  Questions were answered to the patient's satisfaction.     [name]

## 2016-08-27 DIAGNOSIS — N6012 Diffuse cystic mastopathy of left breast: Secondary | ICD-10-CM | POA: Diagnosis not present

## 2016-08-27 DIAGNOSIS — M1990 Unspecified osteoarthritis, unspecified site: Secondary | ICD-10-CM | POA: Diagnosis not present

## 2016-08-27 DIAGNOSIS — D0511 Intraductal carcinoma in situ of right breast: Secondary | ICD-10-CM | POA: Diagnosis not present

## 2016-08-27 DIAGNOSIS — E119 Type 2 diabetes mellitus without complications: Secondary | ICD-10-CM | POA: Diagnosis not present

## 2016-08-27 DIAGNOSIS — D242 Benign neoplasm of left breast: Secondary | ICD-10-CM | POA: Diagnosis not present

## 2016-08-27 DIAGNOSIS — L821 Other seborrheic keratosis: Secondary | ICD-10-CM | POA: Diagnosis not present

## 2016-08-27 MED ORDER — ONDANSETRON 4 MG PO TBDP: 4.0000 mg | ORAL_TABLET | Freq: Four times a day (QID) | ORAL | 0 refills | Status: DC | PRN

## 2016-08-27 NOTE — Discharge Instructions (Signed)
Post Anesthesia Home Care Instructions  Activity: Get plenty of rest for the remainder of the day. A responsible adult should stay with you for 24 hours following the procedure.  For the next 24 hours, DO NOT: -Drive a car -Paediatric nurse -Drink alcoholic beverages -Take any medication unless instructed by your physician -Make any legal decisions or sign important papers.  Meals: Start with liquid foods such as gelatin or soup. Progress to regular foods as tolerated. Avoid greasy, spicy, heavy foods. If nausea and/or vomiting occur, drink only clear liquids until the nausea and/or vomiting subsides. Call your physician if vomiting continues.      JP Drain [employer] this sheet to all of your post-operative appointments while you have your drains.  Please measure your drains by CC's or ML's.  Make sure you drain and measure your JP Drains 2 or 3 times per day.  At the end of each day, add up totals for the left side and add up totals for the right side.    ( 9 am )     ( 3 pm )        ( 9 pm )                Date L  R  L  R  L  R  Total L/R                                                                                                                                                                                        Special Instructions/Symptoms: Your throat may feel dry or sore from the anesthesia or the breathing tube placed in your throat during surgery. If this causes discomfort, gargle with warm salt water. The discomfort should disappear within 24 hours.  If you had a scopolamine patch placed behind your ear for the management of post- operative nausea and/or vomiting:  1. The medication in the patch is effective for 72 hours, after which it should be removed.  Wrap patch in a tissue and discard in the trash. Wash hands thoroughly with soap and water. 2. You may remove the patch earlier than 72 hours if you experience unpleasant side effects which may  include dry mouth, dizziness or visual disturbances. 3. Avoid touching the patch. Wash your hands with soap and water after contact with the patch.       [facility] [phone]  MASTECTOMY: POST OP INSTRUCTIONS  Always review your discharge instruction sheet given to you by the facility where your surgery was performed. IF YOU HAVE DISABILITY OR FAMILY LEAVE FORMS, YOU MUST BRING THEM TO THE OFFICE  FOR PROCESSING.   DO NOT GIVE THEM TO YOUR DOCTOR. A prescription for pain medication may be given to you upon discharge.  Take your pain medication as prescribed, if needed.  If narcotic pain medicine is not needed, then you may take acetaminophen (Tylenol) or ibuprofen (Advil) as needed. 1. Take your usually prescribed medications unless otherwise directed. 2. If you need a refill on your pain medication, please contact your pharmacy.  They will contact our office to request authorization.  Prescriptions will not be filled after 5pm or on week-ends. 3. You should follow a light diet the first few days after arrival home, such as soup and crackers, etc.  Resume your normal diet the day after surgery. 4. Most patients will experience some swelling and bruising on the chest and underarm.  Ice packs will help.  Swelling and bruising can take several days to resolve.  5. It is common to experience some constipation if taking pain medication after surgery.  Increasing fluid intake and taking a stool softener (such as Colace) will usually help or prevent this problem from occurring.  A mild laxative (Milk of Magnesia or Miralax) should be taken according to package instructions if there are no bowel movements after 48 hours. 6. Unless discharge instructions indicate otherwise, leave your bandage dry and in place until your next appointment in 3-5 days.  You may take a limited sponge bath.  No tube baths or showers until the drains are removed.  You may have steri-strips (small skin  tapes) in place directly over the incision.  These strips should be left on the skin for 7-10 days.  If your surgeon used skin glue on the incision, you may shower in 24 hours.  The glue will flake off over the next 2-3 weeks.  Any sutures or staples will be removed at the office during your follow-up visit. 7. DRAINS:  If you have drains in place, it is important to keep a list of the amount of drainage produced each day in your drains.  Before leaving the hospital, you should be instructed on drain care.  Call our office if you have any questions about your drains. 8. ACTIVITIES:  You may resume regular (light) daily activities beginning the next day--such as daily self-care, walking, climbing stairs--gradually increasing activities as tolerated.  You may have sexual intercourse when it is comfortable.  Refrain from any heavy lifting or straining until approved by your doctor. a. You may drive when you are no longer taking prescription pain medication, you can comfortably wear a seatbelt, and you can safely maneuver your car and apply brakes. b. RETURN TO WORK:  __________________________________________________________ 9. You should see your doctor in the office for a follow-up appointment approximately 3-5 days after your surgery.  Your doctors nurse will typically make your follow-up appointment when she calls you with your pathology report.  Expect your pathology report 2-3 business days after your surgery.  You may call to check if you do not hear from [place] after three days.   10. OTHER INSTRUCTIONS: ______________________________________________________________________________________________ ____________________________________________________________________________________________ WHEN TO CALL YOUR DOCTOR: 1. Fever over 101.0 2. Nausea and/or vomiting 3. Extreme swelling or bruising 4. Continued bleeding from incision. 5. Increased pain, redness, or drainage from the incision. The clinic staff  is available to answer your questions during regular business hours.  Please dont hesitate to call and ask to speak to one of the nurses for clinical concerns.  If you have a medical emergency, go to the nearest  emergency room or call 911.  A surgeon from [facility] is always on call at the hospital. [address] [place], [place], [place]  [address] ? [address] [place], [place],    [address] [phone] ? [phone] ? FAX [phone] Web site: [email or web address]

## 2016-08-27 NOTE — Progress Notes (Signed)
1 Day Post-Op  Subjective: Doing well  Objective: Vital signs in last 24 hours: Temp:  [97.7 F (36.5 C)-100.2 F (37.9 C)] 98 F (36.7 C) (01/20 0700) Pulse Rate:  [60-97] 63 (01/20 0700) Resp:  [6-24] 16 (01/20 0700) BP: (97-131)/(44-81) 106/70 (01/20 0700) SpO2:  [95 %-100 %] 100 % (01/20 0700) Weight:  [91.4 kg (201 lb 6.4 oz)] 91.4 kg (201 lb 6.4 oz) (01/19 0850)    Intake/Output from previous day: 01/19 0701 - 01/20 0700 In: 2490 [P.O.:290; I.V.:2200] Out: 1560 [Urine:950; Drains:360; Blood:250] Intake/Output this shift: No intake/output data recorded.  Flaps viable  Drainage is minimal bilaterally   Lab Results:   Recent Labs  08/26/16 0905  HGB 11.0*   BMET No results for input(s): NA, K, CL, CO2, GLUCOSE, BUN, CREATININE, CALCIUM in the last 72 hours. PT/INR No results for input(s): LABPROT, INR in the last 72 hours. ABG No results for input(s): PHART, HCO3 in the last 72 hours.  Invalid input(s): PCO2, PO2  Studies/Results: No results found.  Anti-infectives: Anti-infectives    Start     Dose/Rate Route Frequency Ordered Stop   08/26/16 0829  ceFAZolin (ANCEF) IVPB 2g/100 mL premix     2 g 200 mL/hr over 30 Minutes Intravenous On call to O.R. 08/26/16 0829 08/26/16 1100      Assessment/Plan: s/p Procedure(s): BILATERAL TOTAL MASTECTOMIES WITH RIGHT AXILLARY SENTINEL NODE BIOPSY (Bilateral) Discharge   LOS: 0 days    Leylah Tarnow A. 08/27/2016

## 2016-08-29 ENCOUNTER — Encounter (HOSPITAL_BASED_OUTPATIENT_CLINIC_OR_DEPARTMENT_OTHER): Payer: Self-pay | Admitting: General Surgery

## 2016-08-29 NOTE — Telephone Encounter (Signed)
Requested exam notes.

## 2016-08-31 ENCOUNTER — Telehealth: Payer: Self-pay | Admitting: *Deleted

## 2016-08-31 NOTE — Telephone Encounter (Signed)
Called pt and discussed d/t pt final pathology, pt does not need f/u with Dr. Burr Medico for breast cancer treatment. Informed pt that per Dr. Burr Medico her anemia has resolved and does not need further f/u at this time. Discussed with pt that if anemia should recur to come and f/u with Dr. Burr Medico. Received verbal understanding. Denies further needs at this time.

## 2016-09-05 ENCOUNTER — Telehealth: Payer: Self-pay | Admitting: Hematology

## 2016-09-05 NOTE — Telephone Encounter (Signed)
lvm to inform pt of 3/8 appt for SCP at 10 am per LOS

## 2016-09-14 ENCOUNTER — Other Ambulatory Visit: Payer: Self-pay | Admitting: Internal Medicine

## 2016-10-05 ENCOUNTER — Ambulatory Visit: Payer: Medicare Other | Attending: General Surgery | Admitting: Physical Therapy

## 2016-10-05 DIAGNOSIS — Z483 Aftercare following surgery for neoplasm: Secondary | ICD-10-CM | POA: Diagnosis not present

## 2016-10-05 DIAGNOSIS — M6281 Muscle weakness (generalized): Secondary | ICD-10-CM | POA: Insufficient documentation

## 2016-10-05 DIAGNOSIS — M25612 Stiffness of left shoulder, not elsewhere classified: Secondary | ICD-10-CM

## 2016-10-05 DIAGNOSIS — M25611 Stiffness of right shoulder, not elsewhere classified: Secondary | ICD-10-CM | POA: Diagnosis not present
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
## 2016-10-12 NOTE — Progress Notes (Signed)
Pre visit review using our clinic review tool, if applicable. No additional management support is needed unless otherwise documented below in the visit note. 
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
## 2016-10-13 ENCOUNTER — Ambulatory Visit (HOSPITAL_BASED_OUTPATIENT_CLINIC_OR_DEPARTMENT_OTHER): Payer: Medicare Other | Admitting: Adult Health

## 2016-10-13 ENCOUNTER — Ambulatory Visit (INDEPENDENT_AMBULATORY_CARE_PROVIDER_SITE_OTHER): Payer: Medicare Other | Admitting: *Deleted

## 2016-10-13 ENCOUNTER — Encounter: Payer: Self-pay | Admitting: Adult Health

## 2016-10-13 ENCOUNTER — Ambulatory Visit: Payer: Medicare Other | Attending: General Surgery

## 2016-10-13 VITALS — BP 126/68 | HR 73 | Resp 18 | Ht 64.0 in | Wt 191.0 lb

## 2016-10-13 VITALS — BP 127/64 | HR 73 | Temp 98.2°F | Resp 18 | Wt 190.4 lb

## 2016-10-13 DIAGNOSIS — Z86000 Personal history of in-situ neoplasm of breast: Secondary | ICD-10-CM | POA: Diagnosis not present

## 2016-10-13 DIAGNOSIS — D0511 Intraductal carcinoma in situ of right breast: Secondary | ICD-10-CM | POA: Diagnosis not present

## 2016-10-13 DIAGNOSIS — Z0000 Encounter for general adult medical examination without abnormal findings: Secondary | ICD-10-CM | POA: Diagnosis not present

## 2016-10-13 DIAGNOSIS — R5383 Other fatigue: Secondary | ICD-10-CM | POA: Diagnosis not present

## 2016-10-13 DIAGNOSIS — I10 Essential (primary) hypertension: Secondary | ICD-10-CM | POA: Diagnosis not present

## 2016-10-13 DIAGNOSIS — M25611 Stiffness of right shoulder, not elsewhere classified: Secondary | ICD-10-CM | POA: Insufficient documentation

## 2016-10-13 DIAGNOSIS — M6281 Muscle weakness (generalized): Secondary | ICD-10-CM | POA: Diagnosis not present

## 2016-10-13 DIAGNOSIS — R293 Abnormal posture: Secondary | ICD-10-CM | POA: Insufficient documentation

## 2016-10-13 DIAGNOSIS — Z483 Aftercare following surgery for neoplasm: Secondary | ICD-10-CM | POA: Insufficient documentation

## 2016-10-13 DIAGNOSIS — E119 Type 2 diabetes mellitus without complications: Secondary | ICD-10-CM | POA: Diagnosis not present

## 2016-10-13 DIAGNOSIS — J45909 Unspecified asthma, uncomplicated: Secondary | ICD-10-CM | POA: Diagnosis not present

## 2016-10-13 DIAGNOSIS — M25612 Stiffness of left shoulder, not elsewhere classified: Secondary | ICD-10-CM | POA: Diagnosis not present
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
## 2016-10-13 NOTE — Patient Instructions (Signed)
Just go straight down to Rt groin for now, daughter can help with behind armpit area.  Self manual lymph drainage: Perform this sequence once a day.  Only give enough pressure no your skin to make the skin move.  Diaphragmatic - Supine   Inhale through nose making navel move out toward hands. Exhale through puckered lips, hands follow navel in. Repeat _5__ times. Rest _10__ seconds between repeats.   Copyright  VHI. All rights reserved.  Hug yourself.  Do circles at your neck just above your collarbones.  Repeat this 10 times.  Axilla - One at a Time   Using full weight of flat hand and fingers at center of uninvolved armpit, make _10__ in-place circles.   Copyright  VHI. All rights reserved.  LEG: Inguinal Nodes Stimulation   With small finger side of hand against hip crease on involved side, gently perform circles at the crease. Repeat __10_ times.   Copyright  VHI. All rights reserved.  1) Axilla to Inguinal Nodes - Sweep   On involved side, sweep _4__ times from armpit along side of trunk to hip crease.  Now gently stretch skin from the involved side to the uninvolved side across the chest at the shoulder line.  Repeat that 4 times.  Draw an imaginary diagonal line from upper outer breast through the nipple area toward lower inner breast.  Direct fluid upward and inward from this line toward the pathway across your upper chest .  Do this in three rows to treat all of the upper inner breast tissue, and do each row 3-4x.      Direct fluid to treat all of lower outer breast tissue downward and outward toward      pathway that is aimed at the left groin.  Finish by doing the pathways as described above going from your involved armpit to the same side groin and going across your upper chest from the involved shoulder to the uninvolved shoulder.  Repeat the steps above where you do circles in your right groin and left armpit. Copyright  VHI. All rights reserved.

## 2016-10-13 NOTE — Patient Instructions (Addendum)
Continue to eat heart healthy diet (full of fruits, vegetables, whole grains, lean protein, water--limit salt, fat, and sugar intake) and increase physical activity as tolerated.  Continue doing brain stimulating activities (puzzles, reading, adult coloring books, staying active) to keep memory sharp.   Elizabeth Hawkins , Thank you for taking time to come for your Medicare Wellness Visit. I appreciate your ongoing commitment to your health goals. Please review the following plan we discussed and let me know if I can assist you in the future.   These are the goals we discussed: Goals    . lose 15 pounds          Exercise, start to walk, get back to being active in the community.        This is a list of the screening recommended for you and due dates:  Health Maintenance  Topic Date Due  . Flu Shot  11/05/2016*  . Pneumococcal vaccine (1) 10/12/2017*  . HIV Screening  10/13/2017*  . Mammogram  08/07/2018*  . Hemoglobin A1C  02/06/2017  . Pap Smear  05/01/2017  . Colon Cancer Screening  05/24/2017  . Eye exam for diabetics  06/16/2017  . Complete foot exam   08/09/2017  . Urine Protein Check  08/09/2017  . Tetanus Vaccine  08/09/2026  .  Hepatitis C: One time screening is recommended by Center for Disease Control  (CDC) for  adults born from [birth year] through 1964.   Completed  *Topic was postponed. The date shown is not the original due date.
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
## 2016-10-14 ENCOUNTER — Telehealth: Payer: Self-pay | Admitting: *Deleted

## 2016-10-14 NOTE — Addendum Note (Signed)
Addended by: Emelia Loron A on: 10/14/2016 03:00 PM   Modules accepted: Orders

## 2016-10-14 NOTE — Telephone Encounter (Signed)
Called patient to inform her that a shower bench was ordered and that [facility] should call her within a few days to ask about delivery.

## 2016-10-18 ENCOUNTER — Ambulatory Visit: Payer: Medicare Other

## 2016-10-20 ENCOUNTER — Ambulatory Visit: Payer: Medicare Other | Admitting: Physical Therapy

## 2016-10-20 DIAGNOSIS — R293 Abnormal posture: Secondary | ICD-10-CM

## 2016-10-20 DIAGNOSIS — M6281 Muscle weakness (generalized): Secondary | ICD-10-CM

## 2016-10-20 DIAGNOSIS — M25611 Stiffness of right shoulder, not elsewhere classified: Secondary | ICD-10-CM | POA: Diagnosis not present

## 2016-10-20 DIAGNOSIS — M25612 Stiffness of left shoulder, not elsewhere classified: Secondary | ICD-10-CM | POA: Diagnosis not present

## 2016-10-20 DIAGNOSIS — Z483 Aftercare following surgery for neoplasm: Secondary | ICD-10-CM | POA: Diagnosis not present

## 2016-10-20 NOTE — Patient Instructions (Signed)
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
## 2016-10-24 ENCOUNTER — Ambulatory Visit: Payer: Medicare Other

## 2016-10-24 DIAGNOSIS — Z483 Aftercare following surgery for neoplasm: Secondary | ICD-10-CM

## 2016-10-24 DIAGNOSIS — M25611 Stiffness of right shoulder, not elsewhere classified: Secondary | ICD-10-CM

## 2016-10-24 DIAGNOSIS — M25612 Stiffness of left shoulder, not elsewhere classified: Secondary | ICD-10-CM

## 2016-10-24 DIAGNOSIS — M6281 Muscle weakness (generalized): Secondary | ICD-10-CM

## 2016-10-24 DIAGNOSIS — R293 Abnormal posture: Secondary | ICD-10-CM | POA: Diagnosis not present
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
## 2016-10-24 NOTE — Patient Instructions (Signed)

## 2016-10-25 ENCOUNTER — Ambulatory Visit: Payer: Medicare Other

## 2016-10-25 DIAGNOSIS — Z483 Aftercare following surgery for neoplasm: Secondary | ICD-10-CM | POA: Diagnosis not present

## 2016-10-25 DIAGNOSIS — R293 Abnormal posture: Secondary | ICD-10-CM

## 2016-10-25 DIAGNOSIS — M6281 Muscle weakness (generalized): Secondary | ICD-10-CM | POA: Diagnosis not present

## 2016-10-25 DIAGNOSIS — M25612 Stiffness of left shoulder, not elsewhere classified: Secondary | ICD-10-CM | POA: Diagnosis not present

## 2016-10-25 DIAGNOSIS — M25611 Stiffness of right shoulder, not elsewhere classified: Secondary | ICD-10-CM

## 2016-10-25 NOTE — Patient Instructions (Signed)
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
## 2016-10-26 ENCOUNTER — Encounter: Payer: Self-pay | Admitting: Physical Therapy

## 2016-10-27 ENCOUNTER — Encounter: Payer: Self-pay | Admitting: Physical Therapy

## 2016-11-02 ENCOUNTER — Ambulatory Visit: Payer: Medicare Other

## 2016-11-02 DIAGNOSIS — M6281 Muscle weakness (generalized): Secondary | ICD-10-CM | POA: Diagnosis not present

## 2016-11-02 DIAGNOSIS — Z483 Aftercare following surgery for neoplasm: Secondary | ICD-10-CM

## 2016-11-02 DIAGNOSIS — M25612 Stiffness of left shoulder, not elsewhere classified: Secondary | ICD-10-CM | POA: Diagnosis not present

## 2016-11-02 DIAGNOSIS — M25611 Stiffness of right shoulder, not elsewhere classified: Secondary | ICD-10-CM

## 2016-11-02 DIAGNOSIS — R293 Abnormal posture: Secondary | ICD-10-CM | POA: Diagnosis not present
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
## 2016-11-03 ENCOUNTER — Ambulatory Visit: Payer: Medicare Other | Admitting: Physical Therapy

## 2016-11-03 ENCOUNTER — Encounter: Payer: Self-pay | Admitting: Physical Therapy

## 2016-11-03 DIAGNOSIS — R293 Abnormal posture: Secondary | ICD-10-CM | POA: Diagnosis not present

## 2016-11-03 DIAGNOSIS — M6281 Muscle weakness (generalized): Secondary | ICD-10-CM | POA: Diagnosis not present

## 2016-11-03 DIAGNOSIS — Z483 Aftercare following surgery for neoplasm: Secondary | ICD-10-CM | POA: Diagnosis not present

## 2016-11-03 DIAGNOSIS — M25611 Stiffness of right shoulder, not elsewhere classified: Secondary | ICD-10-CM | POA: Diagnosis not present

## 2016-11-03 DIAGNOSIS — M25612 Stiffness of left shoulder, not elsewhere classified: Secondary | ICD-10-CM | POA: Diagnosis not present
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
## 2016-11-15 ENCOUNTER — Ambulatory Visit: Payer: Medicare Other | Attending: General Surgery

## 2016-11-15 DIAGNOSIS — M25612 Stiffness of left shoulder, not elsewhere classified: Secondary | ICD-10-CM | POA: Insufficient documentation

## 2016-11-15 DIAGNOSIS — R293 Abnormal posture: Secondary | ICD-10-CM | POA: Diagnosis not present

## 2016-11-15 DIAGNOSIS — M25611 Stiffness of right shoulder, not elsewhere classified: Secondary | ICD-10-CM | POA: Insufficient documentation

## 2016-11-15 DIAGNOSIS — M6281 Muscle weakness (generalized): Secondary | ICD-10-CM | POA: Diagnosis not present

## 2016-11-15 DIAGNOSIS — Z483 Aftercare following surgery for neoplasm: Secondary | ICD-10-CM | POA: Diagnosis not present
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
## 2016-11-17 ENCOUNTER — Ambulatory Visit: Payer: Medicare Other | Admitting: Physical Therapy

## 2016-11-17 DIAGNOSIS — M25611 Stiffness of right shoulder, not elsewhere classified: Secondary | ICD-10-CM

## 2016-11-17 DIAGNOSIS — M25612 Stiffness of left shoulder, not elsewhere classified: Secondary | ICD-10-CM | POA: Diagnosis not present

## 2016-11-17 DIAGNOSIS — M6281 Muscle weakness (generalized): Secondary | ICD-10-CM | POA: Diagnosis not present

## 2016-11-17 DIAGNOSIS — Z483 Aftercare following surgery for neoplasm: Secondary | ICD-10-CM

## 2016-11-17 DIAGNOSIS — R293 Abnormal posture: Secondary | ICD-10-CM | POA: Diagnosis not present

## 2016-11-17 NOTE — Patient Instructions (Signed)
First of all, check with your insurance company to see if provider is in network   [facility]                                            [address]  [place], [place] [address] [phone]    Does not file for insurance--- call for appointment with Cathy  [facility]   (for wigs and compression sleeves / gloves/gauntlets )  [address] [place], [place] [address] [phone]  Will file some insurances --- call for appointment   [facility] (for mastectomy prosthetics and garments) [address] [place], [place] [address] [phone] Will file some insurances --- call for appointment  [facility]  [address]  , [place] [address] [phone] Lower extremity garments  [facility] [address] [place], [place]  [address] [phone]  [employer] Sales rep:  Matt Lawson:  [phone] [email or web address] Biocompression pumps   [employer]  Sales rep: Robert Rollins:  [phone] [email or web address] Entre and Flexitouch pumps    Other Resources: National Lymphedema Network:  www.lymphnet.org www.[email or web address] for patient articles and purchase a self manual lymph drainage DVD [email or web address] has informative articles.  
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
## 2016-11-21 ENCOUNTER — Ambulatory Visit: Payer: Medicare Other | Admitting: Physical Therapy

## 2016-11-21 DIAGNOSIS — M6281 Muscle weakness (generalized): Secondary | ICD-10-CM

## 2016-11-21 DIAGNOSIS — R293 Abnormal posture: Secondary | ICD-10-CM

## 2016-11-21 DIAGNOSIS — M25612 Stiffness of left shoulder, not elsewhere classified: Secondary | ICD-10-CM | POA: Diagnosis not present

## 2016-11-21 DIAGNOSIS — Z483 Aftercare following surgery for neoplasm: Secondary | ICD-10-CM

## 2016-11-21 DIAGNOSIS — M25611 Stiffness of right shoulder, not elsewhere classified: Secondary | ICD-10-CM
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
## 2016-11-22 ENCOUNTER — Ambulatory Visit: Payer: Medicare Other | Admitting: Physical Therapy

## 2016-11-22 DIAGNOSIS — Z483 Aftercare following surgery for neoplasm: Secondary | ICD-10-CM | POA: Diagnosis not present

## 2016-11-22 DIAGNOSIS — M25612 Stiffness of left shoulder, not elsewhere classified: Secondary | ICD-10-CM | POA: Diagnosis not present

## 2016-11-22 DIAGNOSIS — M25611 Stiffness of right shoulder, not elsewhere classified: Secondary | ICD-10-CM

## 2016-11-22 DIAGNOSIS — R293 Abnormal posture: Secondary | ICD-10-CM | POA: Diagnosis not present

## 2016-11-22 DIAGNOSIS — M6281 Muscle weakness (generalized): Secondary | ICD-10-CM | POA: Diagnosis not present
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
## 2016-11-25 ENCOUNTER — Ambulatory Visit: Payer: Medicare Other | Admitting: Physical Therapy

## 2017-01-31 ENCOUNTER — Other Ambulatory Visit: Payer: Self-pay | Admitting: Internal Medicine

## 2017-01-31 NOTE — Telephone Encounter (Signed)
Done hardcopy to [place]  

## 2017-02-01 NOTE — Telephone Encounter (Signed)
Faxed

## 2017-04-25 DIAGNOSIS — Z779 Other contact with and (suspected) exposures hazardous to health: Secondary | ICD-10-CM | POA: Diagnosis not present

## 2017-04-26 ENCOUNTER — Other Ambulatory Visit: Payer: Self-pay | Admitting: Obstetrics and Gynecology

## 2017-04-26 DIAGNOSIS — R1010 Upper abdominal pain, unspecified: Secondary | ICD-10-CM

## 2017-05-01 ENCOUNTER — Ambulatory Visit
Admission: RE | Admit: 2017-05-01 | Discharge: 2017-05-01 | Disposition: A | Discharge status: Home / Self Care | Payer: Medicare Other | Source: Ambulatory Visit | Attending: Obstetrics and Gynecology | Admitting: Obstetrics and Gynecology

## 2017-05-01 DIAGNOSIS — R1010 Upper abdominal pain, unspecified: Secondary | ICD-10-CM

## 2017-05-01 DIAGNOSIS — K449 Diaphragmatic hernia without obstruction or gangrene: Secondary | ICD-10-CM | POA: Diagnosis not present

## 2017-05-01 MED ORDER — IOPAMIDOL (ISOVUE-300) INJECTION 61%
125.0000 mL | Freq: Once | INTRAVENOUS | Status: AC | PRN
  Administered 2017-05-01: 125 mL via INTRAVENOUS

## 2017-05-05 ENCOUNTER — Ambulatory Visit: Payer: Self-pay | Admitting: General Surgery

## 2017-05-05 DIAGNOSIS — K439 Ventral hernia without obstruction or gangrene: Secondary | ICD-10-CM | POA: Diagnosis not present

## 2017-06-19 ENCOUNTER — Other Ambulatory Visit: Payer: Self-pay | Admitting: Internal Medicine

## 2017-06-20 DIAGNOSIS — H2513 Age-related nuclear cataract, bilateral: Secondary | ICD-10-CM | POA: Diagnosis not present

## 2017-06-20 DIAGNOSIS — E119 Type 2 diabetes mellitus without complications: Secondary | ICD-10-CM | POA: Diagnosis not present

## 2017-06-20 DIAGNOSIS — H43811 Vitreous degeneration, right eye: Secondary | ICD-10-CM | POA: Diagnosis not present

## 2017-07-05 ENCOUNTER — Ambulatory Visit: Admit: 2017-07-05 | Payer: Medicare Other | Admitting: General Surgery

## 2017-07-05 SURGERY — REPAIR, HERNIA, VENTRAL, LAPAROSCOPIC
Anesthesia: General

## 2017-07-12 ENCOUNTER — Encounter: Payer: Self-pay | Admitting: Internal Medicine

## 2017-07-28 NOTE — Patient Instructions (Addendum)
MICHILLE MCELRATH  07/28/2017   Your procedure is scheduled on: Wednesday, Jan. 2, 2019   Surgery Time:  8:30AM-10:00AM   Report to [facility]  elevators to 3rd floor to  [place] at 6:30 AM.     Call this number if you have problems the morning of surgery [phone]    Remember: ONLY 1 PERSON MAY GO WITH YOU TO SHORT STAY TO GET  READY MORNING OF [place].   Do not eat food or drink liquids :After Midnight.   Complete one Ensure drink the morning of surgery 3 hours prior to scheduled surgery.   Take these medicines the morning of surgery with A SIP OF WATER: Levothyroxine, Xanax if needed   Bring Asthma Inhaler day of surgery                               You may not have any metal on your body including hair pins, jewelry, and body piercings              Do not wear make-up, lotions, powders, perfumes,or deodorant             Do not wear nail polish.  Do not shave  48 hours prior to surgery.                Do not bring valuables to the hospital. [place].   Contacts, dentures or bridgework may not be worn into surgery.   Leave suitcase in the car. After surgery it may be brought to your room.              Please read over the following fact sheets you were given: _____________________________________________________________________     [facility] - Preparing for Surgery Before surgery, you can play an important role.  Because skin is not sterile, your skin needs to be as free of germs as possible.  You can reduce the number of germs on your skin by washing with CHG (chlorahexidine gluconate) soap before surgery.  CHG is an antiseptic cleaner which kills germs and bonds with the skin to continue killing germs even after washing. Please DO NOT use if you have an allergy to CHG or antibacterial soaps.  If your skin becomes reddened/irritated stop using the CHG and  inform your nurse when you arrive at Short Stay. Do not shave (including legs and underarms) for at least 48 hours prior to the first CHG shower.  You may shave your face/neck.  Please follow these instructions carefully:  1.  Shower with CHG Soap the night before surgery and the  morning of surgery.  2.  If you choose to wash your hair, wash your hair first as usual with your normal  shampoo.  3.  After you shampoo, rinse your hair and body thoroughly to remove the shampoo.                             4.  Use CHG as you would any other liquid soap.  You can apply chg directly to the skin and wash.  Gently with a scrungie or clean washcloth.  5.  Apply  the CHG Soap to your body ONLY FROM THE NECK DOWN.   Do   not use on face/ open                           Wound or open sores. Avoid contact with eyes, ears mouth and   genitals (private parts).                       Wash face,  Genitals (private parts) with your normal soap.             6.  Wash thoroughly, paying special attention to the area where your    surgery  will be performed.  7.  Thoroughly rinse your body with warm water from the neck down.  8.  DO NOT shower/wash with your normal soap after using and rinsing off the CHG Soap.                9.  Pat yourself dry with a clean towel.            10.  Wear clean pajamas.            11.  Place clean sheets on your bed the night of your first shower and do not  sleep with pets. Day of Surgery : Do not apply any lotions/deodorants the morning of surgery.  Please wear clean clothes to the hospital/surgery center.  FAILURE TO FOLLOW THESE INSTRUCTIONS MAY RESULT IN THE CANCELLATION OF YOUR SURGERY  PATIENT SIGNATURE_________________________________  NURSE SIGNATURE__________________________________  ________________________________________________________________________

## 2017-08-04 ENCOUNTER — Encounter (HOSPITAL_COMMUNITY)
Admission: RE | Admit: 2017-08-04 | Discharge: 2017-08-04 | Disposition: A | Discharge status: Home / Self Care | Payer: Medicare Other | Source: Ambulatory Visit | Attending: General Surgery | Admitting: General Surgery

## 2017-08-04 ENCOUNTER — Encounter (HOSPITAL_COMMUNITY): Payer: Self-pay

## 2017-08-04 ENCOUNTER — Other Ambulatory Visit: Payer: Self-pay

## 2017-08-04 DIAGNOSIS — Z01812 Encounter for preprocedural laboratory examination: Secondary | ICD-10-CM | POA: Insufficient documentation

## 2017-08-04 DIAGNOSIS — Z01810 Encounter for preprocedural cardiovascular examination: Secondary | ICD-10-CM | POA: Insufficient documentation

## 2017-08-04 HISTORY — DX: Hypothyroidism, unspecified: E03.9

## 2017-08-04 HISTORY — DX: Unspecified asthma, uncomplicated: J45.909

## 2017-08-04 LAB — CBC
HCT: 35.3 % — ABNORMAL LOW (ref 36.0–46.0)
Hemoglobin: 10.9 g/dL — ABNORMAL LOW (ref 12.0–15.0)
MCH: 23.5 pg — ABNORMAL LOW (ref 26.0–34.0)
MCHC: 30.9 g/dL (ref 30.0–36.0)
MCV: 76.2 fL — AB (ref 78.0–100.0)
PLATELETS: 204 10*3/uL (ref 150–400)
RBC: 4.63 MIL/uL (ref 3.87–5.11)
RDW: 14.9 % (ref 11.5–15.5)
WBC: 5.6 10*3/uL (ref 4.0–10.5)

## 2017-08-04 LAB — BASIC METABOLIC PANEL
Anion gap: 5 (ref 5–15)
BUN: 21 mg/dL — AB (ref 6–20)
CALCIUM: 8.8 mg/dL — AB (ref 8.9–10.3)
CHLORIDE: 107 mmol/L (ref 101–111)
CO2: 27 mmol/L (ref 22–32)
CREATININE: 0.78 mg/dL (ref 0.44–1.00)
GFR calc Af Amer: >60 mL/min (ref >60)
GFR calc non Af Amer: >60 mL/min (ref >60)
Glucose, Bld: 94 mg/dL (ref 65–99)
Potassium: 4.6 mmol/L (ref 3.5–5.1)
SODIUM: 139 mmol/L (ref 135–145)

## 2017-08-04 NOTE — Pre-Procedure Instructions (Signed)
CBC and BMP results 08/04/17 faxed to Dr. Excell Seltzer via epic.

## 2017-08-08 NOTE — Anesthesia Preprocedure Evaluation (Addendum)
Anesthesia Evaluation  Patient identified by MRN, date of birth, ID band Patient awake    Reviewed: Allergy & Precautions, NPO status , Patient's Chart, lab work & pertinent test results  Airway Mallampati: I  TM Distance: >3 FB Neck ROM: Full    Dental  (+) Missing, Chipped, Dental Advisory Given   Pulmonary neg pulmonary ROS,    breath sounds clear to auscultation       Cardiovascular hypertension, Pt. on medications (-) angina Rhythm:Regular Rate:Normal  '14 Stress: normal perfusion '15 ECHO: normal   Neuro/Psych Anxiety Depression negative neurological ROS     GI/Hepatic negative GI ROS, Neg liver ROS, S/p Roux-en-Y   Endo/Other  Hypothyroidism Morbid obesity  Renal/GU negative Renal ROS     Musculoskeletal  (+) Arthritis ,   Abdominal (+) + obese,   Peds  Hematology  (+) Blood dyscrasia (Hb 10.9), anemia ,   Anesthesia Other Findings Breast cancer  Reproductive/Obstetrics                            Anesthesia Physical Anesthesia Plan  ASA: II  Anesthesia Plan: General   Post-op Pain Management:    Induction: Intravenous  PONV Risk Score and Plan: 4 or greater and Ondansetron, Dexamethasone, Scopolamine patch - Pre-op and Midazolam  Airway Management Planned: Oral ETT  Additional Equipment:   Intra-op Plan:   Post-operative Plan: Extubation in OR  Informed Consent: I have reviewed the patients History and Physical, chart, labs and discussed the procedure including the risks, benefits and alternatives for the proposed anesthesia with the patient or authorized representative who has indicated his/her understanding and acceptance.   Dental advisory given  Plan Discussed with: CRNA and Surgeon  Anesthesia Plan Comments: (Plan routine monitors, GETA)        Anesthesia Quick Evaluation

## 2017-08-09 ENCOUNTER — Encounter (HOSPITAL_COMMUNITY): Payer: Self-pay | Admitting: Anesthesiology

## 2017-08-09 ENCOUNTER — Ambulatory Visit (HOSPITAL_COMMUNITY): Payer: Medicare Other | Admitting: Anesthesiology

## 2017-08-09 ENCOUNTER — Observation Stay (HOSPITAL_COMMUNITY)
Admission: RE | Admit: 2017-08-09 | Discharge: 2017-08-10 | Disposition: A | Discharge status: Home / Self Care | Payer: Medicare Other | Source: Ambulatory Visit | Attending: General Surgery | Admitting: General Surgery

## 2017-08-09 ENCOUNTER — Other Ambulatory Visit: Payer: Self-pay

## 2017-08-09 ENCOUNTER — Encounter (HOSPITAL_COMMUNITY)
Admission: RE | Disposition: A | Discharge status: Home / Self Care | Payer: Self-pay | Source: Ambulatory Visit | Attending: General Surgery

## 2017-08-09 DIAGNOSIS — E119 Type 2 diabetes mellitus without complications: Secondary | ICD-10-CM | POA: Insufficient documentation

## 2017-08-09 DIAGNOSIS — K432 Incisional hernia without obstruction or gangrene: Principal | ICD-10-CM | POA: Insufficient documentation

## 2017-08-09 DIAGNOSIS — Z888 Allergy status to other drugs, medicaments and biological substances status: Secondary | ICD-10-CM | POA: Diagnosis not present

## 2017-08-09 DIAGNOSIS — K439 Ventral hernia without obstruction or gangrene: Secondary | ICD-10-CM | POA: Diagnosis not present

## 2017-08-09 DIAGNOSIS — K219 Gastro-esophageal reflux disease without esophagitis: Secondary | ICD-10-CM | POA: Insufficient documentation

## 2017-08-09 DIAGNOSIS — G4730 Sleep apnea, unspecified: Secondary | ICD-10-CM | POA: Diagnosis not present

## 2017-08-09 DIAGNOSIS — Z9889 Other specified postprocedural states: Secondary | ICD-10-CM | POA: Insufficient documentation

## 2017-08-09 DIAGNOSIS — E039 Hypothyroidism, unspecified: Secondary | ICD-10-CM | POA: Diagnosis not present

## 2017-08-09 DIAGNOSIS — Z6836 Body mass index (BMI) 36.0-36.9, adult: Secondary | ICD-10-CM | POA: Diagnosis not present

## 2017-08-09 DIAGNOSIS — Z90710 Acquired absence of both cervix and uterus: Secondary | ICD-10-CM | POA: Insufficient documentation

## 2017-08-09 DIAGNOSIS — E7800 Pure hypercholesterolemia, unspecified: Secondary | ICD-10-CM | POA: Insufficient documentation

## 2017-08-09 DIAGNOSIS — M1990 Unspecified osteoarthritis, unspecified site: Secondary | ICD-10-CM | POA: Diagnosis not present

## 2017-08-09 DIAGNOSIS — Z79899 Other long term (current) drug therapy: Secondary | ICD-10-CM | POA: Diagnosis not present

## 2017-08-09 DIAGNOSIS — E785 Hyperlipidemia, unspecified: Secondary | ICD-10-CM | POA: Diagnosis not present

## 2017-08-09 DIAGNOSIS — Z87891 Personal history of nicotine dependence: Secondary | ICD-10-CM | POA: Insufficient documentation

## 2017-08-09 DIAGNOSIS — F329 Major depressive disorder, single episode, unspecified: Secondary | ICD-10-CM | POA: Insufficient documentation

## 2017-08-09 DIAGNOSIS — I10 Essential (primary) hypertension: Secondary | ICD-10-CM | POA: Insufficient documentation

## 2017-08-09 DIAGNOSIS — Z853 Personal history of malignant neoplasm of breast: Secondary | ICD-10-CM | POA: Diagnosis not present

## 2017-08-09 DIAGNOSIS — F419 Anxiety disorder, unspecified: Secondary | ICD-10-CM | POA: Insufficient documentation

## 2017-08-09 DIAGNOSIS — Z9013 Acquired absence of bilateral breasts and nipples: Secondary | ICD-10-CM | POA: Insufficient documentation

## 2017-08-09 DIAGNOSIS — Z9884 Bariatric surgery status: Secondary | ICD-10-CM | POA: Insufficient documentation

## 2017-08-09 HISTORY — PX: VENTRAL HERNIA REPAIR: SHX424

## 2017-08-09 SURGERY — REPAIR, HERNIA, VENTRAL, LAPAROSCOPIC
Anesthesia: General

## 2017-08-09 MED ORDER — EPHEDRINE 5 MG/ML INJ
INTRAVENOUS | Status: AC
  Filled 2017-08-09: qty 10

## 2017-08-09 MED ORDER — SUFENTANIL CITRATE 50 MCG/ML IV SOLN
INTRAVENOUS | Status: DC | PRN
  Administered 2017-08-09: 5 ug via INTRAVENOUS
  Administered 2017-08-09: 10 ug via INTRAVENOUS
  Administered 2017-08-09: 20 ug via INTRAVENOUS

## 2017-08-09 MED ORDER — GABAPENTIN 300 MG PO CAPS
300.0000 mg | ORAL_CAPSULE | Freq: Once | ORAL | Status: AC
  Administered 2017-08-09: 300 mg via ORAL
  Filled 2017-08-09: qty 1

## 2017-08-09 MED ORDER — EPHEDRINE SULFATE-NACL 50-0.9 MG/10ML-% IV SOSY
PREFILLED_SYRINGE | INTRAVENOUS | Status: DC | PRN
  Administered 2017-08-09: 5 mg via INTRAVENOUS

## 2017-08-09 MED ORDER — ROCURONIUM BROMIDE 10 MG/ML (PF) SYRINGE
PREFILLED_SYRINGE | INTRAVENOUS | Status: DC | PRN
  Administered 2017-08-09: 50 mg via INTRAVENOUS

## 2017-08-09 MED ORDER — LEVOTHYROXINE SODIUM 50 MCG PO TABS
50.0000 ug | ORAL_TABLET | Freq: Every day | ORAL | Status: DC
  Administered 2017-08-10: 50 ug via ORAL
  Filled 2017-08-09: qty 1

## 2017-08-09 MED ORDER — SCOPOLAMINE 1 MG/3DAYS TD PT72
MEDICATED_PATCH | TRANSDERMAL | Status: DC | PRN
  Administered 2017-08-09: 1 via TRANSDERMAL

## 2017-08-09 MED ORDER — POTASSIUM CHLORIDE 2 MEQ/ML IV SOLN
INTRAVENOUS | Status: DC
  Administered 2017-08-09 (×2): via INTRAVENOUS
  Filled 2017-08-09 (×2): qty 1000

## 2017-08-09 MED ORDER — BUPIVACAINE-EPINEPHRINE 0.25% -1:200000 IJ SOLN
INTRAMUSCULAR | Status: DC | PRN
  Administered 2017-08-09: 45 mL

## 2017-08-09 MED ORDER — CEFAZOLIN SODIUM-DEXTROSE 2-4 GM/100ML-% IV SOLN
INTRAVENOUS | Status: AC
  Filled 2017-08-09: qty 100

## 2017-08-09 MED ORDER — LIDOCAINE 2% (20 MG/ML) 5 ML SYRINGE
INTRAMUSCULAR | Status: DC | PRN
  Administered 2017-08-09: 100 mg via INTRAVENOUS

## 2017-08-09 MED ORDER — LIDOCAINE 2% (20 MG/ML) 5 ML SYRINGE
INTRAMUSCULAR | Status: DC | PRN
  Administered 2017-08-09: 1.5 mg/kg/h via INTRAVENOUS

## 2017-08-09 MED ORDER — ENOXAPARIN SODIUM 40 MG/0.4ML ~~LOC~~ SOLN
40.0000 mg | SUBCUTANEOUS | Status: DC
  Administered 2017-08-10: 40 mg via SUBCUTANEOUS
  Filled 2017-08-09: qty 0.4

## 2017-08-09 MED ORDER — MIDAZOLAM HCL 2 MG/2ML IJ SOLN
INTRAMUSCULAR | Status: DC | PRN
  Administered 2017-08-09: 2 mg via INTRAVENOUS

## 2017-08-09 MED ORDER — SUGAMMADEX SODIUM 200 MG/2ML IV SOLN
INTRAVENOUS | Status: AC
  Filled 2017-08-09: qty 2

## 2017-08-09 MED ORDER — MEPERIDINE HCL 50 MG/ML IJ SOLN: 6.2500 mg | INTRAMUSCULAR | Status: DC | PRN

## 2017-08-09 MED ORDER — SCOPOLAMINE 1 MG/3DAYS TD PT72
1.0000 | MEDICATED_PATCH | Freq: Once | TRANSDERMAL | Status: DC
  Administered 2017-08-09: 1.5 mg via TRANSDERMAL
  Filled 2017-08-09: qty 1

## 2017-08-09 MED ORDER — 0.9 % SODIUM CHLORIDE (POUR BTL) OPTIME
TOPICAL | Status: DC | PRN
  Administered 2017-08-09: 1000 mL

## 2017-08-09 MED ORDER — PROPOFOL 10 MG/ML IV BOLUS
INTRAVENOUS | Status: DC | PRN
  Administered 2017-08-09: 150 mg via INTRAVENOUS

## 2017-08-09 MED ORDER — PROMETHAZINE HCL 25 MG/ML IJ SOLN: 6.2500 mg | INTRAMUSCULAR | Status: DC | PRN

## 2017-08-09 MED ORDER — MIDAZOLAM HCL 2 MG/2ML IJ SOLN
INTRAMUSCULAR | Status: AC
  Filled 2017-08-09: qty 2

## 2017-08-09 MED ORDER — PROPOFOL 10 MG/ML IV BOLUS
INTRAVENOUS | Status: AC
  Filled 2017-08-09: qty 20

## 2017-08-09 MED ORDER — OXYCODONE HCL 5 MG PO TABS
5.0000 mg | ORAL_TABLET | ORAL | Status: DC | PRN
  Administered 2017-08-09 – 2017-08-10 (×4): 5 mg via ORAL
  Filled 2017-08-09 (×4): qty 1

## 2017-08-09 MED ORDER — CEFAZOLIN SODIUM-DEXTROSE 2-4 GM/100ML-% IV SOLN
2.0000 g | Freq: Once | INTRAVENOUS | Status: AC
Start: 2017-08-09 — End: 2017-08-09
  Administered 2017-08-09: 2 g via INTRAVENOUS

## 2017-08-09 MED ORDER — LIDOCAINE HCL 2 % IJ SOLN
INTRAMUSCULAR | Status: AC
  Filled 2017-08-09: qty 20

## 2017-08-09 MED ORDER — POLYETHYLENE GLYCOL 3350 17 G PO PACK
17.0000 g | PACK | Freq: Every day | ORAL | Status: DC
  Administered 2017-08-09 – 2017-08-10 (×2): 17 g via ORAL
  Filled 2017-08-09: qty 1

## 2017-08-09 MED ORDER — HYDROMORPHONE HCL 1 MG/ML IJ SOLN
0.2500 mg | INTRAMUSCULAR | Status: DC | PRN
  Administered 2017-08-09 (×2): 0.5 mg via INTRAVENOUS

## 2017-08-09 MED ORDER — LACTATED RINGERS IV SOLN
INTRAVENOUS | Status: DC | PRN
  Administered 2017-08-09: 08:00:00 via INTRAVENOUS

## 2017-08-09 MED ORDER — DEXAMETHASONE SODIUM PHOSPHATE 10 MG/ML IJ SOLN
INTRAMUSCULAR | Status: AC
  Filled 2017-08-09: qty 1

## 2017-08-09 MED ORDER — POLYETHYL GLYCOL-PROPYL GLYCOL 0.4-0.3 % OP GEL: Freq: Every day | OPHTHALMIC | Status: DC

## 2017-08-09 MED ORDER — SCOPOLAMINE 1 MG/3DAYS TD PT72
MEDICATED_PATCH | TRANSDERMAL | Status: AC
  Filled 2017-08-09: qty 1

## 2017-08-09 MED ORDER — SODIUM CHLORIDE 0.9 % IJ SOLN
INTRAMUSCULAR | Status: AC
  Filled 2017-08-09: qty 10

## 2017-08-09 MED ORDER — ACETAMINOPHEN 500 MG PO TABS
1000.0000 mg | ORAL_TABLET | Freq: Once | ORAL | Status: AC
  Administered 2017-08-09: 1000 mg via ORAL
  Filled 2017-08-09: qty 2

## 2017-08-09 MED ORDER — LIDOCAINE 2% (20 MG/ML) 5 ML SYRINGE
INTRAMUSCULAR | Status: AC
  Filled 2017-08-09: qty 5

## 2017-08-09 MED ORDER — MIDAZOLAM HCL 2 MG/2ML IJ SOLN: 0.5000 mg | Freq: Once | INTRAMUSCULAR | Status: DC | PRN

## 2017-08-09 MED ORDER — DEXAMETHASONE SODIUM PHOSPHATE 10 MG/ML IJ SOLN
INTRAMUSCULAR | Status: DC | PRN
  Administered 2017-08-09: 10 mg via INTRAVENOUS

## 2017-08-09 MED ORDER — ALBUTEROL SULFATE (2.5 MG/3ML) 0.083% IN NEBU: 3.0000 mL | INHALATION_SOLUTION | Freq: Four times a day (QID) | RESPIRATORY_TRACT | Status: DC | PRN

## 2017-08-09 MED ORDER — SUCCINYLCHOLINE CHLORIDE 200 MG/10ML IV SOSY
PREFILLED_SYRINGE | INTRAVENOUS | Status: AC
  Filled 2017-08-09: qty 10

## 2017-08-09 MED ORDER — ARTIFICIAL TEARS OPHTHALMIC OINT
TOPICAL_OINTMENT | Freq: Every day | OPHTHALMIC | Status: DC
  Administered 2017-08-10: 10:00:00 via OPHTHALMIC
  Filled 2017-08-09: qty 3.5

## 2017-08-09 MED ORDER — SUGAMMADEX SODIUM 200 MG/2ML IV SOLN
INTRAVENOUS | Status: DC | PRN
  Administered 2017-08-09: 200 mg via INTRAVENOUS

## 2017-08-09 MED ORDER — HYDROMORPHONE HCL 1 MG/ML IJ SOLN
INTRAMUSCULAR | Status: AC
  Administered 2017-08-09: 0.5 mg via INTRAVENOUS
  Filled 2017-08-09: qty 1

## 2017-08-09 MED ORDER — ALPRAZOLAM 0.25 MG PO TABS: 0.2500 mg | ORAL_TABLET | Freq: Every evening | ORAL | Status: DC | PRN

## 2017-08-09 MED ORDER — ONDANSETRON HCL 4 MG/2ML IJ SOLN
INTRAMUSCULAR | Status: DC | PRN
  Administered 2017-08-09: 4 mg via INTRAVENOUS

## 2017-08-09 MED ORDER — ACETAMINOPHEN 500 MG PO TABS
1000.0000 mg | ORAL_TABLET | Freq: Three times a day (TID) | ORAL | Status: DC | PRN
  Administered 2017-08-09: 1000 mg via ORAL
  Filled 2017-08-09: qty 2

## 2017-08-09 MED ORDER — MORPHINE SULFATE (PF) 2 MG/ML IV SOLN: 2.0000 mg | INTRAVENOUS | Status: DC | PRN

## 2017-08-09 MED ORDER — CELECOXIB 200 MG PO CAPS
400.0000 mg | ORAL_CAPSULE | Freq: Once | ORAL | Status: AC
  Administered 2017-08-09: 400 mg via ORAL
  Filled 2017-08-09: qty 2

## 2017-08-09 MED ORDER — BUPIVACAINE-EPINEPHRINE 0.25% -1:200000 IJ SOLN
INTRAMUSCULAR | Status: AC
  Filled 2017-08-09: qty 1

## 2017-08-09 MED ORDER — ONDANSETRON HCL 4 MG/2ML IJ SOLN
INTRAMUSCULAR | Status: AC
  Filled 2017-08-09: qty 2

## 2017-08-09 MED ORDER — SUFENTANIL CITRATE 50 MCG/ML IV SOLN
INTRAVENOUS | Status: AC
Start: 2017-08-09 — End: ?
  Filled 2017-08-09: qty 1

## 2017-08-09 SURGICAL SUPPLY — 46 items
APPLIER CLIP 5 13 M/L LIGAMAX5 (MISCELLANEOUS)
BENZOIN TINCTURE PRP APPL 2/3 (GAUZE/BANDAGES/DRESSINGS) IMPLANT
BINDER ABDOMINAL 12 ML 46-62 (SOFTGOODS) IMPLANT
CHLORAPREP W/TINT 26ML (MISCELLANEOUS) ×3 IMPLANT
CLIP APPLIE 5 13 M/L LIGAMAX5 (MISCELLANEOUS) IMPLANT
CLOSURE WOUND 1/2 X4 (GAUZE/BANDAGES/DRESSINGS)
COVER SURGICAL LIGHT HANDLE (MISCELLANEOUS) ×3 IMPLANT
DECANTER SPIKE VIAL GLASS SM (MISCELLANEOUS) ×3 IMPLANT
DERMABOND ADVANCED (GAUZE/BANDAGES/DRESSINGS) ×2
DERMABOND ADVANCED .7 DNX12 (GAUZE/BANDAGES/DRESSINGS) ×1 IMPLANT
DEVICE SECURE STRAP 25 ABSORB (INSTRUMENTS) ×3 IMPLANT
DEVICE TROCAR PUNCTURE CLOSURE (ENDOMECHANICALS) ×3 IMPLANT
DISSECTOR BLUNT TIP ENDO 5MM (MISCELLANEOUS) IMPLANT
DRSG OPSITE POSTOP 4X6 (GAUZE/BANDAGES/DRESSINGS) IMPLANT
DRSG OPSITE POSTOP 4X8 (GAUZE/BANDAGES/DRESSINGS) IMPLANT
ELECT PENCIL ROCKER SW 15FT (MISCELLANEOUS) ×3 IMPLANT
ELECT REM PT RETURN 15FT ADLT (MISCELLANEOUS) ×3 IMPLANT
GLOVE BIOGEL PI IND STRL 7.0 (GLOVE) ×1 IMPLANT
GLOVE BIOGEL PI IND STRL 7.5 (GLOVE) ×1 IMPLANT
GLOVE BIOGEL PI INDICATOR 7.0 (GLOVE) ×2
GLOVE BIOGEL PI INDICATOR 7.5 (GLOVE) ×2
GLOVE ECLIPSE 7.5 STRL STRAW (GLOVE) ×3 IMPLANT
GOWN STRL REUS W/TWL XL LVL3 (GOWN DISPOSABLE) ×9 IMPLANT
KIT BASIN OR (CUSTOM PROCEDURE TRAY) ×3 IMPLANT
MARKER SKIN DUAL TIP RULER LAB (MISCELLANEOUS) ×3 IMPLANT
MESH VENTRALIGHT ST 4.5IN (Mesh General) ×3 IMPLANT
NEEDLE SPNL 22GX3.5 QUINCKE BK (NEEDLE) ×3 IMPLANT
PAD POSITIONING PINK XL (MISCELLANEOUS) IMPLANT
SCISSORS LAP 5X35 DISP (ENDOMECHANICALS) ×3 IMPLANT
SET IRRIG TUBING LAPAROSCOPIC (IRRIGATION / IRRIGATOR) IMPLANT
SHEARS HARMONIC ACE PLUS 36CM (ENDOMECHANICALS) IMPLANT
SLEEVE ADV FIXATION 5X100MM (TROCAR) ×6 IMPLANT
STRIP CLOSURE SKIN 1/2X4 (GAUZE/BANDAGES/DRESSINGS) IMPLANT
SUT MNCRL AB 4-0 PS2 18 (SUTURE) ×3 IMPLANT
SUT NOVA NAB GS-21 0 18 T12 DT (SUTURE) ×9 IMPLANT
SUT PROLENE 0 CT 1 CR/8 (SUTURE) IMPLANT
SUT VIC AB 3-0 SH 27 (SUTURE) ×2
SUT VIC AB 3-0 SH 27XBRD (SUTURE) ×1 IMPLANT
SYSTEM WECK SHIELD CLOSURE (TROCAR) IMPLANT
TACKER 5MM HERNIA 3.5CML NAB (ENDOMECHANICALS) IMPLANT
TOWEL OR 17X26 10 PK STRL BLUE (TOWEL DISPOSABLE) ×3 IMPLANT
TRAY FOLEY W/METER SILVER 16FR (SET/KITS/TRAYS/PACK) IMPLANT
TRAY LAPAROSCOPIC (CUSTOM PROCEDURE TRAY) ×3 IMPLANT
TROCAR BLADELESS OPT 5 100 (ENDOMECHANICALS) ×3 IMPLANT
TROCAR XCEL NON-BLD 11X100MML (ENDOMECHANICALS) IMPLANT
TUBING INSUF HEATED (TUBING) ×3 IMPLANT

## 2017-08-09 NOTE — Interval H&P Note (Signed)
History and Physical Interval Note:  08/09/2017 8:15 AM  Elizabeth Hawkins  has presented today for surgery, with the diagnosis of VENTRAL HERNIA  The various methods of treatment have been discussed with the patient and family. After consideration of risks, benefits and other options for treatment, the patient has consented to  Procedure(s): [place] (N/A) as a surgical intervention .  The patient's history has been reviewed, patient examined, no change in status, stable for surgery.  I have reviewed the patient's chart and labs.  Questions were answered to the patient's satisfaction.     Darene Lamer Pallavi Clifton

## 2017-08-09 NOTE — Transfer of Care (Signed)
Immediate Anesthesia Transfer of Care Note  Patient: Elizabeth Hawkins  Procedure(s) Performed: LAPAROSCOPIC REPAIR VENTRAL HERNIA (N/A )  Patient Location: PACU  Anesthesia Type:General  Level of Consciousness: awake and alert   Airway & Oxygen Therapy: Patient Spontanous Breathing and Patient connected to face mask oxygen  Post-op Assessment: Report given to RN and Post -op Vital signs reviewed and stable  Post vital signs: Reviewed and stable  Last Vitals:  Vitals:   08/09/17 0633  BP: (!) 164/84  Pulse: 95  Resp: 18  Temp: 37 C  SpO2: 100%    Last Pain:  Vitals:   08/09/17 0633  TempSrc: Oral      Patients Stated Pain Goal: 4 (96/78/93 8101)  Complications: No apparent anesthesia complications

## 2017-08-09 NOTE — Anesthesia Postprocedure Evaluation (Signed)
Anesthesia Post Note  Patient: Elizabeth Hawkins  Procedure(s) Performed: LAPAROSCOPIC REPAIR VENTRAL HERNIA (N/A )     Patient location during evaluation: PACU Anesthesia Type: General Level of consciousness: awake and alert, patient cooperative and oriented Pain management: pain level controlled Vital Signs Assessment: post-procedure vital signs reviewed and stable Respiratory status: spontaneous breathing, nonlabored ventilation and respiratory function stable Cardiovascular status: blood pressure returned to baseline and stable Postop Assessment: no apparent nausea or vomiting Anesthetic complications: no    Last Vitals:  Vitals:   08/09/17 1205 08/09/17 1330  BP: (!) 157/82 (!) 144/89  Pulse: 69 64  Resp: 16 14  Temp: 36.8 C 36.7 C  SpO2: 100% 100%    Last Pain:  Vitals:   08/09/17 1330  TempSrc: Oral  PainSc:                  [name],E. Ivery Michalski

## 2017-08-09 NOTE — Op Note (Signed)
Preoperative Diagnosis: VENTRAL HERNIA  Postoprative Diagnosis: VENTRAL HERNIA  Procedure: Procedure(s): LAPAROSCOPIC REPAIR VENTRAL HERNIA   Surgeon: Excell Seltzer T   Assistants: None  Anesthesia:  General endotracheal anesthesia  Indications: Patient is a 62 year old female status post laparoscopic gastric bypass with primary repair of a supraumbilical ventral hernia at that time.  She has had a not unexpected recurrence but has had very successful weight loss and now presents for repair of her enlarging symptomatic recurrent ventral hernia.    Procedure Detail: Patient was brought to the operating room, placed in the supine position on the operating table, and general endotracheal anesthesia induced.  PAS were in place.  The abdomen was widely sterilely prepped and draped.  She had received preoperative IV antibiotics.  Patient timeout was performed and correct procedure verified.  Access was obtained with a 5 mm Optiview trocar in the left upper quadrant without difficulty.  There was no evidence of trocar injury.  Pneumoperitoneum was established.  Under direct vision to further 5 mm trochars were placed laterally in the left mid abdomen and left lower quadrant.  There was omentum chronically incarcerated in the hernia defect which was carefully taken down with hook cautery.  There were no bowel adhesions.  The defect was circular and measured about 4 cm in diameter.  I chose an 11 cm diameter piece of coated Prolene mesh for repair.  4  0 Novafil sutures were placed circumferentially around the mesh for tacking.  Corresponding sites on the abdominal wall for the sutures were identified and small stab incisions made.  I then made an infraumbilical incision transversely and dissected down through the subcutaneous tissue.  The hernia sac was encountered and opened and the hernia sac excised.  The fascial edges were cleared and identified in all directions.  The mesh was introduced into the  abdominal cavity through this incision.  I then closed the fascia transversely with interrupted 0 Novafil.  The abdomen was reinsufflated.  The appropriate sutures were then brought up through the anterior abdominal wall with the Endo Close device with nice broad deployment of the mesh in all directions around the repair.  These were tied and the mesh was then further secured circumferentially and with an inner row of the secure strap tacker.  This appeared to provide nice broad coverage over the fascial repair.  The abdomen was inspected for hemostasis or injury and everything looked fine.  All CO2 was evacuated and trochars removed.  The infraumbilical incision was closed with subcutaneous 3-0 Vicryl and subcuticular 5-0 Monocryl and trocar sites closed with subarticular 5-0 Monocryl.  Dermabond applied to all incisions.  Sponge needle and instrument counts were correct.    Findings: As above  Estimated Blood Loss:  Minimal         Drains: None  Blood Given: none          Specimens: None        Complications:  * No complications entered in OR log *         Disposition: PACU - hemodynamically stable.         Condition: stable

## 2017-08-09 NOTE — Anesthesia Procedure Notes (Signed)
Procedure Name: Intubation Date/Time: 08/09/2017 8:31 AM Performed by: Sharlette Dense, CRNA Patient Re-evaluated:Patient Re-evaluated prior to induction Oxygen Delivery Method: Circle system utilized Preoxygenation: Pre-oxygenation with 100% oxygen Induction Type: IV induction Ventilation: Mask ventilation without difficulty and Oral airway inserted - appropriate to patient size Laryngoscope Size: Sabra Heck and 2 Grade View: Grade I Tube type: Oral Tube size: 7.5 mm Number of attempts: 1 Airway Equipment and Method: Stylet Placement Confirmation: ETT inserted through vocal cords under direct vision,  positive ETCO2 and breath sounds checked- equal and bilateral Secured at: 22 cm Tube secured with: Tape Dental Injury: Teeth and Oropharynx as per pre-operative assessment

## 2017-08-09 NOTE — H&P (Signed)
History of Present Illness  The patient is a 62 year old female who presents with an abdominal wall hernia. Patient returns to the office due to new diagnosis of an abdominal wall hernia. 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 She has had good weight loss and maintains her weight at about 200 pounds. Also had extensive DCIS of the right breast is about 8 months status post bilateral mastectomy. She recently noted a lump or bulge just above her umbilicus. She was seen by her GYN physician Dr. Matthew Saras and there was concern of a hernia. CT scan was obtained which showed typical post-bypass changes but also a moderate sized anterior abdominal wall hernia just above the umbilicus containing omental fat but no bowel at the time of the CT scan. Just a day or 2 before the CT scan she did develop fairly significant acute pain at the hernia site that lasted about a day. Now remains more sore and tender and she feels she has difficulty eating due to some pain and nausea again with pain at the hernia site. She had a supraumbilical hernia noted at the time of her gastric bypass which was closed with sutures and no mesh due to the GI surgery.   Problem List/Past Medical  MALNUTRITION FOLLOWING GASTROINTESTINAL SURGERY (K91.2)  ABNORMAL MRI, BREAST (R92.8)  MORBID OBESITY (E66.01)  GASTRIC BYPASS STATUS FOR OBESITY (Z98.84)  RECURRENT SEROMA OF BREAST (N64.89)  DUCTAL CARCINOMA IN SITU (DCIS) OF RIGHT BREAST (D05.11)  DISCHARGE FROM LEFT NIPPLE (N64.52)  CHANGE OF DRESSING (Z48.00)   Past Surgical History  Hysterectomy (not due to cancer) - Complete  Knee Surgery  Bilateral.  Diagnostic Studies History  Mammogram  within last year Colonoscopy  1-5 years ago Pap Smear  1-5 years ago  Allergies  Clindamycin HCl (Bulk) *CHEMICALS*  Allergies Reconciled   Medication History  Illusions C Breast Prosthesis (1 (one)) Active. (L8000  Post-surgical bras - to hold breast prosthesis ; QTY: 7; Length of use:3 Months 3 Refills ; Replacement: Clean bra daily; M7672 Silicone Breast Prosthesis - to restore balance and symmetry after breast surgery; QTY: 2 for Bilateral; Length of use: 2 years No refills ; Replacement: Every day use) Vitamin C (500MG  Tablet, Oral DAily) Active. Vitamin D (Cholecalciferol) (1000UNIT Capsule, Oral) Active. Vitamin B12 (1000MCG Tablet ER, Oral) Active. Ondansetron (4MG  Tablet Disint, Oral as needed) Active. ALPRAZolam (0.25MG  Tablet, Oral as needed) Active. Fluconazole (150MG  Tablet, Oral daily) Active. Albuterol Sulfate (108 (90 Base)MCG/ACT Aero Pow Br Act, Inhalation as needed) Active. Levothyroxine Sodium (50MCG Tablet, Oral) Active. Medications Reconciled  Social History  Alcohol use  Occasional alcohol use. Caffeine use  Tea. No drug use  Tobacco use  Former smoker.  Family History  Heart Disease  Family Members In General, Mother. Respiratory Condition  Brother. Diabetes Mellitus  Daughter. Hypertension  Father, Mother. Heart disease in female family member before age 43  Cancer  Brother. Arthritis  Mother.  Pregnancy / Birth History  Para  1 Gravida  2 Age of menopause  60-50 Maternal age  75-30 Irregular periods  Age at menarche  53 years.  Other Problems  Diabetes Mellitus  Back Pain  Arthritis  Gastroesophageal Reflux Disease  High blood pressure  Hypercholesterolemia  Sleep Apnea   Vitals  05/05/2017 3:06 PM Weight: 203 lb Height: 64in Body Surface Area: 1.97 m Body Mass Index: 34.84 kg/m  Temp.: 97.62F  Pulse: 73 (Regular)  BP: 120/82 (Sitting, Left Arm,  Standard)       Physical Exam  The physical exam findings are as follows: Note:General: Alert, well-developed and well nourished African-American female, in no distress Skin: Warm and dry without rash or infection. HEENT: No palpable masses or thyromegaly.  Sclera nonicteric. Pupils equal round and reactive. Lymph nodes: No cervical, supraclavicular, or axillary nodes palpable. Chest wall: Status post bilateral mastectomy with well-healed incisions and no skin changes or masses Lungs: Breath sounds clear and equal. No wheezing or increased work of breathing. Cardiovascular: Regular rate and rhythm without murmer. No JVD or edema. Abdomen: Nondistended. Soft and nontender. There is a moderate sized reducible minimally tender supraumbilical hernia. No other palpable hernias. Extremities: No edema or joint swelling or deformity. No chronic venous stasis changes. Neurologic: Alert and fully oriented. Gait normal. No focal weakness. Psychiatric: Normal mood and affect. Thought content appropriate with normal judgement and insight    Assessment & Plan VENTRAL HERNIA WITHOUT OBSTRUCTION OR GANGRENE (K43.9) Impression: Patient status post gastric bypass with very good weight loss. Known previous supraumbilical hernia that was suture repaired to prevent postoperative incarceration but of course could not use mesh at the time of her bypass. She now has a symptomatic enlarging hernia. I recommended repair laparoscopically with mesh and anterior suture closure of the fascia. I discussed the nature of the problem and surgery with the patient. Discussed indications and expected recovery. Risks of surgery including anesthetic complications, medication reactions, bleeding, infection, bowel injury, recurrent hernia were all discussed and understood. All questions were answered and she would like to proceed.  Current Plans  Laparoscopic repair of ventral hernia with mesh under general anesthesia with overnight hospitalization.

## 2017-08-10 DIAGNOSIS — G4730 Sleep apnea, unspecified: Secondary | ICD-10-CM | POA: Diagnosis not present

## 2017-08-10 DIAGNOSIS — K219 Gastro-esophageal reflux disease without esophagitis: Secondary | ICD-10-CM | POA: Diagnosis not present

## 2017-08-10 DIAGNOSIS — I10 Essential (primary) hypertension: Secondary | ICD-10-CM | POA: Diagnosis not present

## 2017-08-10 DIAGNOSIS — E119 Type 2 diabetes mellitus without complications: Secondary | ICD-10-CM | POA: Diagnosis not present

## 2017-08-10 DIAGNOSIS — E7800 Pure hypercholesterolemia, unspecified: Secondary | ICD-10-CM | POA: Diagnosis not present

## 2017-08-10 DIAGNOSIS — K432 Incisional hernia without obstruction or gangrene: Secondary | ICD-10-CM | POA: Diagnosis not present

## 2017-08-10 MED ORDER — OXYCODONE HCL 5 MG PO TABS: 5.0000 mg | ORAL_TABLET | Freq: Four times a day (QID) | ORAL | 0 refills | Status: DC | PRN

## 2017-08-10 NOTE — Progress Notes (Signed)
Discharge orders discussed with patient and family, verbalized agreement and understanding, prescripton given to patient

## 2017-08-10 NOTE — Discharge Instructions (Signed)
[facility] ______[facility] °LAPAROSCOPIC SURGERY: POST OP INSTRUCTIONS °Always review your discharge instruction sheet given to you by the facility where your surgery was performed. °IF YOU HAVE DISABILITY OR FAMILY LEAVE FORMS, YOU MUST BRING THEM TO THE OFFICE FOR PROCESSING.   °DO NOT GIVE THEM TO YOUR DOCTOR. ° °1. A prescription for pain medication may be given to you upon discharge.  Take your pain medication as prescribed, if needed.  If narcotic pain medicine is not needed, then you may take acetaminophen (Tylenol) or ibuprofen (Advil) as needed. °2. Take your usually prescribed medications unless otherwise directed. °3. If you need a refill on your pain medication, please contact your pharmacy.  They will contact our office to request authorization. Prescriptions will not be filled after 5pm or on week-ends. °4. You should follow a light diet the first few days after arrival home, such as soup and crackers, etc.  Be sure to include lots of fluids daily. °5. Most patients will experience some swelling and bruising in the area of the incisions.  Ice packs will help.  Swelling and bruising can take several days to resolve.  °6. It is common to experience some constipation if taking pain medication after surgery.  Increasing fluid intake and taking a stool softener (such as Colace) will usually help or prevent this problem from occurring.  A mild laxative (Milk of Magnesia or Miralax) should be taken according to package instructions if there are no bowel movements after 48 hours. °7. Unless discharge instructions indicate otherwise, you may remove your bandages 24-48 hours after surgery, and you may shower at that time.  You may have steri-strips (small skin tapes) in place directly over the incision.  These strips should be left on the skin for 7-10 days.  If your surgeon used skin glue on the incision, you may shower in 24 hours.  The glue will flake off over the next 2-3 weeks.  Any sutures or  staples will be removed at the office during your follow-up visit. °8. ACTIVITIES:  You may resume regular (light) daily activities beginning the next day--such as daily self-care, walking, climbing stairs--gradually increasing activities as tolerated.  You may have sexual intercourse when it is comfortable.  Refrain from any heavy lifting or straining until approved by your doctor. °a. You may drive when you are no longer taking prescription pain medication, you can comfortably wear a seatbelt, and you can safely maneuver your car and apply brakes. °b. RETURN TO WORK:  __________________________________________________________ °9. You should see your doctor in the office for a follow-up appointment approximately 2-3 weeks after your surgery.  Make sure that you call for this appointment within a day or two after you arrive home to insure a convenient appointment time. °10. OTHER INSTRUCTIONS: __________________________________________________________________________________________________________________________ __________________________________________________________________________________________________________________________ °WHEN TO CALL YOUR DOCTOR: °1. Fever over 101.0 °2. Inability to urinate °3. Continued bleeding from incision. °4. Increased pain, redness, or drainage from the incision. °5. Increasing abdominal pain ° °The clinic staff is available to answer your questions during regular business hours.  Please don’t hesitate to call and ask to speak to one of the nurses for clinical concerns.  If you have a medical emergency, go to the nearest emergency room or call 911.  A surgeon from [facility] is always on call at the hospital. °[address], [address], [place], [place]  [address] ? [address] [address], , [place]   [address] °[phone] ? [phone] ? FAX [phone] °Web site:   [email or web address] °

## 2017-08-10 NOTE — Discharge Summary (Signed)
  Patient ID: Elizabeth Hawkins [id number] 62 y.o. [birth year]  08/09/2017  Discharge date and time: 08/10/2017   Admitting Physician: Edward Jolly  Discharge Physician: Edward Jolly  Admission Diagnoses: VENTRAL HERNIA  Discharge Diagnoses: Same  Operations: Procedure(s): [place]  Admission Condition: good  Discharged Condition: good  Indication for Admission: Patient has a long-standing supraumbilical ventral hernia.  She underwent gastric bypass approximately a year ago with temporary repair of the hernia with plans for definitive repair after weight loss.  She has had excellent weight loss from her surgery but continues to have significant symptoms from expected recurrent ventral hernia above the umbilicus.  She is electively admitted for repair.  We have discussed the procedure and risks extensively detailed elsewhere.  Hospital Course: On the morning of admission the patient underwent an uneventful laparoscopic assisted repair of her recurrent ventral hernia with mesh.  The following morning she has some expected pain when getting up and down but otherwise doing well.  Exam is unremarkable and incisions clean and dry.  She is felt ready for discharge.   Disposition: Home  Patient Instructions:  Allergies as of 08/10/2017   No Known Allergies     Medication List    TAKE these medications   acetaminophen 500 MG tablet Commonly known as:  TYLENOL Take 1,000 mg by mouth every 8 (eight) hours as needed for headache.   albuterol 108 (90 Base) MCG/ACT inhaler Commonly known as:  PROVENTIL HFA;VENTOLIN HFA Inhale 2 puffs into the lungs every 6 (six) hours as needed for wheezing or shortness of breath.   ALPRAZolam 0.25 MG tablet Commonly known as:  XANAX TAKE 1 TABLET BY MOUTH AT BEDTIME AS NEEDED FOR ANXIETY OR SLEEP   B-12 2500 MCG Tabs Take 2,500 mcg by mouth daily.   cholecalciferol 1000 units tablet Commonly known as:  VITAMIN  D Take 1,000 Units by mouth daily.   levothyroxine 50 MCG tablet Commonly known as:  SYNTHROID, LEVOTHROID TAKE 0.5 TABLETS (25 MCG TOTAL) BY MOUTH DAILY BEFORE BREAKFAST.   Magnesium Citrate 200 MG Tabs Take 200 mg by mouth daily.   ondansetron 4 MG disintegrating tablet Commonly known as:  ZOFRAN-ODT Take 1 tablet (4 mg total) by mouth every 6 (six) hours as needed for nausea.   oxyCODONE 5 MG immediate release tablet Commonly known as:  Oxy IR/ROXICODONE Take 1 tablet (5 mg total) by mouth every 6 (six) hours as needed for moderate pain.   polyethylene glycol packet Commonly known as:  MIRALAX / GLYCOLAX Take 17 g by mouth daily.   SYSTANE OP Apply 1 drop to eye daily.   vitamin C 1000 MG tablet Take 1,000 mg by mouth daily.       Activity: no heavy lifting for 4 weeks Diet: Post gastric bypass Wound Care: none needed  Follow-up:  With Dr. Excell Seltzer in 2 weeks.  Signed: Edward Jolly MD, FACS  08/10/2017, 8:46 AM

## 2017-08-10 NOTE — Progress Notes (Signed)
Patient ID: Elizabeth Hawkins, female   DOB: [birth year], 62 y.o.   MRN: [id number] 1 Day Post-Op   Subjective: Incisional pain when getting up and down.  Also IV infiltrated right wrist with some localized pain and swelling.  Otherwise doing well.  Has been walking in the halls.  Objective: Vital signs in last 24 hours: Temp:  [97.7 F (36.5 C)-98.8 F (37.1 C)] 98.1 F (36.7 C) (01/03 0555) Pulse Rate:  [62-93] 67 (01/03 0555) Resp:  [14-20] 18 (01/03 0555) BP: (124-168)/(65-94) 124/77 (01/03 0555) SpO2:  [98 %-100 %] 100 % (01/03 0555)    Intake/Output from previous day: 01/02 0701 - 01/03 0700 In: 2358.8 [P.O.:903; I.V.:1355.8; IV Piggyback:100] Out: 1600 [Urine:1575; Blood:25] Intake/Output this shift: Total I/O In: 240 [P.O.:240] Out: 400 [Urine:400]  General appearance: alert, cooperative and no distress GI: Mild incisional tenderness.  Nondistended. Extremities: Mild bruising and swelling right wrist at previous IV site without erythema Incision/Wound: Clean and dry  Lab Results:  No results for input(s): WBC, HGB, HCT, PLT in the last 72 hours. BMET No results for input(s): NA, K, CL, CO2, GLUCOSE, BUN, CREATININE, CALCIUM in the last 72 hours.   Studies/Results: No results found.  Anti-infectives: Anti-infectives (From admission, onward)   Start     Dose/Rate Route Frequency Ordered Stop   08/09/17 0803  ceFAZolin (ANCEF) 2-4 GM/100ML-% IVPB    Comments:  Danley Danker   : cabinet override      08/09/17 0803 08/09/17 0836   08/09/17 0715  ceFAZolin (ANCEF) IVPB 2g/100 mL premix     2 g 200 mL/hr over 30 Minutes Intravenous  Once 08/09/17 0709 08/09/17 0851      Assessment/Plan: s/p Procedure(s): LAPAROSCOPIC REPAIR VENTRAL HERNIA Generally doing well postoperative.  No complication apparent and I think she is stable for discharge.   LOS: 0 days    Edward Jolly 08/10/2017

## 2017-08-26 ENCOUNTER — Encounter: Payer: Self-pay | Admitting: Family Medicine

## 2017-08-26 ENCOUNTER — Ambulatory Visit (INDEPENDENT_AMBULATORY_CARE_PROVIDER_SITE_OTHER): Payer: Medicare Other | Admitting: Family Medicine

## 2017-08-26 VITALS — BP 132/84 | HR 88 | Temp 98.3°F | Wt 211.0 lb

## 2017-08-26 DIAGNOSIS — B349 Viral infection, unspecified: Secondary | ICD-10-CM | POA: Diagnosis not present

## 2017-08-26 DIAGNOSIS — G44211 Episodic tension-type headache, intractable: Secondary | ICD-10-CM

## 2017-08-26 MED ORDER — TRAMADOL HCL 50 MG PO TABS: ORAL_TABLET | ORAL | 0 refills | Status: DC
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
## 2017-09-14 ENCOUNTER — Other Ambulatory Visit: Payer: Self-pay | Admitting: Internal Medicine

## 2017-09-14 NOTE — Telephone Encounter (Signed)
Xanax done erx for 1 mo  Please ask pt to make ROV for further refills as last visit was jan 2018

## 2017-09-19 DIAGNOSIS — E119 Type 2 diabetes mellitus without complications: Secondary | ICD-10-CM | POA: Diagnosis not present

## 2017-09-19 DIAGNOSIS — Z9884 Bariatric surgery status: Secondary | ICD-10-CM | POA: Diagnosis not present

## 2017-09-19 DIAGNOSIS — K912 Postsurgical malabsorption, not elsewhere classified: Secondary | ICD-10-CM | POA: Diagnosis not present

## 2017-09-22 ENCOUNTER — Encounter: Payer: Self-pay | Admitting: Family

## 2017-09-22 ENCOUNTER — Telehealth: Payer: Self-pay | Admitting: Internal Medicine

## 2017-09-22 ENCOUNTER — Ambulatory Visit (INDEPENDENT_AMBULATORY_CARE_PROVIDER_SITE_OTHER): Payer: Medicare Other | Admitting: Family

## 2017-09-22 ENCOUNTER — Other Ambulatory Visit: Payer: Self-pay | Admitting: Family

## 2017-09-22 VITALS — BP 128/82 | HR 66 | Temp 98.7°F | Ht 64.0 in | Wt 212.0 lb

## 2017-09-22 DIAGNOSIS — R0602 Shortness of breath: Secondary | ICD-10-CM

## 2017-09-22 DIAGNOSIS — R05 Cough: Secondary | ICD-10-CM | POA: Diagnosis not present

## 2017-09-22 DIAGNOSIS — J019 Acute sinusitis, unspecified: Secondary | ICD-10-CM

## 2017-09-22 DIAGNOSIS — R059 Cough, unspecified: Secondary | ICD-10-CM

## 2017-09-22 MED ORDER — AMOXICILLIN-POT CLAVULANATE 875-125 MG PO TABS: 1.0000 | ORAL_TABLET | Freq: Two times a day (BID) | ORAL | 0 refills | Status: DC

## 2017-09-22 MED ORDER — FLUTICASONE PROPIONATE 50 MCG/ACT NA SUSP: 2.0000 | Freq: Every day | NASAL | 2 refills | Status: DC

## 2017-09-22 MED ORDER — FLUCONAZOLE 150 MG PO TABS: 150.0000 mg | ORAL_TABLET | Freq: Once | ORAL | 0 refills | Status: AC

## 2017-09-22 MED ORDER — ALBUTEROL SULFATE HFA 108 (90 BASE) MCG/ACT IN AERS: 2.0000 | INHALATION_SPRAY | Freq: Four times a day (QID) | RESPIRATORY_TRACT | 2 refills | Status: DC | PRN

## 2017-09-22 NOTE — Telephone Encounter (Signed)
Message left for patient that Diflucan had been sent in to pharmacy and to contact office if she had any concerns. CRM created incase she calls back.

## 2017-09-22 NOTE — Telephone Encounter (Signed)
(  Routing message to [place])
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
## 2017-09-22 NOTE — Telephone Encounter (Signed)
Sent in Rx for Diflucan for her;

## 2017-09-22 NOTE — Telephone Encounter (Signed)
Copied from [place] [id number]. Topic: Quick Communication - See Telephone Encounter >> Sep 22, 2017  8:56 AM Conception Chancy, NT wrote: CRM for notification. See Telephone encounter for:  09/22/17.  Patient is calling and just left office seeing Jodi Mourning, she was prescribed antibiotics and forgot to let her know that she gets yeast infections when taking those and will need the single pill you take for that. (unaware of what it is called at this time). Please advise.   [id number] - [place], [place]

## 2017-10-04 ENCOUNTER — Other Ambulatory Visit (INDEPENDENT_AMBULATORY_CARE_PROVIDER_SITE_OTHER): Payer: Medicare Other

## 2017-10-04 ENCOUNTER — Encounter: Payer: Self-pay | Admitting: Family Medicine

## 2017-10-04 ENCOUNTER — Ambulatory Visit (INDEPENDENT_AMBULATORY_CARE_PROVIDER_SITE_OTHER)
Admission: RE | Admit: 2017-10-04 | Discharge: 2017-10-04 | Disposition: A | Discharge status: Home / Self Care | Payer: Medicare Other | Source: Ambulatory Visit | Attending: Family Medicine | Admitting: Family Medicine

## 2017-10-04 ENCOUNTER — Ambulatory Visit (INDEPENDENT_AMBULATORY_CARE_PROVIDER_SITE_OTHER): Payer: Medicare Other | Admitting: Family Medicine

## 2017-10-04 ENCOUNTER — Encounter: Payer: Self-pay | Admitting: Internal Medicine

## 2017-10-04 ENCOUNTER — Ambulatory Visit (INDEPENDENT_AMBULATORY_CARE_PROVIDER_SITE_OTHER): Payer: Medicare Other | Admitting: Internal Medicine

## 2017-10-04 VITALS — BP 124/86 | HR 61 | Temp 98.0°F | Ht 64.0 in | Wt 218.0 lb

## 2017-10-04 VITALS — BP 124/86 | HR 61 | Ht 64.0 in | Wt 218.0 lb

## 2017-10-04 DIAGNOSIS — E119 Type 2 diabetes mellitus without complications: Secondary | ICD-10-CM | POA: Diagnosis not present

## 2017-10-04 DIAGNOSIS — M25552 Pain in left hip: Secondary | ICD-10-CM

## 2017-10-04 DIAGNOSIS — M545 Low back pain: Secondary | ICD-10-CM | POA: Diagnosis not present

## 2017-10-04 DIAGNOSIS — M5416 Radiculopathy, lumbar region: Secondary | ICD-10-CM

## 2017-10-04 DIAGNOSIS — M7062 Trochanteric bursitis, left hip: Secondary | ICD-10-CM

## 2017-10-04 DIAGNOSIS — E785 Hyperlipidemia, unspecified: Secondary | ICD-10-CM

## 2017-10-04 DIAGNOSIS — Z0001 Encounter for general adult medical examination with abnormal findings: Secondary | ICD-10-CM

## 2017-10-04 DIAGNOSIS — I10 Essential (primary) hypertension: Secondary | ICD-10-CM | POA: Diagnosis not present

## 2017-10-04 DIAGNOSIS — Z23 Encounter for immunization: Secondary | ICD-10-CM

## 2017-10-04 LAB — CBC WITH DIFFERENTIAL/PLATELET
BASOS PCT: 0.8 % (ref 0.0–3.0)
Basophils Absolute: 0 10*3/uL (ref 0.0–0.1)
EOS ABS: 0.2 10*3/uL (ref 0.0–0.7)
EOS PCT: 3.1 % (ref 0.0–5.0)
HEMATOCRIT: 36.7 % (ref 36.0–46.0)
Hemoglobin: 11.5 g/dL — ABNORMAL LOW (ref 12.0–15.0)
Lymphocytes Relative: 57.8 % — ABNORMAL HIGH (ref 12.0–46.0)
Lymphs Abs: 3.1 10*3/uL (ref 0.7–4.0)
MCHC: 31.4 g/dL (ref 30.0–36.0)
MCV: 75.2 fl — ABNORMAL LOW (ref 78.0–100.0)
Monocytes Absolute: 0.4 10*3/uL (ref 0.1–1.0)
Monocytes Relative: 7.5 % (ref 3.0–12.0)
NEUTROS ABS: 1.6 10*3/uL (ref 1.4–7.7)
Neutrophils Relative %: 30.8 % — ABNORMAL LOW (ref 43.0–77.0)
PLATELETS: 198 10*3/uL (ref 150.0–400.0)
RBC: 4.88 Mil/uL (ref 3.87–5.11)
RDW: 14.7 % (ref 11.5–15.5)

## 2017-10-04 LAB — MICROALBUMIN / CREATININE URINE RATIO
Creatinine,U: 111.1 mg/dL
MICROALB/CREAT RATIO: 0.6 mg/g (ref 0.0–30.0)

## 2017-10-04 LAB — BASIC METABOLIC PANEL
BUN: 13 mg/dL (ref 6–23)
CHLORIDE: 103 meq/L (ref 96–112)
CO2: 29 mEq/L (ref 19–32)
Calcium: 9.5 mg/dL (ref 8.4–10.5)
Creatinine, Ser: 0.91 mg/dL (ref 0.40–1.20)
GFR: 80.92 mL/min (ref >60.00)
Glucose, Bld: 81 mg/dL (ref 70–99)
POTASSIUM: 4.3 meq/L (ref 3.5–5.1)
SODIUM: 138 meq/L (ref 135–145)

## 2017-10-04 LAB — HEPATIC FUNCTION PANEL
ALBUMIN: 4 g/dL (ref 3.5–5.2)
ALT: 17 U/L (ref 0–35)
AST: 19 U/L (ref 0–37)
Alkaline Phosphatase: 46 U/L (ref 39–117)
BILIRUBIN DIRECT: 0 mg/dL (ref 0.0–0.3)
TOTAL PROTEIN: 7.5 g/dL (ref 6.0–8.3)
Total Bilirubin: 0.3 mg/dL (ref 0.2–1.2)

## 2017-10-04 LAB — URINALYSIS, ROUTINE W REFLEX MICROSCOPIC
BILIRUBIN URINE: NEGATIVE
HGB URINE DIPSTICK: NEGATIVE
Ketones, ur: NEGATIVE
NITRITE: NEGATIVE
RBC / HPF: NONE SEEN (ref 0–?)
Specific Gravity, Urine: 1.025 (ref 1.000–1.030)
TOTAL PROTEIN, URINE-UPE24: NEGATIVE
Urine Glucose: NEGATIVE
Urobilinogen, UA: 0.2 (ref 0.0–1.0)
pH: 5.5 (ref 5.0–8.0)

## 2017-10-04 LAB — LIPID PANEL
CHOLESTEROL: 152 mg/dL (ref 0–200)
HDL: 53.5 mg/dL (ref >39.00)
LDL CALC: 78 mg/dL (ref 0–99)
NonHDL: 98
TRIGLYCERIDES: 98 mg/dL (ref 0.0–149.0)
Total CHOL/HDL Ratio: 3
VLDL: 19.6 mg/dL (ref 0.0–40.0)

## 2017-10-04 LAB — HEMOGLOBIN A1C: HEMOGLOBIN A1C: 5.4 % (ref 4.6–6.5)

## 2017-10-04 LAB — TSH: TSH: 6.65 u[IU]/mL — ABNORMAL HIGH (ref 0.35–4.50)

## 2017-10-04 MED ORDER — GABAPENTIN 100 MG PO CAPS: 200.0000 mg | ORAL_CAPSULE | Freq: Every day | ORAL | 3 refills | Status: DC

## 2017-10-04 MED ORDER — AZITHROMYCIN 250 MG PO TABS: ORAL_TABLET | ORAL | 1 refills | Status: DC
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
## 2017-10-04 NOTE — Patient Instructions (Addendum)
Good to see you  Elizabeth Hawkins is your friend. Ice 20 minutes 2 times daily. Usually after activity and before bed. Exercises 3 times a week.  Stay active if you can  Gabapentin 200mg  at night Xray downstairs as well
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
## 2017-10-04 NOTE — Patient Instructions (Signed)
You had the Prevnar 13 pneumonia shot today  Please remember to call about scheduling your Colonoscopy  Please take all new medication as prescribed - the antibiotic  You will be able to see Dr Tamala Julian later this afternoon for the left hip pain  Please continue all other medications as before, and refills have been done if requested.  Please have the pharmacy call with any other refills you may need.  Please continue your efforts at being more active, low cholesterol diet, and weight control.  You are otherwise up to date with prevention measures today.  Please keep your appointments with your specialists as you may have planned  Please go to the LAB in the Basement (turn left off the elevator) for the tests to be done today  You will be contacted by phone if any changes need to be made immediately.  Otherwise, you will receive a letter about your results with an explanation, but please check with MyChart first.  Please remember to sign up for MyChart if you have not done so, as this will be important to you in the future with finding out test results, communicating by private email, and scheduling acute appointments online when needed.  Please return in 6 months, or sooner if needed, with Lab testing done 3-5 days before

## 2017-10-04 NOTE — Assessment & Plan Note (Signed)
Patient was given an injection.  Tolerated the procedure well.  Discussed icing regimen and home exercises.  Discussed which activities of doing which wants to avoid.  Patient is in significant amount of pain and history of cancer we will get x-rays to further evaluate.  May need to consider possible advanced imaging if continued to have difficulty.  Given gabapentin for nighttime relief.

## 2017-10-07 DIAGNOSIS — M25552 Pain in left hip: Secondary | ICD-10-CM | POA: Insufficient documentation

## 2017-10-07 NOTE — Assessment & Plan Note (Signed)
stable overall by history and exam, recent data reviewed with pt, and pt to continue medical treatment as before,  to f/u any worsening symptoms or concerns BP Readings from Last 3 Encounters:  10/04/17 124/86  10/04/17 124/86  09/22/17 128/82

## 2017-10-07 NOTE — Assessment & Plan Note (Signed)

## 2017-10-07 NOTE — Assessment & Plan Note (Signed)
Most likely c/w bursitis left hip, have d/w sports medicine - will see today  In addition to the time spent performing CPE, I spent an additional 15 minutes face to face,in which greater than 50% of this time was spent in counseling and coordination of care for patient's illness as documented, including the differential dx, treatment, further evaluation and other management of left hip pain, DM, HTN, HLD

## 2017-10-07 NOTE — Assessment & Plan Note (Signed)
stable overall by history and exam, recent data reviewed with pt, and pt to continue medical treatment as before,  to f/u any worsening symptoms or concerns Lab Results  Component Value Date   LDLCALC 78 10/04/2017

## 2017-10-07 NOTE — Assessment & Plan Note (Signed)
stable overall by history and exam, recent data reviewed with pt, and pt to continue medical treatment as before,  to f/u any worsening symptoms or concerns Lab Results  Component Value Date   HGBA1C 5.4 10/04/2017

## 2017-10-09 ENCOUNTER — Telehealth: Payer: Self-pay | Admitting: *Deleted

## 2017-10-09 ENCOUNTER — Other Ambulatory Visit: Payer: Self-pay | Admitting: Internal Medicine

## 2017-10-09 ENCOUNTER — Encounter: Payer: Self-pay | Admitting: Internal Medicine

## 2017-10-09 DIAGNOSIS — M5416 Radiculopathy, lumbar region: Secondary | ICD-10-CM

## 2017-10-09 DIAGNOSIS — M25552 Pain in left hip: Secondary | ICD-10-CM

## 2017-10-09 MED ORDER — PREDNISONE 10 MG PO TABS
ORAL_TABLET | ORAL | 0 refills | Status: DC
Start: 2017-10-09 — End: 2017-10-25

## 2017-10-09 MED ORDER — TRAMADOL HCL 50 MG PO TABS: 50.0000 mg | ORAL_TABLET | Freq: Three times a day (TID) | ORAL | 1 refills | Status: DC | PRN

## 2017-10-09 NOTE — Telephone Encounter (Signed)
Copied from [place] [id number]. Topic: Inquiry >> Oct 09, 2017  9:53 AM Oliver Pila B wrote: Reason for CRM: pt called and states after her visit w/ Dr. Tamala Julian she is still in pain and she cant lift her left leg, the cortisone shot and medication is not working, contact pt to advise

## 2017-10-09 NOTE — Telephone Encounter (Signed)
Ok to try OTC Nasacort and Mucinex if not fever or worsening pain or swelling

## 2017-10-09 NOTE — Telephone Encounter (Signed)
Discussed with pt, ordered MRI lumbar w/o & L hip w/o.   Dr. Jenny Reichmann: Pt also stated that she is still not feeling any better after taking z-pak. She states she continues to have "raspy voice", head congestion, & dry cough. She would like to know what else can she do?

## 2017-10-09 NOTE — Telephone Encounter (Signed)
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

## 2017-10-09 NOTE — Telephone Encounter (Signed)
Ok to try prednisone course, and tramadol prn - done erx

## 2017-10-10 NOTE — Telephone Encounter (Signed)
Pt made aware via my chart

## 2017-10-13 ENCOUNTER — Ambulatory Visit
Admission: RE | Admit: 2017-10-13 | Discharge: 2017-10-13 | Disposition: A | Discharge status: Home / Self Care | Payer: Medicare Other | Source: Ambulatory Visit | Attending: Family Medicine | Admitting: Family Medicine

## 2017-10-13 DIAGNOSIS — M48061 Spinal stenosis, lumbar region without neurogenic claudication: Secondary | ICD-10-CM | POA: Diagnosis not present

## 2017-10-13 DIAGNOSIS — M25552 Pain in left hip: Secondary | ICD-10-CM | POA: Diagnosis not present

## 2017-10-13 DIAGNOSIS — M5416 Radiculopathy, lumbar region: Secondary | ICD-10-CM
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
## 2017-10-14 ENCOUNTER — Encounter: Payer: Self-pay | Admitting: Family Medicine

## 2017-10-16 ENCOUNTER — Telehealth: Payer: Self-pay | Admitting: *Deleted

## 2017-10-16 ENCOUNTER — Ambulatory Visit: Payer: Self-pay

## 2017-10-16 ENCOUNTER — Ambulatory Visit (INDEPENDENT_AMBULATORY_CARE_PROVIDER_SITE_OTHER): Payer: Medicare Other | Admitting: *Deleted

## 2017-10-16 VITALS — BP 148/82 | HR 75 | Resp 18 | Ht 64.0 in | Wt 212.0 lb

## 2017-10-16 DIAGNOSIS — Z0000 Encounter for general adult medical examination without abnormal findings: Secondary | ICD-10-CM | POA: Diagnosis not present

## 2017-10-16 DIAGNOSIS — Z1211 Encounter for screening for malignant neoplasm of colon: Secondary | ICD-10-CM

## 2017-10-16 NOTE — Patient Instructions (Addendum)
Continue doing brain stimulating activities (puzzles, reading, adult coloring books, staying active) to keep memory sharp.   Continue to eat heart healthy diet (full of fruits, vegetables, whole grains, lean protein, water--limit salt, fat, and sugar intake) and increase physical activity as tolerated.   Elizabeth Hawkins , Thank you for taking time to come for your Medicare Wellness Visit. I appreciate your ongoing commitment to your health goals. Please review the following plan we discussed and let me know if I can assist you in the future.   These are the goals we discussed: Goals    . Patient Stated     I will increase my physical activity by doing upper arms, chair exercises, and physical therapy hip exercises       This is a list of the screening recommended for you and due dates:  Health Maintenance  Topic Date Due  . Pneumococcal vaccine (1) 02/14/1959  . HIV Screening  02/14/1972  . Colon Cancer Screening  05/24/2017  . Mammogram  08/07/2018*  . Hemoglobin A1C  04/03/2018  . Eye exam for diabetics  08/08/2018  . Complete foot exam   10/04/2018  . Urine Protein Check  10/04/2018  . Tetanus Vaccine  08/09/2026  . Flu Shot  Completed  .  Hepatitis C: One time screening is recommended by Center for Disease Control  (CDC) for  adults born from [birth year] through 1964.   Completed  *Topic was postponed. The date shown is not the original due date.     Insomnia Insomnia is a sleep disorder that makes it difficult to fall asleep or to stay asleep. Insomnia can cause tiredness (fatigue), low energy, difficulty concentrating, mood swings, and poor performance at work or school. There are three different ways to classify insomnia:  Difficulty falling asleep.  Difficulty staying asleep.  Waking up too early in the morning.  Any type of insomnia can be long-term (chronic) or short-term (acute). Both are common. Short-term insomnia usually lasts for three months or less. Chronic insomnia  occurs at least three times a week for longer than three months. What are the causes? Insomnia may be caused by another condition, situation, or substance, such as:  Anxiety.  Certain medicines.  Gastroesophageal reflux disease (GERD) or other gastrointestinal conditions.  Asthma or other breathing conditions.  Restless legs syndrome, sleep apnea, or other sleep disorders.  Chronic pain.  Menopause. This may include hot flashes.  Stroke.  Abuse of alcohol, tobacco, or illegal drugs.  Depression.  Caffeine.  Neurological disorders, such as Alzheimer disease.  An overactive thyroid (hyperthyroidism).  The cause of insomnia may not be known. What increases the risk? Risk factors for insomnia include:  Gender. Women are more commonly affected than men.  Age. Insomnia is more common as you get older.  Stress. This may involve your professional or personal life.  Income. Insomnia is more common in people with lower income.  Lack of exercise.  Irregular work schedule or night shifts.  Traveling between different time zones.  What are the signs or symptoms? If you have insomnia, trouble falling asleep or trouble staying asleep is the main symptom. This may lead to other symptoms, such as:  Feeling fatigued.  Feeling nervous about going to sleep.  Not feeling rested in the morning.  Having trouble concentrating.  Feeling irritable, anxious, or depressed.  How is this treated? Treatment for insomnia depends on the cause. If your insomnia is caused by an underlying condition, treatment will focus on addressing the  condition. Treatment may also include:  Medicines to help you sleep.  Counseling or therapy.  Lifestyle adjustments.  Follow these instructions at home:  Take medicines only as directed by your health care provider.  Keep regular sleeping and waking hours. Avoid naps.  Keep a sleep diary to help you and your health care provider figure out  what could be causing your insomnia. Include: ? When you sleep. ? When you wake up during the night. ? How well you sleep. ? How rested you feel the next day. ? Any side effects of medicines you are taking. ? What you eat and drink.  Make your bedroom a comfortable place where it is easy to fall asleep: ? Put up shades or special blackout curtains to block light from outside. ? Use a [place] noise machine to block noise. ? Keep the temperature cool.  Exercise regularly as directed by your health care provider. Avoid exercising right before bedtime.  Use relaxation techniques to manage stress. Ask your health care provider to suggest some techniques that may work well for you. These may include: ? Breathing exercises. ? Routines to release muscle tension. ? Visualizing peaceful scenes.  Cut back on alcohol, caffeinated beverages, and cigarettes, especially close to bedtime. These can disrupt your sleep.  Do not overeat or eat spicy foods right before bedtime. This can lead to digestive discomfort that can make it hard for you to sleep.  Limit screen use before bedtime. This includes: ? Watching TV. ? Using your smartphone, tablet, and computer.  Stick to a routine. This can help you fall asleep faster. Try to do a quiet activity, brush your teeth, and go to bed at the same time each night.  Get out of bed if you are still awake after 15 minutes of trying to sleep. Keep the lights down, but try reading or doing a quiet activity. When you feel sleepy, go back to bed.  Make sure that you drive carefully. Avoid driving if you feel very sleepy.  Keep all follow-up appointments as directed by your health care provider. This is important. Contact a health care provider if:  You are tired throughout the day or have trouble in your daily routine due to sleepiness.  You continue to have sleep problems or your sleep problems get worse. Get help right away if:  You have serious thoughts  about hurting yourself or someone else. This information is not intended to replace advice given to you by your health care provider. Make sure you discuss any questions you have with your health care provider. Document Released: 07/22/2000 Document Revised: 12/25/2015 Document Reviewed: 04/25/2014 Elsevier Interactive Patient Education  [employer].

## 2017-10-17 NOTE — Telephone Encounter (Signed)
error 

## 2017-10-25 ENCOUNTER — Ambulatory Visit: Payer: Self-pay

## 2017-10-25 ENCOUNTER — Encounter: Payer: Self-pay | Admitting: Family Medicine

## 2017-10-25 ENCOUNTER — Ambulatory Visit (INDEPENDENT_AMBULATORY_CARE_PROVIDER_SITE_OTHER): Payer: Medicare Other | Admitting: Family Medicine

## 2017-10-25 VITALS — BP 150/88 | HR 78 | Ht 64.0 in | Wt 212.0 lb

## 2017-10-25 DIAGNOSIS — M7062 Trochanteric bursitis, left hip: Secondary | ICD-10-CM | POA: Diagnosis not present

## 2017-10-25 DIAGNOSIS — M25552 Pain in left hip: Secondary | ICD-10-CM

## 2017-10-25 DIAGNOSIS — M48061 Spinal stenosis, lumbar region without neurogenic claudication: Secondary | ICD-10-CM | POA: Diagnosis not present

## 2017-10-25 DIAGNOSIS — M25859 Other specified joint disorders, unspecified hip: Secondary | ICD-10-CM
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
## 2017-10-25 NOTE — Patient Instructions (Signed)
Good to see you  Injected the GT bursitis today  I hope it helps OK to go to the gym but start with biking, elliptical, swimming or machine weights and only up to 4 times a week.  Continue the vitamins DHEA 50mg  daily for 3 weeks then 1 week off.  See me again in 3 weeks and consider a hip joint injection if not better

## 2017-10-25 NOTE — Assessment & Plan Note (Signed)
Patient does have spinal stenosis noted.  Mild L4-L5.  Worsening symptoms we will consider injections.  Follow-up again 4 weeks

## 2017-10-25 NOTE — Assessment & Plan Note (Signed)
Patient does have calcific change of the superior labrum.  Could potentially consider injection if continues to have trouble.  We discussed icing regimen and home exercise.

## 2017-10-25 NOTE — Assessment & Plan Note (Signed)
Attempted injection again October 25, 2017.  If no significant improvement we will consider an intra-articular injection.  Patient would also consider a potential epidural at L4-L5 if we do not make significant change.  Patient is in agreement with the plan.  Follow-up again in 4 weeks

## 2017-10-26 ENCOUNTER — Encounter (INDEPENDENT_AMBULATORY_CARE_PROVIDER_SITE_OTHER): Payer: Medicare Other

## 2017-10-27 ENCOUNTER — Encounter: Payer: Self-pay | Admitting: Internal Medicine

## 2017-11-02 ENCOUNTER — Encounter (INDEPENDENT_AMBULATORY_CARE_PROVIDER_SITE_OTHER): Payer: Self-pay | Admitting: Family Medicine

## 2017-11-02 ENCOUNTER — Ambulatory Visit (INDEPENDENT_AMBULATORY_CARE_PROVIDER_SITE_OTHER): Payer: Medicare Other | Admitting: Family Medicine

## 2017-11-02 VITALS — BP 141/88 | HR 63 | Temp 97.7°F | Ht 64.0 in | Wt 205.0 lb

## 2017-11-02 DIAGNOSIS — E119 Type 2 diabetes mellitus without complications: Secondary | ICD-10-CM | POA: Diagnosis not present

## 2017-11-02 DIAGNOSIS — Z1331 Encounter for screening for depression: Secondary | ICD-10-CM

## 2017-11-02 DIAGNOSIS — Z6836 Body mass index (BMI) 36.0-36.9, adult: Secondary | ICD-10-CM

## 2017-11-02 DIAGNOSIS — E559 Vitamin D deficiency, unspecified: Secondary | ICD-10-CM

## 2017-11-02 DIAGNOSIS — R0602 Shortness of breath: Secondary | ICD-10-CM | POA: Diagnosis not present

## 2017-11-02 DIAGNOSIS — E038 Other specified hypothyroidism: Secondary | ICD-10-CM | POA: Diagnosis not present

## 2017-11-02 DIAGNOSIS — R5383 Other fatigue: Secondary | ICD-10-CM | POA: Diagnosis not present

## 2017-11-02 DIAGNOSIS — Z0289 Encounter for other administrative examinations: Secondary | ICD-10-CM
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
## 2017-11-03 LAB — T3: T3, Total: 79 ng/dL (ref 71–180)

## 2017-11-03 LAB — T4, FREE: FREE T4: 1.22 ng/dL (ref 0.82–1.77)

## 2017-11-03 LAB — INSULIN, RANDOM: INSULIN: 23.2 u[IU]/mL (ref 2.6–24.9)

## 2017-11-03 LAB — TSH: TSH: 3.38 u[IU]/mL (ref 0.450–4.500)

## 2017-11-16 ENCOUNTER — Ambulatory Visit (INDEPENDENT_AMBULATORY_CARE_PROVIDER_SITE_OTHER): Payer: Medicare Other | Admitting: Family Medicine

## 2017-11-16 VITALS — BP 130/84 | HR 60 | Temp 98.5°F | Ht 64.0 in | Wt 210.0 lb

## 2017-11-16 DIAGNOSIS — E119 Type 2 diabetes mellitus without complications: Secondary | ICD-10-CM | POA: Diagnosis not present

## 2017-11-16 DIAGNOSIS — E038 Other specified hypothyroidism: Secondary | ICD-10-CM | POA: Diagnosis not present

## 2017-11-16 DIAGNOSIS — Z6836 Body mass index (BMI) 36.0-36.9, adult: Secondary | ICD-10-CM
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
## 2017-12-04 ENCOUNTER — Ambulatory Visit (INDEPENDENT_AMBULATORY_CARE_PROVIDER_SITE_OTHER): Payer: Medicare Other | Admitting: Family Medicine

## 2017-12-14 ENCOUNTER — Encounter (INDEPENDENT_AMBULATORY_CARE_PROVIDER_SITE_OTHER): Payer: Self-pay

## 2017-12-14 ENCOUNTER — Ambulatory Visit (INDEPENDENT_AMBULATORY_CARE_PROVIDER_SITE_OTHER): Payer: Medicare Other | Admitting: Family Medicine

## 2017-12-19 ENCOUNTER — Encounter: Payer: Self-pay | Admitting: Family Medicine

## 2017-12-20 MED ORDER — PREDNISONE 50 MG PO TABS: 50.0000 mg | ORAL_TABLET | Freq: Every day | ORAL | 0 refills | Status: DC

## 2017-12-22 ENCOUNTER — Other Ambulatory Visit (INDEPENDENT_AMBULATORY_CARE_PROVIDER_SITE_OTHER): Payer: Medicare Other

## 2017-12-22 ENCOUNTER — Encounter: Payer: Self-pay | Admitting: Internal Medicine

## 2017-12-22 ENCOUNTER — Ambulatory Visit (INDEPENDENT_AMBULATORY_CARE_PROVIDER_SITE_OTHER): Payer: Medicare Other | Admitting: Internal Medicine

## 2017-12-22 VITALS — BP 138/86 | HR 65 | Temp 98.2°F | Ht 64.0 in | Wt 214.0 lb

## 2017-12-22 DIAGNOSIS — R109 Unspecified abdominal pain: Secondary | ICD-10-CM | POA: Insufficient documentation

## 2017-12-22 DIAGNOSIS — R1032 Left lower quadrant pain: Secondary | ICD-10-CM | POA: Diagnosis not present

## 2017-12-22 DIAGNOSIS — M545 Low back pain, unspecified: Secondary | ICD-10-CM

## 2017-12-22 DIAGNOSIS — I10 Essential (primary) hypertension: Secondary | ICD-10-CM

## 2017-12-22 LAB — URINALYSIS, ROUTINE W REFLEX MICROSCOPIC
Bilirubin Urine: NEGATIVE
HGB URINE DIPSTICK: NEGATIVE
KETONES UR: NEGATIVE
LEUKOCYTES UA: NEGATIVE
Nitrite: NEGATIVE
RBC / HPF: NONE SEEN (ref 0–?)
Specific Gravity, Urine: 1.025 (ref 1.000–1.030)
Total Protein, Urine: NEGATIVE
UROBILINOGEN UA: 0.2 (ref 0.0–1.0)
Urine Glucose: NEGATIVE
pH: 5 (ref 5.0–8.0)

## 2017-12-22 LAB — CBC WITH DIFFERENTIAL/PLATELET
BASOS ABS: 0 10*3/uL (ref 0.0–0.1)
Basophils Relative: 0.3 % (ref 0.0–3.0)
EOS ABS: 0 10*3/uL (ref 0.0–0.7)
Eosinophils Relative: 0.2 % (ref 0.0–5.0)
HEMATOCRIT: 37.6 % (ref 36.0–46.0)
HEMOGLOBIN: 11.7 g/dL — AB (ref 12.0–15.0)
Lymphocytes Relative: 31.5 % (ref 12.0–46.0)
Lymphs Abs: 2.6 10*3/uL (ref 0.7–4.0)
MCHC: 31 g/dL (ref 30.0–36.0)
MCV: 76.1 fl — ABNORMAL LOW (ref 78.0–100.0)
MONO ABS: 0.5 10*3/uL (ref 0.1–1.0)
Monocytes Relative: 6.1 % (ref 3.0–12.0)
Neutro Abs: 5.1 10*3/uL (ref 1.4–7.7)
Neutrophils Relative %: 61.9 % (ref 43.0–77.0)
Platelets: 208 10*3/uL (ref 150.0–400.0)
RBC: 4.94 Mil/uL (ref 3.87–5.11)
RDW: 15.5 % (ref 11.5–15.5)
WBC: 8.3 10*3/uL (ref 4.0–10.5)

## 2017-12-22 LAB — BASIC METABOLIC PANEL
BUN: 18 mg/dL (ref 6–23)
CHLORIDE: 103 meq/L (ref 96–112)
CO2: 30 meq/L (ref 19–32)
Calcium: 9.4 mg/dL (ref 8.4–10.5)
Creatinine, Ser: 0.91 mg/dL (ref 0.40–1.20)
GFR: 80.86 mL/min (ref >60.00)
GLUCOSE: 94 mg/dL (ref 70–99)
POTASSIUM: 4.5 meq/L (ref 3.5–5.1)
SODIUM: 139 meq/L (ref 135–145)

## 2017-12-22 LAB — HEPATIC FUNCTION PANEL
ALBUMIN: 4 g/dL (ref 3.5–5.2)
ALK PHOS: 41 U/L (ref 39–117)
ALT: 18 U/L (ref 0–35)
AST: 17 U/L (ref 0–37)
BILIRUBIN DIRECT: 0.1 mg/dL (ref 0.0–0.3)
TOTAL PROTEIN: 7.7 g/dL (ref 6.0–8.3)
Total Bilirubin: 0.3 mg/dL (ref 0.2–1.2)

## 2017-12-22 LAB — LIPASE: LIPASE: 20 U/L (ref 11.0–59.0)

## 2017-12-22 MED ORDER — CELECOXIB 200 MG PO CAPS: 200.0000 mg | ORAL_CAPSULE | Freq: Two times a day (BID) | ORAL | 5 refills | Status: DC | PRN

## 2017-12-22 MED ORDER — TRAMADOL HCL 50 MG PO TABS: 50.0000 mg | ORAL_TABLET | Freq: Four times a day (QID) | ORAL | 0 refills | Status: DC | PRN

## 2017-12-22 NOTE — Patient Instructions (Addendum)
Ok to continue the prednisone  Please take all new medication as prescribed  - the celebrex and tramadol as needed for pain  You will be contacted regarding the referral for: orthopedic  Please continue all other medications as before, and refills have been done if requested.  Please have the pharmacy call with any other refills you may need.  Please continue your efforts at being more active, low cholesterol diet, and weight control  Please keep your appointments with your specialists as you may have planned  Please go to the LAB in the Basement (turn left off the elevator) for the tests to be done today  You will be contacted by phone if any changes need to be made immediately.  Otherwise, you will receive a letter about your results with an explanation, but please check with MyChart first.  Please remember to sign up for MyChart if you have not done so, as this will be important to you in the future with finding out test results, communicating by private email, and scheduling acute appointments online when needed.  Please return in 6 months, or sooner if needed
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
## 2017-12-23 NOTE — Assessment & Plan Note (Signed)
mod, to cont prednisone she has just started, for tramadol pain, celebrex bid prn, and refer [facility],  to f/u any worsening symptoms or concerns

## 2017-12-23 NOTE — Assessment & Plan Note (Signed)
stable overall by history and exam, recent data reviewed with pt, and pt to continue medical treatment as before,  to f/u any worsening symptoms or concerns BP Readings from Last 3 Encounters:  12/22/17 138/86  11/16/17 130/84  11/02/17 (!) 141/88

## 2017-12-23 NOTE — Assessment & Plan Note (Addendum)
Etiology unclear, exam benign but I ? Neuritic pain related to lower back,, for cbc, lipase with labs as ordered

## 2017-12-27 ENCOUNTER — Ambulatory Visit (AMBULATORY_SURGERY_CENTER): Payer: Self-pay | Admitting: *Deleted

## 2017-12-27 ENCOUNTER — Other Ambulatory Visit: Payer: Self-pay

## 2017-12-27 VITALS — Ht 64.0 in | Wt 214.0 lb

## 2017-12-27 DIAGNOSIS — Z860100 Personal history of colon polyps, unspecified: Secondary | ICD-10-CM

## 2017-12-27 NOTE — Progress Notes (Signed)
No egg or soy allergy known to patient  No issues with past sedation with any surgeries  or procedures, no intubation problems  No diet pills per patient No home 02 use per patient  No blood thinners per patient  Pt denies issues with constipation as long as takes Miralax daily and Magnesium- daily soft regular stools  No A fib or A flutter  EMMI video sent to pt's e mail

## 2017-12-28 ENCOUNTER — Encounter: Payer: Self-pay | Admitting: Internal Medicine

## 2017-12-28 NOTE — Telephone Encounter (Signed)
Elizabeth Hawkins or Elizabeth Hawkins to consider pt concern please, thanks

## 2018-01-03 ENCOUNTER — Ambulatory Visit: Payer: Self-pay | Admitting: Registered"

## 2018-01-07 IMAGING — CT CT ABD-PELV W/ CM
1 of 3 series · 13 of 32 positions shown, 18 images · IV contrast (iopamidol)
Comparison: CT scan 04/05/2010

CLINICAL DATA: Abdominal pain and bloating for several days.

Creatinine was obtained on site at [HOSPITAL] at [HOSPITAL].
EXAM:
CT ABDOMEN AND PELVIS WITH CONTRAST
TECHNIQUE: Multidetector CT imaging of the abdomen and pelvis was performed
using the standard protocol following bolus administration of
intravenous contrast.
CONTRAST:  125mL NNX58P-AMM IOPAMIDOL (NNX58P-AMM) INJECTION 61%
Results: Creatinine  mg/dL.

[Series 2: abd/pelvis w/cm · axial · 0.84mm/px · z∈[-405,-35]mm · 13 of 84 slices shown, 18 images]
[im 5/84  soft-tissue]
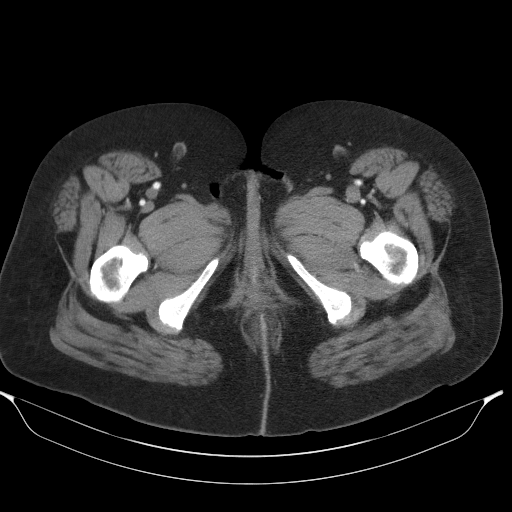
[im 5/84  bone]
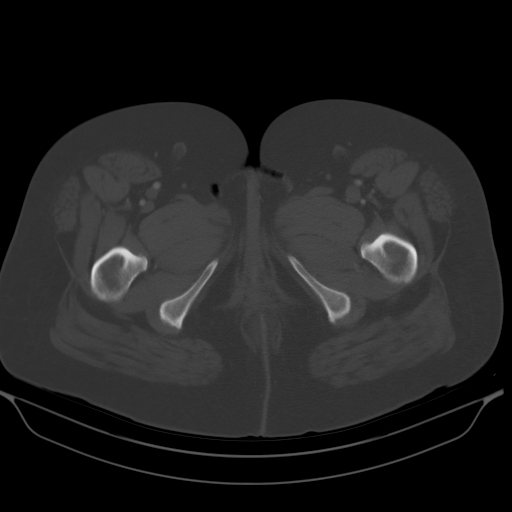
[im 13/84  soft-tissue]
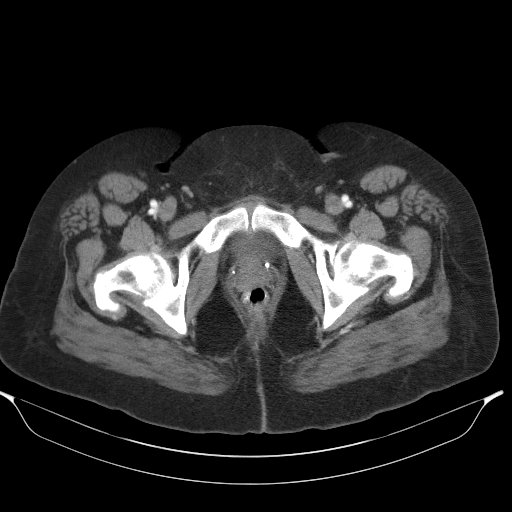
[im 17/84  soft-tissue]
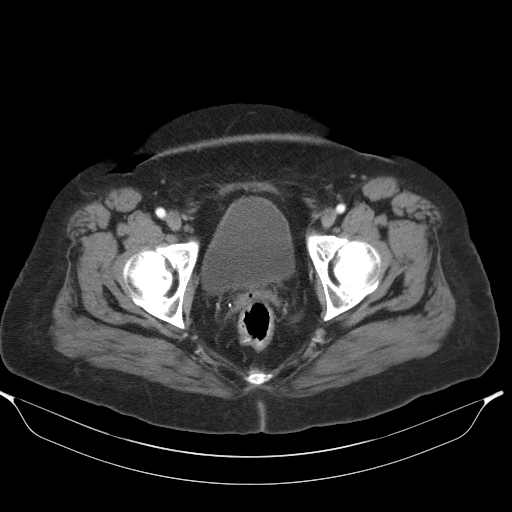
[im 25/84  soft-tissue]
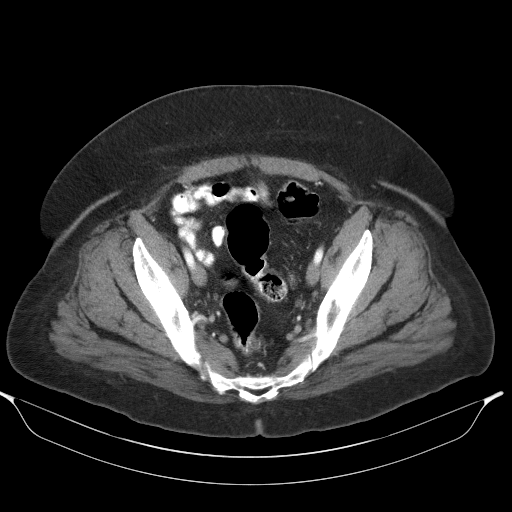
[im 34/84  soft-tissue]
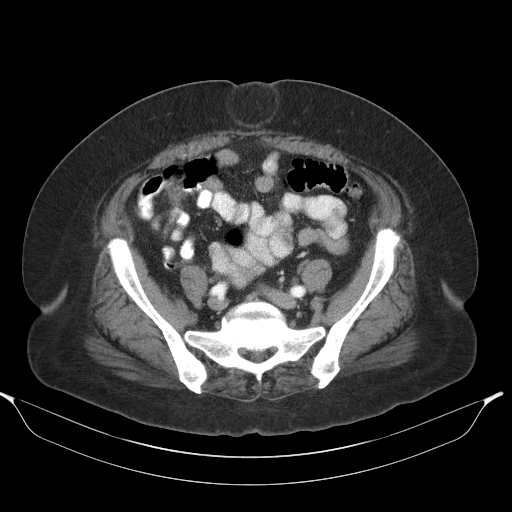
[im 38/84  soft-tissue]
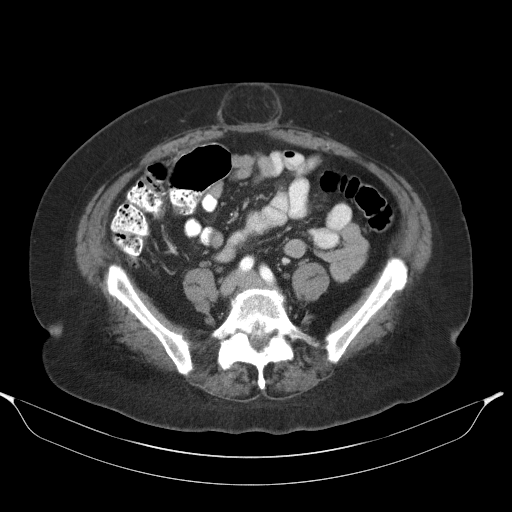
[im 46/84  soft-tissue]
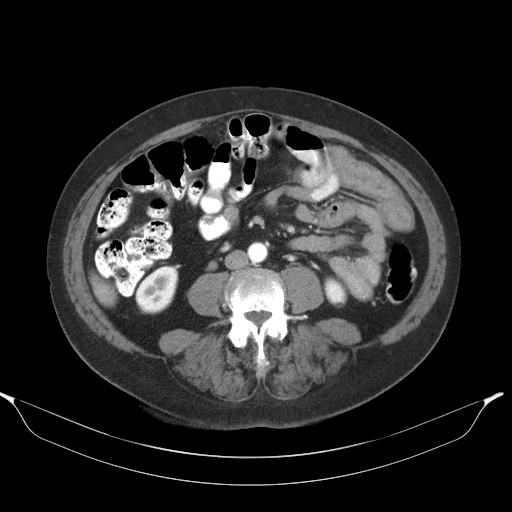
[im 50/84  soft-tissue]
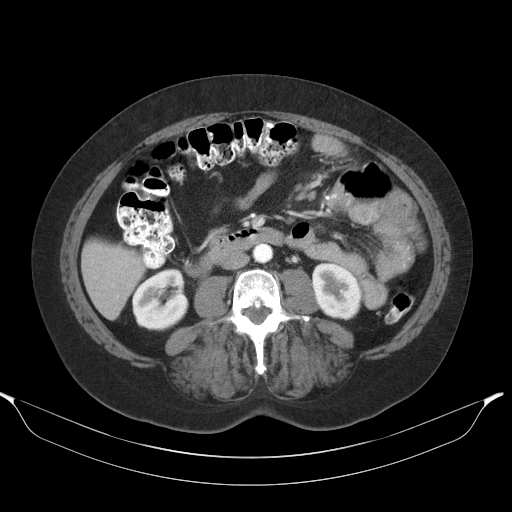
[im 59/84  soft-tissue]
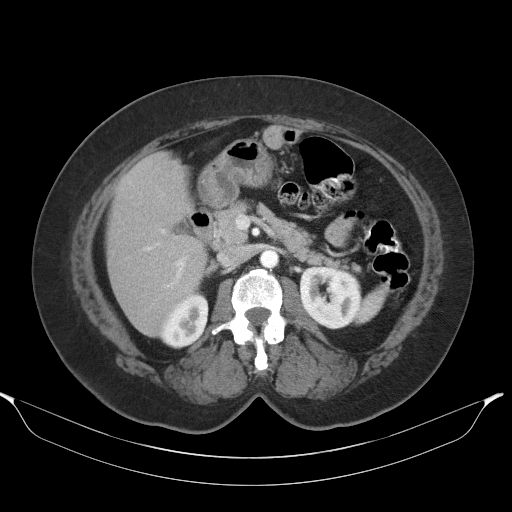
[im 59/84  bone]
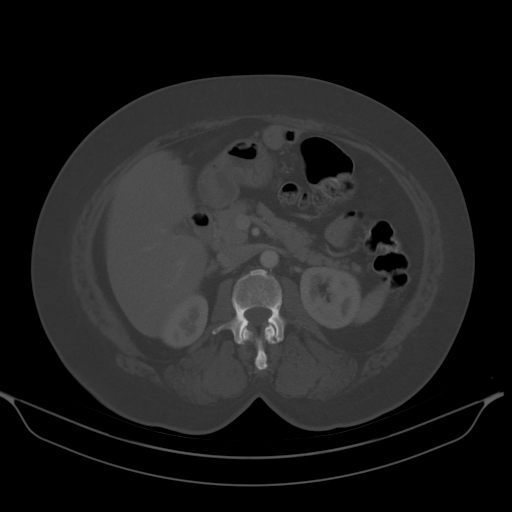
[im 67/84  soft-tissue]
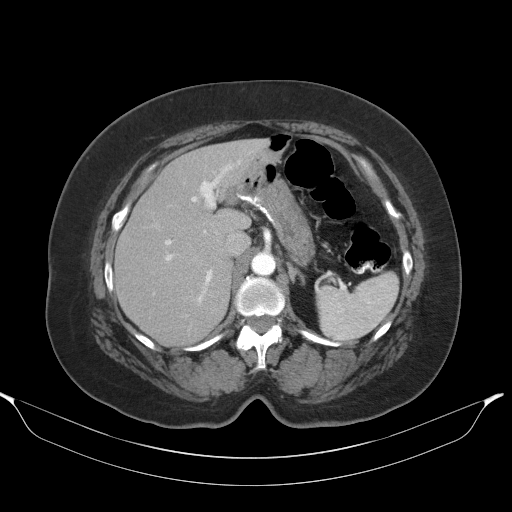
[im 67/84  lung]
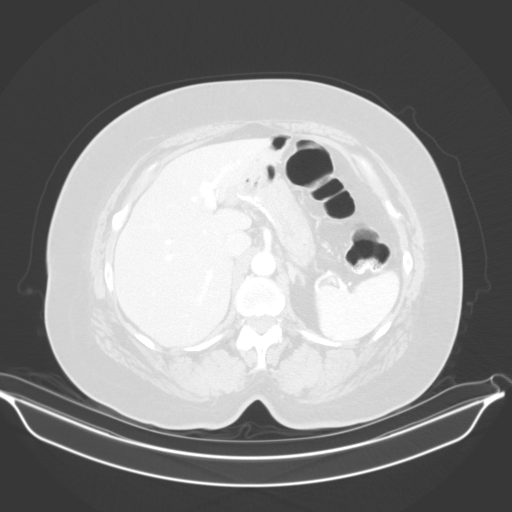
[im 71/84  soft-tissue]
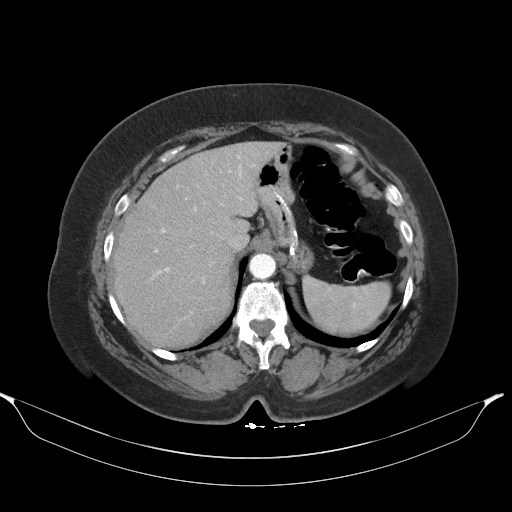
[im 71/84  lung]
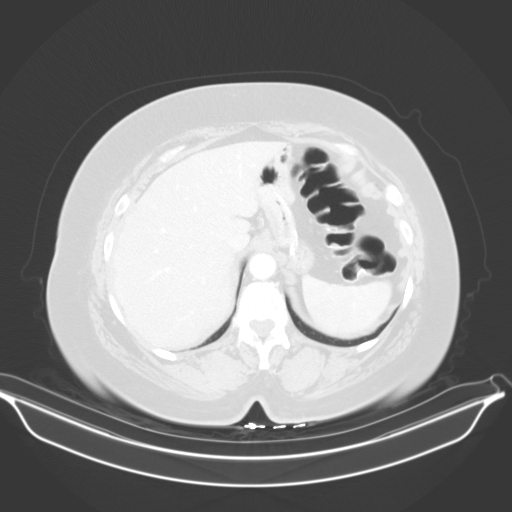
[im 75/84  lung]
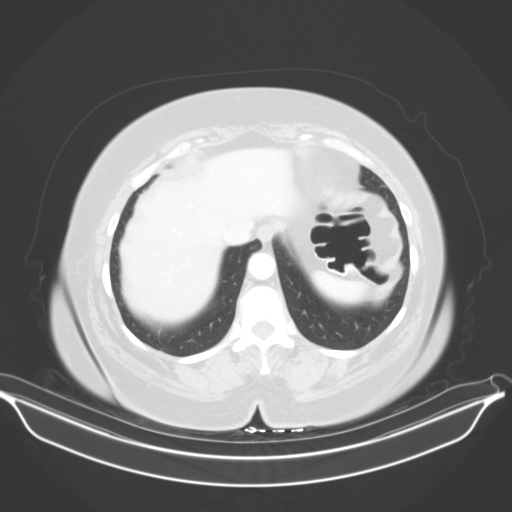
[im 79/84  soft-tissue]
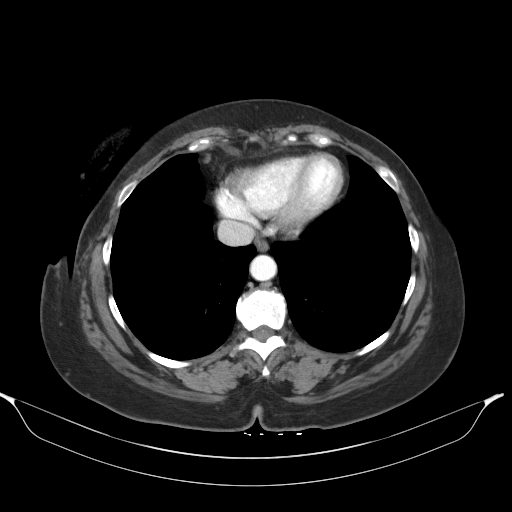
[im 79/84  lung]
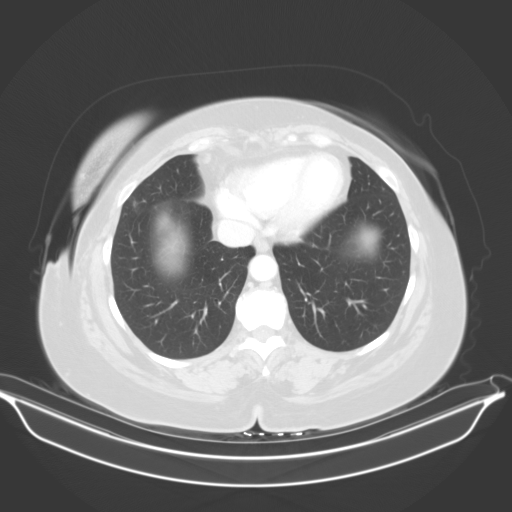

[13 of 32 positions shown; findings below may reference images not displayed]

FINDINGS: Lower chest: The lung bases are clear. No pulmonary lesions or
pleural effusion. The heart is normal in size. No pericardial
effusion. The distal esophagus is grossly normal.

Hepatobiliary: No focal hepatic lesions or intrahepatic biliary
dilatation. The gallbladder is normal. No common bile duct
dilatation.

Pancreas: No mass, inflammation or ductal dilatation.

Spleen: Normal size.  No focal lesions.

Adrenals/Urinary Tract: The adrenal glands and kidneys are
unremarkable. No ureteral or bladder calculi or mass.

Stomach/Bowel: Surgical changes related to gastric bypass surgery.
No complicating features are identified. The duodenum, small bowel
and colon are unremarkable. No inflammatory changes, mass lesions or
obstructive findings. The terminal ileum is normal. The appendix is
normal.

Vascular/Lymphatic: The aorta is normal in caliber. No dissection.
The branch vessels are patent. The major venous structures are
patent. No mesenteric or retroperitoneal mass or adenopathy. Small
scattered lymph nodes are noted.

Reproductive: Surgically absent

Other: No pelvic mass or adenopathy. No free pelvic fluid
collections. No inguinal mass or adenopathy.

There is a moderate-sized anterior abdominal wall hernia just above
the umbilicus. This contains omental fat and a calcification but no
bowel. No subcutaneous lesions.

Musculoskeletal: No significant bony findings.
IMPRESSION: 1. Anterior abdominal wall hernia containing fat.
2. Surgical changes from gastric bypass surgery. No complicating
features are identified.
3. No acute abdominal/pelvic findings, mass lesions or
lymphadenopathy.

## 2018-01-10 ENCOUNTER — Encounter: Payer: Self-pay | Admitting: Internal Medicine

## 2018-01-11 ENCOUNTER — Ambulatory Visit: Payer: Self-pay | Admitting: Registered"

## 2018-01-12 DIAGNOSIS — M5416 Radiculopathy, lumbar region: Secondary | ICD-10-CM | POA: Diagnosis not present

## 2018-01-17 ENCOUNTER — Encounter: Payer: Self-pay | Admitting: Registered"

## 2018-01-17 ENCOUNTER — Encounter: Payer: Medicare Other | Attending: General Surgery | Admitting: Registered"

## 2018-01-17 DIAGNOSIS — E669 Obesity, unspecified: Secondary | ICD-10-CM | POA: Insufficient documentation

## 2018-01-17 DIAGNOSIS — Z713 Dietary counseling and surveillance: Secondary | ICD-10-CM | POA: Diagnosis not present

## 2018-01-17 DIAGNOSIS — E119 Type 2 diabetes mellitus without complications: Secondary | ICD-10-CM

## 2018-01-17 DIAGNOSIS — Z6830 Body mass index (BMI) 30.0-30.9, adult: Secondary | ICD-10-CM | POA: Insufficient documentation

## 2018-01-17 NOTE — Progress Notes (Signed)
Follow-up visit: 3 Years Post-Operative RYGB Surgery  Medical Nutrition Therapy:  Appt start time: 2:10 end time: 3:30  Primary concerns today: Post-operative Bariatric Surgery Nutrition Management. Returns with a 2 lb weight loss. Has stopped eating popcorn and pimento cheese. Plans to try [employer] chips for a crunch. Has noticed that her appetite has increased and this makes her nervous. Has been working on toning more and has been taking a "strength and toning" class and has also learned some exercises to do at home. Would really like to lose another 10 pounds and reach 180 lbs.   Non-scale victories: being more active, feeling better about herself mentally; getting healthier with cholesterol, blood pressure, and diabetes   Surgery date: 12/08/14 Surgery type: RYGB Start weight at [facility]: 262.5 lbs on 04/07/14 Weight today: 218.0 lbs Weight change: 26.5 lbs gain from 191.5 (11/11/2015) Total weight loss: 44.5 lbs Weight loss goals: improve health   TANITA  BODY COMP RESULTS  11/24/14 12/30/14 02/11/15 03/16/15 04/28/15 06/09/15 07/22/15 09/16/15 11/11/15 01/17/2018   BMI (kg/m^2) 43.9 41.2 38.5 36.9 34.9 33.1 32.7 33.2 32.9 37.4   Fat Mass (lbs) 139 124.0 109.5 103.0 91.0 84.0 85 87.5 90 107.6   Fat Free Mass (lbs) 116.5 116.0 115.0 112.0 112.5 109.0 105.5 106 101.5 110.4   Total Body Water (lbs) 85.5 85.0 84.0 82.0 82.5 80.0 77 77.5 74.5 79.4    Pt arrives having gained 26.5 lbs from previous visit 2 years ago. Pt states she wants to get back on the right regimen. Pt states she wants to lose about 20 lbs. Pt states she has fat around L3, L4, L5 limiting physical activity right now. Pt states she will go for consultation with water therapy in the near future. Pt states she gained weight shortly after having breast cancer 2 years ago.    Preferred Learning Style:   No preference indicated   Learning Readiness:   Ready  24-hr recall: B (AM): scrambled egg with cheese and yogurt OR Premier protein  shake and yogurt, coffee with SF creamer (22-32 g) greek yogurt (15g), protein shake (30g), coffee  Snk (AM): sometimes fruit L (PM): salad + chicken (21g)+ ranch dressing + crackers or  2-3 oz chicken on salad with veggies and cheese and Ranch or [place] or balsamic dressing (21 g) Snk (PM): pork rinds or fruit D (PM): 2-3 oz baked chicken/fish/shrimp with green beans, salad, or beans OR chili with ground [place] (14-21 g) Snk (PM): sugar-free popsicles   Fluid intake: 1 protein shake per day 11 oz, at least 50 oz water, 8 oz decaf coffee with sugar free hazelnut creamer and miralax  (64 oz+) Estimated total protein intake: ~70 g  Medications: see list Supplementation: taking  CBG monitoring: 1 x day  Average CBG per patient:  90-100 mg/dl Last patient reported A1c: 5.4  Using straws: No Drinking while eating: Not usually  Hair loss: No Carbonated beverages: No N/V/D/C: constipation getting better and taking benefiber every day Dumping syndrome: 1x at Christmas  Recent physical activity:  Walking outside for 45 minutes at least 3 days a week  Progress Towards Goal(s):  In progress.  Handouts given during visit include:  Vitamin and Mineral Recommendations    Nutritional Diagnosis:  -3.3 Overweight/obesity related to past poor dietary habits and physical inactivity as evidenced by patient w/ recent RYGB surgery following dietary guidelines for continued weight loss.    Intervention:  Nutrition education/diet reinforcement. Pt was educated and counseled on the ways to get  back on track with eating, physical activity, multivitamin, and calcium supplements. Pt was educated on the importance of weaning off protein shakes and replacing with real food.  Goals:  Follow Bariatric Surgery Specialized Post-Op Diet (ideas)  Aim for maximum of 15 grams of carbs per meal/10-15 grams per snack  Avoid Rossini starches   Avoid sweetened drinks  Eat 3-6 small meals/snacks, every 3-5  hrs  No meal skipping  Increase lean protein foods to meet 60-80g goal  Always have a protein source with carbs  Increase fluid intake to 64oz +  Aim for >30 min of physical activity daily    Resume daily vitamin supplementation   *(1) complete multivitamin with iron and 2-3 doses of 500 mg calcium citrate  * Make sure to separate the multivitamin and calcium by 2 hours to prevent iron/calcium from binding together and leaving your body  * Make sure to take your calcium in 3 separate doses because your body cannot absorb more than 500 mg at a time  Increase physical activity with water activities.   Get bariatric multivitamins today. See handout for options.   Wean off the protein shakes. Replace with real food for breakfast such as greek yogurt and egg.   Teaching Method Utilized:  Visual Auditory Hands on  Barriers to learning/adherence to lifestyle change: none  Demonstrated degree of understanding via:  Teach Back   Monitoring/Evaluation:  Dietary intake, exercise, and body weight. Follow up in 6 weeks for 3 year post-op visit.

## 2018-01-17 NOTE — Patient Instructions (Addendum)
Goals:  Follow Bariatric Surgery Specialized Post-Op Diet (ideas)  Aim for maximum of 15 grams of carbs per meal/10-15 grams per snack  Avoid [place] starches   Avoid sweetened drinks  Eat 3-6 small meals/snacks, every 3-5 hrs  No meal skipping  Increase lean protein foods to meet 60-80g goal  Always have a protein source with carbs  Increase fluid intake to 64oz +  Aim for >30 min of physical activity daily    Resume daily vitamin supplementation   *(1) complete multivitamin with iron and 2-3 doses of 500 mg calcium citrate  * Make sure to separate the multivitamin and calcium by 2 hours to prevent iron/calcium from binding together and leaving your body  * Make sure to take your calcium in 3 separate doses because your body cannot absorb more than 500 mg at a time   - Increase physical activity with water activities.   - Get bariatric multivitamins today. See handout for options.   - Wean off the protein shakes. Replace with real food for breakfast such as greek yogurt and egg.

## 2018-01-18 DIAGNOSIS — M25552 Pain in left hip: Secondary | ICD-10-CM | POA: Diagnosis not present

## 2018-01-18 DIAGNOSIS — M545 Low back pain: Secondary | ICD-10-CM | POA: Diagnosis not present

## 2018-01-19 ENCOUNTER — Telehealth: Payer: Self-pay | Admitting: Internal Medicine

## 2018-01-19 NOTE — Telephone Encounter (Signed)
Patient called and she says her cancer doctor took her off of Celebrex and told her not to take NSAIDS. She is wanting to know if there is something else that can be prescribed for her to take for the inflammation and pain during the day, because the Tramadol makes her drowsy. She would like something prescribed today, if possible. I advised I will send this to the provider for review and someone will call with his recommendation, she verbalized understanding.  Pharmacy: [id number] - [address], [place] [phone] (Phone) [phone] (Fax)

## 2018-01-19 NOTE — Telephone Encounter (Signed)
Unfortunately, all that is left Tylenol 500mg  - 2 every 8 hrs as needed, thanks

## 2018-01-19 NOTE — Telephone Encounter (Signed)
Copied from [place] [id number]. Topic: Quick Communication - Rx Refill/Question >> Jan 19, 2018 12:57 PM Boyd Kerbs wrote: Medication:   celecoxib (CELEBREX) 200 MG capsule Her cancer dr. Sherrin Daisy took her off of Celebrex. She is asking if can prescribe something for inflamation and pain.   She is taking Tramodole but makes her drossiness and needs something to help with pain during the day.  She is needing this today if possible    Has the patient contacted their pharmacy? No. (Agent: If no, request that the patient contact the pharmacy for the refill.) (Agent: If yes, when and what did the pharmacy advise?)  Preferred Pharmacy (with phone number or street name):   [id number] [address], [place] [address] Phone: [phone] Fax: [phone]    Agent: Please be advised that RX refills may take up to 3 business days. We ask that you follow-up with your pharmacy.

## 2018-01-23 DIAGNOSIS — M545 Low back pain: Secondary | ICD-10-CM | POA: Diagnosis not present

## 2018-01-23 DIAGNOSIS — M25552 Pain in left hip: Secondary | ICD-10-CM | POA: Diagnosis not present

## 2018-01-30 DIAGNOSIS — M545 Low back pain: Secondary | ICD-10-CM | POA: Diagnosis not present

## 2018-01-30 DIAGNOSIS — M25552 Pain in left hip: Secondary | ICD-10-CM | POA: Diagnosis not present

## 2018-02-01 DIAGNOSIS — M25552 Pain in left hip: Secondary | ICD-10-CM | POA: Diagnosis not present

## 2018-02-01 DIAGNOSIS — M545 Low back pain: Secondary | ICD-10-CM | POA: Diagnosis not present

## 2018-02-06 DIAGNOSIS — M25552 Pain in left hip: Secondary | ICD-10-CM | POA: Diagnosis not present

## 2018-02-06 DIAGNOSIS — M545 Low back pain: Secondary | ICD-10-CM | POA: Diagnosis not present

## 2018-02-13 ENCOUNTER — Encounter: Payer: Medicare Other | Attending: General Surgery | Admitting: Registered"

## 2018-02-13 ENCOUNTER — Encounter: Payer: Self-pay | Admitting: Registered"

## 2018-02-13 DIAGNOSIS — Z6830 Body mass index (BMI) 30.0-30.9, adult: Secondary | ICD-10-CM | POA: Insufficient documentation

## 2018-02-13 DIAGNOSIS — E119 Type 2 diabetes mellitus without complications: Secondary | ICD-10-CM

## 2018-02-13 DIAGNOSIS — Z713 Dietary counseling and surveillance: Secondary | ICD-10-CM | POA: Diagnosis not present

## 2018-02-13 DIAGNOSIS — E669 Obesity, unspecified: Secondary | ICD-10-CM | POA: Insufficient documentation

## 2018-02-13 NOTE — Progress Notes (Signed)
Follow-up visit: 3 Years Post-Operative RYGB Surgery  Medical Nutrition Therapy:  Appt start time: 9:15 end time: 9:45  Primary concerns today: Post-operative Bariatric Surgery Nutrition Management.   Non-scale victories: being more active, feeling better about herself mentally; getting healthier with cholesterol, blood pressure, and diabetes   Surgery date: 12/08/14 Surgery type: RYGB Start weight at [facility]: 262.5 lbs on 04/07/14 Weight today: 211.8 lbs Weight change: 6.2 lbs loss from 218 (01/17/2018) Total weight loss: 50.7 lbs Weight loss goals: improve health   TANITA  BODY COMP RESULTS  11/24/14 12/30/14 02/11/15 03/16/15 04/28/15 06/09/15 07/22/15 09/16/15 11/11/15 01/17/2018 02/13/2018   BMI (kg/m^2) 43.9 41.2 38.5 36.9 34.9 33.1 32.7 33.2 32.9 37.4 36.4   Fat Mass (lbs) 139 124.0 109.5 103.0 91.0 84.0 85 87.5 90 107.6 105.4   Fat Free Mass (lbs) 116.5 116.0 115.0 112.0 112.5 109.0 105.5 106 101.5 110.4 106.4   Total Body Water (lbs) 85.5 85.0 84.0 82.0 82.5 80.0 77 77.5 74.5 79.4 76.4    Pt arrives having lost 6.2 lbs from previous visit. Pt states she has started doing water aerobics as physical therapy 2x/week. Pt states she wants to lose in particular areas. Pt states she does not tolerate corn, potatoes, peas, or pasta well. Pt states she can tolerate fruit.   Pt states she wants to get back on the right regimen. Pt states she wants to lose about 20 lbs. Pt states she has fat around L3, L4, L5 limiting physical activity right now. Pt states she gained weight shortly after having breast cancer 2 years ago.    Preferred Learning Style:   No preference indicated   Learning Readiness:   Ready  24-hr recall: B (AM): scrambled egg with cheese and yogurt OR Premier protein shake and yogurt, coffee with SF creamer (22-32 g) greek yogurt (15g), protein shake (30g), coffee  Snk (AM): sometimes fruit L (PM): salad + chicken (21g)+ ranch dressing + crackers or  2-3 oz chicken on salad with  veggies and cheese and Ranch or [place] or balsamic dressing (21 g) Snk (PM): pork rinds or fruit D (PM): 2-3 oz baked chicken/fish/shrimp with green beans, salad, or beans OR chili with ground [place] (14-21 g) Snk (PM): sugar-free popsicles   Fluid intake: 1 protein shake per day 11 oz, at least 50 oz water, 8 oz decaf coffee with sugar free hazelnut creamer and miralax;   (64 oz+) Estimated total protein intake: ~70 g  Medications: see list Supplementation: taking  CBG monitoring: 1 x day  Average CBG per patient:  90-100 mg/dl Last patient reported A1c: 5.4  Using straws: No Drinking while eating: Not usually  Hair loss: No Carbonated beverages: No N/V/D/C: constipation getting better and taking benefiber every day Dumping syndrome: 1x at Christmas   Recent physical activity:  Water aerobics (as physical therapy) 60 min, 2x/week   Progress Towards Goal(s):  In progress.  Handouts given during visit include:  Vitamin and Mineral Recommendations  Bariatric Advantage Nutritional Products    Nutritional Diagnosis:  [place]-3.3 Overweight/obesity related to past poor dietary habits and physical inactivity as evidenced by patient w/ recent RYGB surgery following dietary guidelines for continued weight loss.    Intervention:  Nutrition education/diet reinforcement. Pt was educated and counseled on the ways to get back on track with taking appropriate bariatric multivitamins and calcium supplements. Pt was also encouraged to keep up with the great habits already established. See goals. Pt was in agreement with goals listed.  Goals: - Take  multivitamin and calcium supplement with meals and snacks at least 2 hours away from one another.  - Keep up the great work!  Teaching Method Utilized:  Visual Auditory Hands on  Barriers to learning/adherence to lifestyle change: none  Demonstrated degree of understanding via:  Teach Back   Monitoring/Evaluation:  Dietary intake, exercise,  and body weight. Follow up in 1 month for 3 year post-op visit.

## 2018-02-13 NOTE — Patient Instructions (Signed)
-   Take multivitamin and calcium supplement with meals and snacks at least 2 hours away from one another.   - Keep up the great work!

## 2018-03-06 DIAGNOSIS — M545 Low back pain: Secondary | ICD-10-CM | POA: Diagnosis not present

## 2018-03-06 DIAGNOSIS — M25552 Pain in left hip: Secondary | ICD-10-CM | POA: Diagnosis not present

## 2018-03-15 ENCOUNTER — Encounter: Payer: Self-pay | Admitting: Internal Medicine

## 2018-03-19 ENCOUNTER — Telehealth: Payer: Self-pay

## 2018-03-19 NOTE — Telephone Encounter (Signed)
I spoke with Elizabeth Hawkins and went over her instructions for tomorrow. I resent her instructions thru [place] . Confirmed our location and time to be here.

## 2018-03-20 ENCOUNTER — Encounter: Payer: Self-pay | Admitting: Internal Medicine

## 2018-03-20 ENCOUNTER — Ambulatory Visit (AMBULATORY_SURGERY_CENTER): Payer: Medicare Other | Admitting: Internal Medicine

## 2018-03-20 ENCOUNTER — Ambulatory Visit: Payer: Self-pay | Admitting: Registered"

## 2018-03-20 VITALS — BP 171/73 | HR 54 | Temp 98.4°F | Resp 12 | Ht 64.0 in | Wt 214.0 lb

## 2018-03-20 DIAGNOSIS — E119 Type 2 diabetes mellitus without complications: Secondary | ICD-10-CM | POA: Diagnosis not present

## 2018-03-20 DIAGNOSIS — D120 Benign neoplasm of cecum: Secondary | ICD-10-CM | POA: Diagnosis not present

## 2018-03-20 DIAGNOSIS — Z86010 Personal history of colonic polyps: Secondary | ICD-10-CM | POA: Diagnosis not present

## 2018-03-20 DIAGNOSIS — I10 Essential (primary) hypertension: Secondary | ICD-10-CM | POA: Diagnosis not present

## 2018-03-20 DIAGNOSIS — D123 Benign neoplasm of transverse colon: Secondary | ICD-10-CM

## 2018-03-20 MED ORDER — SODIUM CHLORIDE 0.9 % IV SOLN: 500.0000 mL | Freq: Once | INTRAVENOUS | Status: DC

## 2018-03-20 NOTE — Progress Notes (Signed)
Report given to PACU, vss 

## 2018-03-20 NOTE — Progress Notes (Signed)
Pt's states no medical or surgical changes since previsit or office visit. 

## 2018-03-20 NOTE — Op Note (Signed)
[facility] Patient Name: Elizabeth Hawkins Procedure Date: 03/20/2018 2:35 PM MRN: [id number] Endoscopist: Gatha Mayer , MD Age: 62 Referring MD:  Date of Birth: [birth year] Gender: Female Account #: [id number] Procedure:                Colonoscopy Indications:              Surveillance: Personal history of adenomatous                            polyps on last colonoscopy > 5 years ago Medicines:                Propofol per Anesthesia, Monitored Anesthesia Care Procedure:                Pre-Anesthesia Assessment:                           - Prior to the procedure, a History and Physical                            was performed, and patient medications and                            allergies were reviewed. The patient's tolerance of                            previous anesthesia was also reviewed. The risks                            and benefits of the procedure and the sedation                            options and risks were discussed with the patient.                            All questions were answered, and informed consent                            was obtained. Prior Anticoagulants: The patient has                            taken no previous anticoagulant or antiplatelet                            agents. ASA Grade Assessment: II - A patient with                            mild systemic disease. After reviewing the risks                            and benefits, the patient was deemed in                            satisfactory condition to undergo the procedure.  After obtaining informed consent, the colonoscope                            was passed under direct vision. Throughout the                            procedure, the patient's blood pressure, pulse, and                            oxygen saturations were monitored continuously. The                            Model PCF-H190DL [id number]) scope was introduced   through the anus and advanced to the the cecum,                            identified by appendiceal orifice and ileocecal                            valve. The colonoscopy was performed without                            difficulty. The patient tolerated the procedure                            well. The quality of the bowel preparation was                            good. The ileocecal valve, appendiceal orifice, and                            rectum were photographed. The bowel preparation                            used was Miralax. Scope In: 2:42:30 PM Scope Out: 2:54:38 PM Scope Withdrawal Time: 0 hours 10 minutes 24 seconds  Total Procedure Duration: 0 hours 12 minutes 8 seconds  Findings:                 The perianal and digital rectal examinations were                            normal.                           A 5 mm polyp was found in the cecum. The polyp was                            sessile. The polyp was removed with a cold snare.                            Resection and retrieval were complete. Verification                            of patient identification for  the specimen was                            done. Estimated blood loss was minimal.                           A 1 to 2 mm polyp was found in the transverse                            colon. The polyp was sessile. The polyp was removed                            with a cold biopsy forceps. Resection and retrieval                            were complete. Verification of patient                            identification for the specimen was done. Estimated                            blood loss was minimal.                           The exam was otherwise without abnormality on                            direct and retroflexion views. Complications:            No immediate complications. Estimated Blood Loss:     Estimated blood loss was minimal. Impression:               - One 5 mm polyp in the cecum, removed with  a cold                            snare. Resected and retrieved.                           - One 1 to 2 mm polyp in the transverse colon,                            removed with a cold biopsy forceps. Resected and                            retrieved.                           - The examination was otherwise normal on direct                            and retroflexion views. Recommendation:           - Patient has a contact number available for  emergencies. The signs and symptoms of potential                            delayed complications were discussed with the                            patient. Return to normal activities tomorrow.                            Written discharge instructions were provided to the                            patient.                           - Resume previous diet.                           - Continue present medications.                           - Repeat colonoscopy is recommended for                            surveillance. The colonoscopy date will be                            determined after pathology results from today's                            exam become available for review. Gatha Mayer, MD 03/20/2018 3:02:47 PM This report has been signed electronically.

## 2018-03-20 NOTE — Progress Notes (Signed)
Called to room to assist during endoscopic procedure.  Patient ID and intended procedure confirmed with present staff. Received instructions for my participation in the procedure from the performing physician.  

## 2018-03-20 NOTE — Patient Instructions (Addendum)
I found and removed 2 tiny polyps.  I will let you know pathology results and when to have another routine colonoscopy by mail and/or My Chart.  I appreciate the opportunity to care for you. Gatha Mayer, MD, FACG  YOU HAD AN ENDOSCOPIC PROCEDURE TODAY AT [facility]:   Refer to the procedure report that was given to you for any specific questions about what was found during the examination.  If the procedure report does not answer your questions, please call your gastroenterologist to clarify.  If you requested that your care partner not be given the details of your procedure findings, then the procedure report has been included in a sealed envelope for you to review at your convenience later.  YOU SHOULD EXPECT: Some feelings of bloating in the abdomen. Passage of more gas than usual.  Walking can help get rid of the air that was put into your GI tract during the procedure and reduce the bloating. If you had a lower endoscopy (such as a colonoscopy or flexible sigmoidoscopy) you may notice spotting of blood in your stool or on the toilet paper. If you underwent a bowel prep for your procedure, you may not have a normal bowel movement for a few days.  Please Note:  You might notice some irritation and congestion in your nose or some drainage.  This is from the oxygen used during your procedure.  There is no need for concern and it should clear up in a day or so.  SYMPTOMS TO REPORT IMMEDIATELY:   Following lower endoscopy (colonoscopy or flexible sigmoidoscopy):  Excessive amounts of blood in the stool  Significant tenderness or worsening of abdominal pains  Swelling of the abdomen that is new, acute  Fever of 100F or higher  Please see handouts given to you on polyps.  For urgent or emergent issues, a gastroenterologist can be reached at any hour by calling [phone].   DIET:  We do recommend a small meal at first, but then you may proceed to your  regular diet.  Drink plenty of fluids but you should avoid alcoholic beverages for 24 hours.  ACTIVITY:  You should plan to take it easy for the rest of today and you should NOT DRIVE or use heavy machinery until tomorrow (because of the sedation medicines used during the test).    FOLLOW UP: Our staff will call the number listed on your records the next business day following your procedure to check on you and address any questions or concerns that you may have regarding the information given to you following your procedure. If we do not reach you, we will leave a message.  However, if you are feeling well and you are not experiencing any problems, there is no need to return our call.  We will assume that you have returned to your regular daily activities without incident.  If any biopsies were taken you will be contacted by phone or by letter within the next 1-3 weeks.  Please call us at [phone] if you have not heard about the biopsies in 3 weeks.    SIGNATURES/CONFIDENTIALITY: You and/or your care partner have signed paperwork which will be entered into your electronic medical record.  These signatures attest to the fact that that the information above on your After Visit Summary has been reviewed and is understood.  Full responsibility of the confidentiality of this discharge information lies with you and/or your care-partner.  Thank you for letting  us take care of  Your healthcare needs today.

## 2018-03-21 ENCOUNTER — Telehealth: Payer: Self-pay | Admitting: *Deleted

## 2018-03-21 NOTE — Telephone Encounter (Signed)
  Follow up Call-  Call back number 03/20/2018  Post procedure Call Back phone  # [phone]  Permission to leave phone message Yes  Some recent data might be hidden     Patient questions:  Do you have a fever, pain , or abdominal swelling? No. Pain Score  0 *  Have you tolerated food without any problems? Yes.    Have you been able to return to your normal activities? Yes.    Do you have any questions about your discharge instructions: Diet   No. Medications  No. Follow up visit  No.  Do you have questions or concerns about your Care? No.  Actions: * If pain score is 4 or above: No action needed, pain <4.

## 2018-03-27 DIAGNOSIS — M545 Low back pain: Secondary | ICD-10-CM | POA: Diagnosis not present

## 2018-03-27 DIAGNOSIS — M25552 Pain in left hip: Secondary | ICD-10-CM | POA: Diagnosis not present

## 2018-04-01 ENCOUNTER — Encounter: Payer: Self-pay | Admitting: Internal Medicine

## 2018-04-01 NOTE — Progress Notes (Signed)
2 diminutive adenomas Recall 2024 My Chart

## 2018-04-17 ENCOUNTER — Ambulatory Visit: Payer: Self-pay | Admitting: Internal Medicine

## 2018-04-17 ENCOUNTER — Ambulatory Visit (INDEPENDENT_AMBULATORY_CARE_PROVIDER_SITE_OTHER)
Admission: RE | Admit: 2018-04-17 | Discharge: 2018-04-17 | Disposition: A | Discharge status: Home / Self Care | Payer: Medicare Other | Source: Ambulatory Visit | Attending: Internal Medicine | Admitting: Internal Medicine

## 2018-04-17 ENCOUNTER — Ambulatory Visit (INDEPENDENT_AMBULATORY_CARE_PROVIDER_SITE_OTHER): Payer: Medicare Other | Admitting: Internal Medicine

## 2018-04-17 ENCOUNTER — Encounter: Payer: Self-pay | Admitting: Internal Medicine

## 2018-04-17 VITALS — BP 118/76 | HR 80 | Temp 98.4°F | Ht 64.0 in | Wt 216.0 lb

## 2018-04-17 DIAGNOSIS — J069 Acute upper respiratory infection, unspecified: Secondary | ICD-10-CM

## 2018-04-17 DIAGNOSIS — G4700 Insomnia, unspecified: Secondary | ICD-10-CM | POA: Diagnosis not present

## 2018-04-17 DIAGNOSIS — R0789 Other chest pain: Secondary | ICD-10-CM | POA: Diagnosis not present

## 2018-04-17 MED ORDER — FLUCONAZOLE 150 MG PO TABS: ORAL_TABLET | ORAL | 1 refills | Status: DC

## 2018-04-17 MED ORDER — ZOLPIDEM TARTRATE 10 MG PO TABS: 10.0000 mg | ORAL_TABLET | Freq: Every evening | ORAL | 2 refills | Status: DC | PRN

## 2018-04-17 MED ORDER — AZITHROMYCIN 250 MG PO TABS: ORAL_TABLET | ORAL | 1 refills | Status: DC

## 2018-04-17 NOTE — Assessment & Plan Note (Signed)
Mild to mod, for antibx course,  to f/u any worsening symptoms or concerns 

## 2018-04-17 NOTE — Assessment & Plan Note (Signed)
Exam benign, unlikely cardiac, pt declines ecg or cxr,  to f/u any worsening symptoms or concerns
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
## 2018-04-17 NOTE — Telephone Encounter (Signed)
Patient reports she is having some changes in her breathing that started last night. Patient states she feels a pulling sensation on the right side of her neck and chest when she takes a deep breath. She states she has an inhaler- but it is not helping. Appointment for evaluation   Reason for Disposition . [1] MILD difficulty breathing (e.g., minimal/no SOB at rest, SOB with walking, pulse <100) AND [2] NEW-onset or WORSE than normal  Answer Assessment - Initial Assessment Questions 1. RESPIRATORY STATUS: "Describe your breathing?" (e.g., wheezing, shortness of breath, unable to speak, severe coughing)      When patient takes deep breath she feels pulling sensation on right- patient does not feel like she is getting enough air in 2. ONSET: "When did this breathing problem begin?"      Started last night 3. PATTERN "Does the difficult breathing come and go, or has it been constant since it started?"      Constant- since last 4. SEVERITY: "How bad is your breathing?" (e.g., mild, moderate, severe)    - MILD: No SOB at rest, mild SOB with walking, speaks normally in sentences, can lay down, no retractions, pulse < 100.    - MODERATE: SOB at rest, SOB with minimal exertion and prefers to sit, cannot lie down flat, speaks in phrases, mild retractions, audible wheezing, pulse 100-120.    - SEVERE: Very SOB at rest, speaks in single words, struggling to breathe, sitting hunched forward, retractions, pulse > 120      mild 5. RECURRENT SYMPTOM: "Have you had difficulty breathing before?" If so, ask: "When was the last time?" and "What happened that time?"      Not like this- feels like she has heaviness on chest 6. CARDIAC HISTORY: "Do you have any history of heart disease?" (e.g., heart attack, angina, bypass surgery, angioplasty)      no 7. LUNG HISTORY: "Do you have any history of lung disease?"  (e.g., pulmonary embolus, asthma, emphysema)     No- patient does have inhaler 8. CAUSE: "What do you  think is causing the breathing problem?"      unknown 9. OTHER SYMPTOMS: "Do you have any other symptoms? (e.g., dizziness, runny nose, cough, chest pain, fever)     headache 10. PREGNANCY: "Is there any chance you are pregnant?" "When was your last menstrual period?"       n/a 11. TRAVEL: "Have you traveled out of the country in the last month?" (e.g., travel history, exposures)       no  Protocols used: BREATHING DIFFICULTY-A-AH

## 2018-04-17 NOTE — Assessment & Plan Note (Signed)
Ok for ambien qhs prn,  to f/u any worsening symptoms or concerns  

## 2018-04-17 NOTE — Telephone Encounter (Signed)
Noted.  Dr. John FYI 

## 2018-04-17 NOTE — Patient Instructions (Signed)
Please take all new medication as prescribed - the antibiotic, diflucan if needed, and ambien for sleep as needed  Please continue all other medications as before, and refills have been done if requested.  Please have the pharmacy call with any other refills you may need.  Please keep your appointments with your specialists as you may have planned  Please go to the XRAY Department in the Basement (go straight as you get off the elevator) for the x-ray testing  You will be contacted by phone if any changes need to be made immediately.  Otherwise, you will receive a letter about your results with an explanation, but please check with MyChart first.  Please remember to sign up for MyChart if you have not done so, as this will be important to you in the future with finding out test results, communicating by private email, and scheduling acute appointments online when needed.

## 2018-06-27 ENCOUNTER — Ambulatory Visit: Payer: Self-pay | Admitting: Internal Medicine

## 2018-07-10 ENCOUNTER — Encounter: Payer: Self-pay | Admitting: Internal Medicine

## 2018-07-10 DIAGNOSIS — H2513 Age-related nuclear cataract, bilateral: Secondary | ICD-10-CM | POA: Diagnosis not present

## 2018-07-10 DIAGNOSIS — H43811 Vitreous degeneration, right eye: Secondary | ICD-10-CM | POA: Diagnosis not present

## 2018-07-10 DIAGNOSIS — D0511 Intraductal carcinoma in situ of right breast: Secondary | ICD-10-CM | POA: Diagnosis not present

## 2018-07-10 DIAGNOSIS — Z01419 Encounter for gynecological examination (general) (routine) without abnormal findings: Secondary | ICD-10-CM | POA: Diagnosis not present

## 2018-07-10 DIAGNOSIS — Z6837 Body mass index (BMI) 37.0-37.9, adult: Secondary | ICD-10-CM | POA: Diagnosis not present

## 2018-07-10 DIAGNOSIS — H524 Presbyopia: Secondary | ICD-10-CM | POA: Diagnosis not present

## 2018-07-10 DIAGNOSIS — Z808 Family history of malignant neoplasm of other organs or systems: Secondary | ICD-10-CM | POA: Diagnosis not present

## 2018-07-10 DIAGNOSIS — Z806 Family history of leukemia: Secondary | ICD-10-CM | POA: Diagnosis not present

## 2018-07-10 DIAGNOSIS — E119 Type 2 diabetes mellitus without complications: Secondary | ICD-10-CM | POA: Diagnosis not present

## 2018-07-10 DIAGNOSIS — Z86000 Personal history of in-situ neoplasm of breast: Secondary | ICD-10-CM | POA: Diagnosis not present

## 2018-07-10 LAB — HM DIABETES EYE EXAM

## 2018-07-25 ENCOUNTER — Other Ambulatory Visit: Payer: Medicare Other

## 2018-07-25 ENCOUNTER — Telehealth: Payer: Self-pay

## 2018-07-25 DIAGNOSIS — I10 Essential (primary) hypertension: Secondary | ICD-10-CM

## 2018-07-25 DIAGNOSIS — Z0000 Encounter for general adult medical examination without abnormal findings: Secondary | ICD-10-CM

## 2018-07-25 NOTE — Telephone Encounter (Signed)
Annual lab work needed to be entered

## 2018-07-26 ENCOUNTER — Encounter: Payer: Self-pay | Admitting: Internal Medicine

## 2018-07-26 ENCOUNTER — Ambulatory Visit (INDEPENDENT_AMBULATORY_CARE_PROVIDER_SITE_OTHER): Payer: Medicare Other | Admitting: Internal Medicine

## 2018-07-26 ENCOUNTER — Other Ambulatory Visit (INDEPENDENT_AMBULATORY_CARE_PROVIDER_SITE_OTHER): Payer: Medicare Other

## 2018-07-26 VITALS — BP 124/80 | HR 91 | Temp 98.6°F | Ht 64.0 in | Wt 219.0 lb

## 2018-07-26 DIAGNOSIS — E038 Other specified hypothyroidism: Secondary | ICD-10-CM

## 2018-07-26 DIAGNOSIS — Z0000 Encounter for general adult medical examination without abnormal findings: Secondary | ICD-10-CM

## 2018-07-26 DIAGNOSIS — K5909 Other constipation: Secondary | ICD-10-CM

## 2018-07-26 DIAGNOSIS — I10 Essential (primary) hypertension: Secondary | ICD-10-CM | POA: Diagnosis not present

## 2018-07-26 DIAGNOSIS — E785 Hyperlipidemia, unspecified: Secondary | ICD-10-CM

## 2018-07-26 DIAGNOSIS — E119 Type 2 diabetes mellitus without complications: Secondary | ICD-10-CM | POA: Diagnosis not present

## 2018-07-26 LAB — LIPID PANEL
Cholesterol: 177 mg/dL (ref 0–200)
HDL: 71.5 mg/dL (ref >39.00)
LDL Cholesterol: 87 mg/dL (ref 0–99)
NonHDL: 105.41
Total CHOL/HDL Ratio: 2
Triglycerides: 94 mg/dL (ref 0.0–149.0)
VLDL: 18.8 mg/dL (ref 0.0–40.0)

## 2018-07-26 LAB — BASIC METABOLIC PANEL
BUN: 17 mg/dL (ref 6–23)
CO2: 26 mEq/L (ref 19–32)
Calcium: 9.2 mg/dL (ref 8.4–10.5)
Chloride: 106 mEq/L (ref 96–112)
Creatinine, Ser: 0.9 mg/dL (ref 0.40–1.20)
GFR: 81.74 mL/min (ref >60.00)
Glucose, Bld: 107 mg/dL — ABNORMAL HIGH (ref 70–99)
Potassium: 4.1 mEq/L (ref 3.5–5.1)
Sodium: 141 mEq/L (ref 135–145)

## 2018-07-26 LAB — CBC WITH DIFFERENTIAL/PLATELET
Basophils Absolute: 0 10*3/uL (ref 0.0–0.1)
Basophils Relative: 1 % (ref 0.0–3.0)
Eosinophils Absolute: 0.1 10*3/uL (ref 0.0–0.7)
Eosinophils Relative: 2.5 % (ref 0.0–5.0)
HCT: 37.6 % (ref 36.0–46.0)
Hemoglobin: 11.6 g/dL — ABNORMAL LOW (ref 12.0–15.0)
LYMPHS ABS: 2.1 10*3/uL (ref 0.7–4.0)
Lymphocytes Relative: 46 % (ref 12.0–46.0)
MCHC: 30.9 g/dL (ref 30.0–36.0)
MCV: 75.5 fl — ABNORMAL LOW (ref 78.0–100.0)
Monocytes Absolute: 0.4 10*3/uL (ref 0.1–1.0)
Monocytes Relative: 8.5 % (ref 3.0–12.0)
NEUTROS PCT: 42 % — AB (ref 43.0–77.0)
Neutro Abs: 1.9 10*3/uL (ref 1.4–7.7)
Platelets: 207 10*3/uL (ref 150.0–400.0)
RBC: 4.98 Mil/uL (ref 3.87–5.11)
RDW: 14.6 % (ref 11.5–15.5)
WBC: 4.6 10*3/uL (ref 4.0–10.5)

## 2018-07-26 LAB — HEPATIC FUNCTION PANEL
ALK PHOS: 53 U/L (ref 39–117)
ALT: 15 U/L (ref 0–35)
AST: 19 U/L (ref 0–37)
Albumin: 4.1 g/dL (ref 3.5–5.2)
Bilirubin, Direct: 0.1 mg/dL (ref 0.0–0.3)
Total Bilirubin: 0.4 mg/dL (ref 0.2–1.2)
Total Protein: 7.5 g/dL (ref 6.0–8.3)

## 2018-07-26 LAB — TSH: TSH: 5.9 u[IU]/mL — ABNORMAL HIGH (ref 0.35–4.50)

## 2018-07-26 MED ORDER — LINACLOTIDE 72 MCG PO CAPS: 72.0000 ug | ORAL_CAPSULE | Freq: Every day | ORAL | 1 refills | Status: DC

## 2018-07-26 NOTE — Assessment & Plan Note (Signed)
stable overall by history and exam, recent data reviewed with pt, and pt to continue medical treatment as before,  to f/u any worsening symptoms or concerns  

## 2018-07-26 NOTE — Assessment & Plan Note (Signed)
Ok for low dose linzess asd,   to f/u any worsening symptoms or concerns
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
## 2018-07-26 NOTE — Patient Instructions (Addendum)
Please take all new medication as prescribed - the linzess  Please continue all other medications as before, and refills have been done if requested.  Please have the pharmacy call with any other refills you may need.  Please continue your efforts at being more active, low cholesterol diet, and weight control.  Please keep your appointments with your specialists as you may have planned  We will let you know about your lab work, and forward a copy to Dr Matthew Saras as well  Please return in 6 months, or sooner if needed

## 2018-08-15 ENCOUNTER — Encounter: Payer: Self-pay | Admitting: Internal Medicine

## 2018-08-24 ENCOUNTER — Ambulatory Visit (INDEPENDENT_AMBULATORY_CARE_PROVIDER_SITE_OTHER): Payer: Medicare Other | Admitting: Family

## 2018-08-24 ENCOUNTER — Encounter: Payer: Self-pay | Admitting: Family

## 2018-08-24 VITALS — BP 126/78 | HR 74 | Temp 98.7°F | Ht 64.0 in | Wt 220.1 lb

## 2018-08-24 DIAGNOSIS — M545 Low back pain, unspecified: Secondary | ICD-10-CM

## 2018-08-24 MED ORDER — METHOCARBAMOL 500 MG PO TABS: 500.0000 mg | ORAL_TABLET | Freq: Three times a day (TID) | ORAL | 0 refills | Status: DC | PRN

## 2018-08-24 MED ORDER — TRAMADOL HCL 50 MG PO TABS: 50.0000 mg | ORAL_TABLET | Freq: Three times a day (TID) | ORAL | 0 refills | Status: DC | PRN

## 2018-08-24 NOTE — Patient Instructions (Signed)
Acute Back Pain, Adult  Acute back pain is sudden and usually short-lived. It is often caused by an injury to the muscles and tissues in the back. The injury may result from:   A muscle or ligament getting overstretched or torn (strained). Ligaments are tissues that connect bones to each other. Lifting something improperly can cause a back strain.   Wear and tear (degeneration) of the spinal disks. Spinal disks are circular tissue that provides cushioning between the bones of the spine (vertebrae).   Twisting motions, such as while playing sports or doing yard work.   A hit to the back.   Arthritis.  You may have a physical exam, lab tests, and imaging tests to find the cause of your pain. Acute back pain usually goes away with rest and home care.  Follow these instructions at home:  Managing pain, stiffness, and swelling   Take over-the-counter and prescription medicines only as told by your health care provider.   Your health care provider may recommend applying ice during the first 24-48 hours after your pain starts. To do this:  ? Put ice in a plastic bag.  ? Place a towel between your skin and the bag.  ? Leave the ice on for 20 minutes, 2-3 times a day.   If directed, apply heat to the affected area as often as told by your health care provider. Use the heat source that your health care provider recommends, such as a moist heat pack or a heating pad.  ? Place a towel between your skin and the heat source.  ? Leave the heat on for 20-30 minutes.  ? Remove the heat if your skin turns bright red. This is especially important if you are unable to feel pain, heat, or cold. You have a greater risk of getting burned.  Activity     Do not stay in bed. Staying in bed for more than 1-2 days can delay your recovery.   Sit up and stand up straight. Avoid leaning forward when you sit, or hunching over when you stand.  ? If you work at a desk, sit close to it so you do not need to lean over. Keep your chin tucked  in. Keep your neck drawn back, and keep your elbows bent at a right angle. Your arms should look like the letter "L."  ? Sit high and close to the steering wheel when you drive. Add lower back (lumbar) support to your car seat, if needed.   Take short walks on even surfaces as soon as you are able. Try to increase the length of time you walk each day.   Do not sit, drive, or stand in one place for more than 30 minutes at a time. Sitting or standing for long periods of time can put stress on your back.   Do not drive or use heavy machinery while taking prescription pain medicine.   Use proper lifting techniques. When you bend and lift, use positions that put less stress on your back:  ? Bend your knees.  ? Keep the load close to your body.  ? Avoid twisting.   Exercise regularly as told by your health care provider. Exercising helps your back heal faster and helps prevent back injuries by keeping muscles strong and flexible.   Work with a physical therapist to make a safe exercise program, as recommended by your health care provider. Do any exercises as told by your physical therapist.  Lifestyle   Maintain   a healthy weight. Extra weight puts stress on your back and makes it difficult to have good posture.   Avoid activities or situations that make you feel anxious or stressed. Stress and anxiety increase muscle tension and can make back pain worse. Learn ways to manage anxiety and stress, such as through exercise.  General instructions   Sleep on a firm mattress in a comfortable position. Try lying on your side with your knees slightly bent. If you lie on your back, put a pillow under your knees.   Follow your treatment plan as told by your health care provider. This may include:  ? Cognitive or behavioral therapy.  ? Acupuncture or massage therapy.  ? Meditation or yoga.  Contact a health care provider if:   You have pain that is not relieved with rest or medicine.   You have increasing pain going down  into your legs or buttocks.   Your pain does not improve after 2 weeks.   You have pain at night.   You lose weight without trying.   You have a fever or chills.  Get help right away if:   You develop new bowel or bladder control problems.   You have unusual weakness or numbness in your arms or legs.   You develop nausea or vomiting.   You develop abdominal pain.   You feel faint.  Summary   Acute back pain is sudden and usually short-lived.   Use proper lifting techniques. When you bend and lift, use positions that put less stress on your back.   Take over-the-counter and prescription medicines and apply heat or ice as directed by your health care provider.  This information is not intended to replace advice given to you by your health care provider. Make sure you discuss any questions you have with your health care provider.  Document Released: 07/25/2005 Document Revised: 03/01/2018 Document Reviewed: 03/08/2017  Elsevier Interactive Patient Education  2019 [employer]
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
## 2018-09-03 ENCOUNTER — Encounter: Payer: Self-pay | Admitting: Family

## 2018-10-18 ENCOUNTER — Ambulatory Visit: Payer: Self-pay | Admitting: General Surgery

## 2018-10-18 DIAGNOSIS — Z9884 Bariatric surgery status: Secondary | ICD-10-CM | POA: Diagnosis not present

## 2018-10-20 ENCOUNTER — Other Ambulatory Visit: Payer: Self-pay | Admitting: Internal Medicine
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
## 2018-10-23 ENCOUNTER — Other Ambulatory Visit: Payer: Self-pay

## 2018-10-23 ENCOUNTER — Ambulatory Visit (INDEPENDENT_AMBULATORY_CARE_PROVIDER_SITE_OTHER): Payer: Medicare Other | Admitting: *Deleted

## 2018-10-23 VITALS — BP 124/62 | HR 69 | Temp 98.5°F | Resp 17 | Ht 64.0 in | Wt 223.0 lb

## 2018-10-23 DIAGNOSIS — E119 Type 2 diabetes mellitus without complications: Secondary | ICD-10-CM

## 2018-10-23 DIAGNOSIS — Z0000 Encounter for general adult medical examination without abnormal findings: Secondary | ICD-10-CM | POA: Diagnosis not present

## 2018-10-23 DIAGNOSIS — Z23 Encounter for immunization: Secondary | ICD-10-CM | POA: Diagnosis not present

## 2018-10-23 NOTE — Patient Instructions (Signed)
If you cannot attend class in person, you can still exercise at home. Video taped versions of AHOY classes are shown on [employer] (GTN) at 8 am and 1 pm Mondays through Fridays. You can also purchase a copy of the AHOY DVD by calling [place] (GTN) [employer]. GTN is available on [employer] channel 13 with a digital cable box and on [employer] channel 31. GTN is also available on [employer], channel 99. To view GTN, go to channel 99, press OK, select [place], then select GTN to start the channel.  Continue doing brain stimulating activities (puzzles, reading, adult coloring books, staying active) to keep memory sharp.   Continue to eat heart healthy diet (full of fruits, vegetables, whole grains, lean protein, water--limit salt, fat, and sugar intake) and increase physical activity as tolerated.   Elizabeth Hawkins , Thank you for taking time to come for your Medicare Wellness Visit. I appreciate your ongoing commitment to your health goals. Please review the following plan we discussed and let me know if I can assist you in the future.   These are the goals we discussed: Goals    . lose 15 pounds     Exercise, start to walk, get back to being active in the community.     . Patient Stated     I will increase my physical activity by doing upper arms, chair exercises, and physical therapy hip exercises    . Patient Stated     I want to increase my physical activity by walking and streaming in exercise programs to my TV.       This is a list of the screening recommended for you and due dates:  Health Maintenance  Topic Date Due  . Pneumococcal vaccine  02/14/1959  . HIV Screening  02/14/1972  . Mammogram  08/08/2016  . Hemoglobin A1C  04/03/2018  . Eye exam for diabetics  08/08/2018  . Complete foot exam   10/04/2018  . Urine Protein Check  10/04/2018  . Flu Shot  11/07/2018*  . Colon Cancer Screening  03/21/2023  . Tetanus Vaccine   08/09/2026  .  Hepatitis C: One time screening is recommended by Center for Disease Control  (CDC) for  adults born from [birth year] through 1964.   Completed  *Topic was postponed. The date shown is not the original due date.   Health Maintenance, Female Adopting a healthy lifestyle and getting preventive care can go a long way to promote health and wellness. Talk with your health care provider about what schedule of regular examinations is right for you. This is a good chance for you to check in with your provider about disease prevention and staying healthy. In between checkups, there are plenty of things you can do on your own. Experts have done a lot of research about which lifestyle changes and preventive measures are most likely to keep you healthy. Ask your health care provider for more information. Weight and diet Eat a healthy diet  Be sure to include plenty of vegetables, fruits, low-fat dairy products, and lean protein.  Do not eat a lot of foods high in solid fats, added sugars, or salt.  Get regular exercise. This is one of the most important things you can do for your health. ? Most adults should exercise for at least 150 minutes each week. The exercise should increase your heart rate and make you sweat (moderate-intensity exercise). ? Most adults should also do strengthening exercises at least  twice a week. This is in addition to the moderate-intensity exercise. Maintain a healthy weight  Body mass index (BMI) is a measurement that can be used to identify possible weight problems. It estimates body fat based on height and weight. Your health care provider can help determine your BMI and help you achieve or maintain a healthy weight.  For females 33 years of age and older: ? A BMI below 18.5 is considered underweight. ? A BMI of 18.5 to 24.9 is normal. ? A BMI of 25 to 29.9 is considered overweight. ? A BMI of 30 and above is considered obese. Watch levels of cholesterol and blood  lipids  You should start having your blood tested for lipids and cholesterol at 63 years of age, then have this test every 5 years.  You may need to have your cholesterol levels checked more often if: ? Your lipid or cholesterol levels are high. ? You are older than 62 years of age. ? You are at high risk for heart disease. Cancer screening Lung Cancer  Lung cancer screening is recommended for adults 77-20 years old who are at high risk for lung cancer because of a history of smoking.  A yearly low-dose CT scan of the lungs is recommended for people who: ? Currently smoke. ? Have quit within the past 15 years. ? Have at least a 30-pack-year history of smoking. A pack year is smoking an average of one pack of cigarettes a day for 1 year.  Yearly screening should continue until it has been 15 years since you quit.  Yearly screening should stop if you develop a health problem that would prevent you from having lung cancer treatment. Breast Cancer  Practice breast self-awareness. This means understanding how your breasts normally appear and feel.  It also means doing regular breast self-exams. Let your health care provider know about any changes, no matter how small.  If you are in your 20s or 30s, you should have a clinical breast exam (CBE) by a health care provider every 1-3 years as part of a regular health exam.  If you are 20 or older, have a CBE every year. Also consider having a breast X-ray (mammogram) every year.  If you have a family history of breast cancer, talk to your health care provider about genetic screening.  If you are at high risk for breast cancer, talk to your health care provider about having an MRI and a mammogram every year.  Breast cancer gene (BRCA) assessment is recommended for women who have family members with BRCA-related cancers. BRCA-related cancers include: ? Breast. ? Ovarian. ? Tubal. ? Peritoneal cancers.  Results of the assessment will  determine the need for genetic counseling and BRCA1 and BRCA2 testing. Cervical Cancer Your health care provider may recommend that you be screened regularly for cancer of the pelvic organs (ovaries, uterus, and vagina). This screening involves a pelvic examination, including checking for microscopic changes to the surface of your cervix (Pap test). You may be encouraged to have this screening done every 3 years, beginning at age 73.  For women ages 64-65, health care providers may recommend pelvic exams and Pap testing every 3 years, or they may recommend the Pap and pelvic exam, combined with testing for human papilloma virus (HPV), every 5 years. Some types of HPV increase your risk of cervical cancer. Testing for HPV may also be done on women of any age with unclear Pap test results.  Other health care providers may  not recommend any screening for nonpregnant women who are considered low risk for pelvic cancer and who do not have symptoms. Ask your health care provider if a screening pelvic exam is right for you.  If you have had past treatment for cervical cancer or a condition that could lead to cancer, you need Pap tests and screening for cancer for at least 20 years after your treatment. If Pap tests have been discontinued, your risk factors (such as having a new sexual partner) need to be reassessed to determine if screening should resume. Some women have medical problems that increase the chance of getting cervical cancer. In these cases, your health care provider may recommend more frequent screening and Pap tests. Colorectal Cancer  This type of cancer can be detected and often prevented.  Routine colorectal cancer screening usually begins at 62 years of age and continues through 62 years of age.  Your health care provider may recommend screening at an earlier age if you have risk factors for colon cancer.  Your health care provider may also recommend using home test kits to check for  hidden blood in the stool.  A small camera at the end of a tube can be used to examine your colon directly (sigmoidoscopy or colonoscopy). This is done to check for the earliest forms of colorectal cancer.  Routine screening usually begins at age 29.  Direct examination of the colon should be repeated every 5-10 years through 62 years of age. However, you may need to be screened more often if early forms of precancerous polyps or small growths are found. Skin Cancer  Check your skin from head to toe regularly.  Tell your health care provider about any new moles or changes in moles, especially if there is a change in a mole's shape or color.  Also tell your health care provider if you have a mole that is larger than the size of a pencil eraser.  Always use sunscreen. Apply sunscreen liberally and repeatedly throughout the day.  Protect yourself by wearing long sleeves, pants, a wide-brimmed hat, and sunglasses whenever you are outside. Heart disease, diabetes, and high blood pressure  High blood pressure causes heart disease and increases the risk of stroke. High blood pressure is more likely to develop in: ? People who have blood pressure in the high end of the normal range (130-139/85-89 mm Hg). ? People who are overweight or obese. ? People who are African American.  If you are 60-17 years of age, have your blood pressure checked every 3-5 years. If you are 43 years of age or older, have your blood pressure checked every year. You should have your blood pressure measured twice-once when you are at a hospital or clinic, and once when you are not at a hospital or clinic. Record the average of the two measurements. To check your blood pressure when you are not at a hospital or clinic, you can use: ? An automated blood pressure machine at a pharmacy. ? A home blood pressure monitor.  If you are between 44 years and 22 years old, ask your health care provider if you should take aspirin to  prevent strokes.  Have regular diabetes screenings. This involves taking a blood sample to check your fasting blood sugar level. ? If you are at a normal weight and have a low risk for diabetes, have this test once every three years after 62 years of age. ? If you are overweight and have a high risk for  diabetes, consider being tested at a younger age or more often. Preventing infection Hepatitis B  If you have a higher risk for hepatitis B, you should be screened for this virus. You are considered at high risk for hepatitis B if: ? You were born in a country where hepatitis B is common. Ask your health care provider which countries are considered high risk. ? Your parents were born in a high-risk country, and you have not been immunized against hepatitis B (hepatitis B vaccine). ? You have HIV or AIDS. ? You use needles to inject street drugs. ? You live with someone who has hepatitis B. ? You have had sex with someone who has hepatitis B. ? You get hemodialysis treatment. ? You take certain medicines for conditions, including cancer, organ transplantation, and autoimmune conditions. Hepatitis C  Blood testing is recommended for: ? Everyone born from [birth year] through 1964. ? Anyone with known risk factors for hepatitis C. Sexually transmitted infections (STIs)  You should be screened for sexually transmitted infections (STIs) including gonorrhea and chlamydia if: ? You are sexually active and are younger than 62 years of age. ? You are older than 62 years of age and your health care provider tells you that you are at risk for this type of infection. ? Your sexual activity has changed since you were last screened and you are at an increased risk for chlamydia or gonorrhea. Ask your health care provider if you are at risk.  If you do not have HIV, but are at risk, it may be recommended that you take a prescription medicine daily to prevent HIV infection. This is called pre-exposure  prophylaxis (PrEP). You are considered at risk if: ? You are sexually active and do not regularly use condoms or know the HIV status of your partner(s). ? You take drugs by injection. ? You are sexually active with a partner who has HIV. Talk with your health care provider about whether you are at high risk of being infected with HIV. If you choose to begin PrEP, you should first be tested for HIV. You should then be tested every 3 months for as long as you are taking PrEP. Pregnancy  If you are premenopausal and you may become pregnant, ask your health care provider about preconception counseling.  If you may become pregnant, take 400 to 800 micrograms (mcg) of folic acid every day.  If you want to prevent pregnancy, talk to your health care provider about birth control (contraception). Osteoporosis and menopause  Osteoporosis is a disease in which the bones lose minerals and strength with aging. This can result in serious bone fractures. Your risk for osteoporosis can be identified using a bone density scan.  If you are 77 years of age or older, or if you are at risk for osteoporosis and fractures, ask your health care provider if you should be screened.  Ask your health care provider whether you should take a calcium or vitamin D supplement to lower your risk for osteoporosis.  Menopause may have certain physical symptoms and risks.  Hormone replacement therapy may reduce some of these symptoms and risks. Talk to your health care provider about whether hormone replacement therapy is right for you. Follow these instructions at home:  Schedule regular health, dental, and eye exams.  Stay current with your immunizations.  Do not use any tobacco products including cigarettes, chewing tobacco, or electronic cigarettes.  If you are pregnant, do not drink alcohol.  If you are breastfeeding,  limit how much and how often you drink alcohol.  Limit alcohol intake to no more than 1 drink  per day for nonpregnant women. One drink equals 12 ounces of beer, 5 ounces of Miles Leyda, or 1 ounces of hard liquor.  Do not use street drugs.  Do not share needles.  Ask your health care provider for help if you need support or information about quitting drugs.  Tell your health care provider if you often feel depressed.  Tell your health care provider if you have ever been abused or do not feel safe at home. This information is not intended to replace advice given to you by your health care provider. Make sure you discuss any questions you have with your health care provider. Document Released: 02/07/2011 Document Revised: 12/31/2015 Document Reviewed: 04/28/2015 Elsevier Interactive Patient Education  2019 [employer].

## 2018-10-30 ENCOUNTER — Telehealth: Payer: Medicare Other | Admitting: Family

## 2018-10-30 DIAGNOSIS — J019 Acute sinusitis, unspecified: Principal | ICD-10-CM

## 2018-10-30 DIAGNOSIS — B9789 Other viral agents as the cause of diseases classified elsewhere: Secondary | ICD-10-CM | POA: Diagnosis not present

## 2018-10-30 MED ORDER — FLUTICASONE PROPIONATE 50 MCG/ACT NA SUSP: 2.0000 | Freq: Every day | NASAL | 0 refills | Status: DC

## 2018-10-30 NOTE — Progress Notes (Signed)

## 2018-11-21 ENCOUNTER — Other Ambulatory Visit: Payer: Self-pay | Admitting: Family

## 2018-12-05 ENCOUNTER — Encounter: Payer: Medicare Other | Attending: Skilled Nursing Facility1 | Admitting: Skilled Nursing Facility1

## 2018-12-05 ENCOUNTER — Other Ambulatory Visit: Payer: Self-pay

## 2018-12-05 ENCOUNTER — Telehealth: Payer: Self-pay | Admitting: Skilled Nursing Facility1

## 2018-12-05 DIAGNOSIS — E669 Obesity, unspecified: Secondary | ICD-10-CM

## 2018-12-05 NOTE — Telephone Encounter (Signed)
Great question. It is important to stick to proper serving and in a safe place due to the effect's alcohol can have on someone who has had surgery: staying in the blood stream longer and being more potent. Any alcohol beverage is fine within the serving: wine 4-5 ounces, beer 12 ounces, and spirit 1-1.5 ounces. If mixing with juice it will have more sugar. Have a little something on your stomach to help prevent negative effects. Alcohol is stored as fat rather quickly but if you stick to one serving per day you will be okay. There is research on those having had surgery being at higher risk for alcoholism so keep an eye out for that.   I hope this answers your question.   From: [email or web address]>  Sent: Wednesday, December 05, 2018 10:17 AM To: Sandie Ano [email or web address]> Subject: [External Email]Re: Summary of Appointment  *Caution - External email - see footer for warnings* Thank you for our in-depth conversation this morning. I forgot to ask about wine. Type and amount recommended, if recommended. When having a cocktail, I use Ketal One which has 35 calories and 50/50 cranberry juice with lime wedge. Please share your honest thoughts on that.   Sent from my iPhone   On Dec 05, 2018, at 9:45 AM, Scotece, [email or web address]> wrote: ?  <[email or web address]> Alecia Lemming,   -non-starchy vegetables 2 times a day 7 days a week -fat and sugar on the label single digits -Stick to 1 serving of chips 1 time a day -Check the label for your beets for added sugar -Put together balanced meals using your meal ideas sheet -Try soy milk -Try soy crumbles  -Replace your animal products with beans/soy/lentils throughout the week -Take a multi capsule with food on your stomach take at least 2 hours from your calcium  -Take 3 calcium a day all 2 hours a part; tums are okay       Thank You, Sandie Ano MS, RD, [facility], [place] [facility]  [facility]   Office: [phone] Fax: [phone]    NOTICE: This message may contain confidential information intended only for the recipient. If you have received this communication in error, please notify the sender immediately by replying to the message and deleting it from your computer.  <[email or web address]> WARNING: This email originated outside of [facility]. Even if this looks like a [employer], it is not. Do not provide your username, password, or any other personal information in response to this or any other email. [facility] will never ask you for your username or password via email. DO NOT CLICK links or attachments unless you are positive the content is safe. If in doubt about the safety of this message, select the [employer]ishing button, which forwards to IT Security.

## 2018-12-05 NOTE — Progress Notes (Signed)
  Follow-up visit: Post-Operative RYGB Surgery  Medical Nutrition Therapy:  Appt start time: 9:15 end time: 9:45 Telephone appt: pt identified by date of birth and name  Primary concerns today: Post-operative Bariatric Surgery Nutrition Management.   Non-scale victories: being more active, feeling better about herself mentally; getting healthier with cholesterol, blood pressure, and diabetes   Pt states she had mastectomy about 2 years ago and then a hernia repair about 1 year after that. Pt states she worked with the [facility] which did not work well for her. Pt states she was advised to walk 5 minutes a day 5 days a week but has struggled with that due to allergies. Pt states she does not eat pork.  Pt seems motivated.    Surgery date: 12/08/14 Surgery type: RYGB Start weight at [facility]: 262.5 lbs on 04/07/14 Weight today: 211.8 lbs Weight change:  Total weight loss:  Weight loss goals: improve health    24-hr recall: eating non-starchy veggies 5 times a week B (AM): premier protein and 2 slices of [place] bacon and coffee with sugar free [place] creamer Snk (AM): chips L (PM): salad with chicken with [place] dressing with olives onions tomatoes pepperocini or grilled shrimp with cajun seasoning and green beans with salt and pepper Snk (PM): popcorn D (PM): 2-3 oz baked chicken/fish/shrimp with green beans, salad, or beans OR chili with ground [place] (14-21 g)  Snk (PM): chips   Fluid intake: 1 protein shake per day 11 oz, at least 50 oz water, 8 oz decaf coffee with sugar free hazelnut creamer and miralax;   (64 oz+) Estimated total protein intake: ~70 g  Medications: see list Supplementation: calcium   CBG monitoring: 1 x day  Average CBG per patient:  90-100 mg/dl Last patient reported A1c: 5.4  Using straws: No Drinking while eating: Not usually  Hair loss: No Carbonated beverages: No N/V/D/C: constipation getting better and taking benefiber every  day Dumping syndrome: with banana    Recent physical activity:  Water aerobics (as physical therapy) 60 min, 2x/week   Progress Towards Goal(s):  In progress.    Nutritional Diagnosis:  [place]-3.3 Overweight/obesity related to past poor dietary habits and physical inactivity as evidenced by patient w/ recent RYGB surgery following dietary guidelines for continued weight loss.    Intervention:  Nutrition education/diet reinforcement. Pt was educated and counseled on the ways to get back on track with taking appropriate bariatric multivitamins and calcium supplements.See goals. Pt was in agreement with goals listed.  Goals: -non-starchy vegetables 2 times a day 7 days a week -fat and sugar on the label single digits -Stick to 1 serving of chips 1 time a day -Check the label for your beets for added sugar -Put together balanced meals using your meal ideas sheet -Try soy milk -Try soy crumbles  -Replace your animal products with beans/soy/lentils throughout the week -Take a multi capsule with food on your stomach take at least 2 hours from your calcium  -Take 3 calcium a day all 2 hours a part; tums are okay   Teaching Method Utilized:  Visual Auditory Hands on  Barriers to learning/adherence to lifestyle change: none  Demonstrated degree of understanding via:  Teach Back   Monitoring/Evaluation:  Dietary intake, exercise, and body weight. Follow up in 1 month

## 2018-12-10 ENCOUNTER — Encounter: Payer: Self-pay | Admitting: Physician Assistant

## 2018-12-10 ENCOUNTER — Telehealth: Payer: Medicare Other | Admitting: Physician Assistant

## 2018-12-10 DIAGNOSIS — R509 Fever, unspecified: Secondary | ICD-10-CM | POA: Diagnosis not present

## 2018-12-10 DIAGNOSIS — R059 Cough, unspecified: Secondary | ICD-10-CM

## 2018-12-10 DIAGNOSIS — R05 Cough: Secondary | ICD-10-CM | POA: Diagnosis not present

## 2018-12-10 MED ORDER — BENZONATATE 100 MG PO CAPS: 100.0000 mg | ORAL_CAPSULE | Freq: Two times a day (BID) | ORAL | 0 refills | Status: DC | PRN

## 2018-12-10 NOTE — Progress Notes (Signed)
E-Visit for Corona Virus Screening  Based on your current symptoms, you may very well have the virus, however your symptoms are mild. Currently, not all patients are being tested. If the symptoms are mild and there is not a known exposure, performing the test is not indicated.   Elizabeth Hawkins,  Due to the Covid outbreak, your symptoms suggest that you may have it. I have prescribed cough medicine to help with cough. If you develop a fever again, take Tylenol. If your symptoms persist, worse, or if you develop any new symptoms submit an Evisit or see your doctor.     You have been enrolled in [place] for COVID-19. Daily you will receive a questionnaire within the [facility] website. Our COVID-19 response team will be monitoring your responses daily.   Coronavirus disease 2019 (COVID-19) is a respiratory illness that can spread from person to person. The virus that causes COVID-19 is a new virus that was first identified in the country of [place] but is now found in multiple other countries and has spread to the [place].  Symptoms associated with the virus are mild to severe fever, cough, and shortness of breath. There is currently no vaccine to protect against COVID-19, and there is no specific antiviral treatment for the virus.   To be considered HIGH RISK for Coronavirus (COVID-19), you have to meet the following criteria:  . Traveled to [place], [place], [place], [place] or [place]; or in the [place] to [place], [place], [place], or [place]; and have fever, cough, and shortness of breath within the last 2 weeks of travel OR  . Been in close contact with a person diagnosed with COVID-19 within the last 2 weeks and have fever, cough, and shortness of breath  . IF YOU DO NOT MEET THESE CRITERIA, YOU ARE CONSIDERED LOW RISK FOR COVID-19.   It is vitally important that if you feel that you have an infection such as this virus or any other virus that you stay home and away  from places where you may spread it to others.  You should self-quarantine for 14 days if you have symptoms that could potentially be coronavirus and avoid contact with people age 40 and older.   You can use medication such as A prescription cough medication called Tessalon Perles 100 mg. You may take 1-2 capsules every 8 hours as needed for cough  You may also take acetaminophen (Tylenol) as needed for fever.   Reduce your risk of any infection by using the same precautions used for avoiding the common cold or flu:  Marland Kitchen Wash your hands often with soap and warm water for at least 20 seconds.  If soap and water are not readily available, use an alcohol-based hand sanitizer with at least 60% alcohol.  . If coughing or sneezing, cover your mouth and nose by coughing or sneezing into the elbow areas of your shirt or coat, into a tissue or into your sleeve (not your hands). . Avoid shaking hands with others and consider head nods or verbal greetings only. . Avoid touching your eyes, nose, or mouth with unwashed hands.  . Avoid close contact with people who are sick. . Avoid places or events with large numbers of people in one location, like concerts or sporting events. . Carefully consider travel plans you have or are making. . If you are planning any travel outside or inside the [place], visit the CDC's Travelers' Health webpage for the latest health notices. Marland Kitchen  If you have some symptoms but not all symptoms, continue to monitor at home and seek medical attention if your symptoms worsen. . If you are having a medical emergency, call 911.  HOME CARE . Only take medications as instructed by your medical team. . Drink plenty of fluids and get plenty of rest. . A steam or ultrasonic humidifier can help if you have congestion.   GET HELP RIGHT AWAY IF: . You develop worsening fever. . You become short of breath . You cough up blood. . Your symptoms become more severe MAKE SURE YOU   Understand these  instructions.  Will watch your condition.  Will get help right away if you are not doing well or get worse.  Your e-visit answers were reviewed by a board certified advanced clinical practitioner to complete your personal care plan.  Depending on the condition, your plan could have included both over the counter or prescription medications.  If there is a problem please reply once you have received a response from your provider. Your safety is important to [place].  If you have drug allergies check your prescription carefully.    You can use MyChart to ask questions about today's visit, request a non-urgent call back, or ask for a work or school excuse for 24 hours related to this e-Visit. If it has been greater than 24 hours you will need to follow up with your provider, or enter a new e-Visit to address those concerns. You will get an e-mail in the next two days asking about your experience.  I hope that your e-visit has been valuable and will speed your recovery. Thank you for using e-visits.   I have spent 7 min in completion and review of this note- [facility]

## 2018-12-14 MED ORDER — ALBUTEROL SULFATE HFA 108 (90 BASE) MCG/ACT IN AERS: 2.0000 | INHALATION_SPRAY | Freq: Four times a day (QID) | RESPIRATORY_TRACT | 2 refills | Status: DC | PRN

## 2018-12-14 MED ORDER — BENZONATATE 100 MG PO CAPS: 100.0000 mg | ORAL_CAPSULE | Freq: Two times a day (BID) | ORAL | 0 refills | Status: DC | PRN

## 2018-12-14 NOTE — Addendum Note (Signed)
Addended by: Benjamine Mola on: 12/14/2018 02:25 PM   Modules accepted: Orders

## 2018-12-21 ENCOUNTER — Ambulatory Visit (HOSPITAL_BASED_OUTPATIENT_CLINIC_OR_DEPARTMENT_OTHER): Admit: 2018-12-21 | Payer: Medicare Other | Admitting: General Surgery

## 2018-12-21 ENCOUNTER — Encounter (HOSPITAL_BASED_OUTPATIENT_CLINIC_OR_DEPARTMENT_OTHER): Payer: Self-pay

## 2018-12-21 SURGERY — SIMPLE MASTECTOMY
Anesthesia: General | Site: Breast | Laterality: Bilateral

## 2019-01-03 ENCOUNTER — Ambulatory Visit: Payer: Medicare Other | Admitting: Skilled Nursing Facility1

## 2019-01-08 ENCOUNTER — Other Ambulatory Visit: Payer: Self-pay

## 2019-01-08 ENCOUNTER — Encounter: Payer: Medicare Other | Attending: General Surgery | Admitting: Skilled Nursing Facility1

## 2019-01-08 DIAGNOSIS — E119 Type 2 diabetes mellitus without complications: Secondary | ICD-10-CM | POA: Insufficient documentation

## 2019-01-08 DIAGNOSIS — Z6839 Body mass index (BMI) 39.0-39.9, adult: Secondary | ICD-10-CM | POA: Diagnosis not present

## 2019-01-08 DIAGNOSIS — E669 Obesity, unspecified: Secondary | ICD-10-CM | POA: Diagnosis not present

## 2019-01-08 NOTE — Progress Notes (Signed)
Follow-up visit: Post-Operative RYGB Surgery  Medical Nutrition Therapy:  Appt start time: 9:15 end time: 9:45 Telephone appt: pt identified by date of birth and name  Primary concerns today: Post-operative Bariatric Surgery Nutrition Management.   Non-scale victories: being more active, feeling better about herself mentally; getting healthier with cholesterol, blood pressure, and diabetes    Pt states sshe has been doing [employer] 3 days a week. Pt states she tries to do arm circles while watching television. Pt states the guidelines given in the last appt were helpful, she finds meal planning helpful. Pt states next month she is having corrective surgery from her breast cancer surgery. Pt states candy causes diarrhea. Pt states she has been sticking to the recommendations with alcohol and and not adding juice. Pt states she has increased her non starchy veggies throughout the week trying to make a conscious decision to do that.    Surgery date: 12/08/14 Surgery type: RYGB Start weight at [facility]: 262.5 lbs on 04/07/14 Weight today:  227.8 Weight loss goals: improve health    24-hr recall: eating non-starchy veggies 5 times a week B (AM): premier protein and yogurt Snk (AM): chips L (PM): lean cuisine frozen meals Snk (PM): popcorn or sugar free pudding or fruit cocktail or suagr free jello D (PM): pork chop and zucchini with onion Snk (PM): chips and a glass of wine   Fluid intake: 1 protein shake per day 11 oz, at least 50 oz water, 8 oz decaf coffee with sugar free hazelnut creamer and miralax;   (72 oz+); wine Estimated total protein intake: ~70 g  Body Composition Scale 01/08/2019  Total Body Fat % 45  Visceral Fat 17  Fat-Free Mass % 54.9   Total Body Water % 41.9   Muscle-Mass lbs 29.3  Body Fat Displacement          Torso  lbs 63.5         Left Leg  lbs 12.7         Right Leg  lbs 12.7         Left Arm  lbs 6.3         Right Arm   lbs 6.3    Medications: see  list Supplementation: calcium and celebrate 3 times a day  CBG monitoring:  Average CBG per patient:   Last patient reported A1c:  Using straws: No Drinking while eating: Not usually  Hair loss: No Carbonated beverages: No N/V/D/C: constipation getting better and taking benefiber every day; using miralax in coffee every morning  Dumping syndrome: with banana    Recent physical activity:  Online [employer] 3 times a week  Progress Towards Goal(s):  In progress.    Nutritional Diagnosis:  Browning-3.3 Overweight/obesity related to past poor dietary habits and physical inactivity as evidenced by patient w/ recent RYGB surgery following dietary guidelines for continued weight loss.    Intervention:  Nutrition education/diet reinforcement. Pt was educated and counseled on the ways to get back on track with taking appropriate bariatric multivitamins and calcium supplements.See goals. Pt was in agreement with goals listed.  Goals: -Try sweetened vanilla soy milk  -Try soy crumbles  -In smoothies: protein such as peanut butter powder or greek yogurt or soy milk with 1 fruit and 1/2 cup to 1 cup non starchy vegetable   -Keep up with the veggies!!  -Try plain no fat greek yogurt instead of sour cream   -Consistency is extremely important   Teaching Method Utilized:  Visual Auditory Hands on  Barriers to learning/adherence to lifestyle change: none  Demonstrated degree of understanding via:  Teach Back   Monitoring/Evaluation:  Dietary intake, exercise, and body weight. Follow up in 6 weeks

## 2019-01-08 NOTE — Patient Instructions (Addendum)
-  Try sweetened vanilla soy milk  -Try soy crumbles  -In smoothies: protein such as peanut butter powder or greek yogurt or soy milk with 1 fruit and 1/2 cup to 1 cup non starchy vegetable   -Keep up with the veggies!!  -Try plain no fat greek yogurt instead of sour cream   -Consistency is extremely important

## 2019-01-24 ENCOUNTER — Ambulatory Visit: Payer: Self-pay | Admitting: Internal Medicine

## 2019-02-20 ENCOUNTER — Ambulatory Visit: Payer: Medicare Other | Admitting: Skilled Nursing Facility1

## 2019-02-21 ENCOUNTER — Other Ambulatory Visit: Payer: Self-pay

## 2019-02-21 ENCOUNTER — Ambulatory Visit (INDEPENDENT_AMBULATORY_CARE_PROVIDER_SITE_OTHER): Payer: Medicare Other

## 2019-02-21 ENCOUNTER — Ambulatory Visit (INDEPENDENT_AMBULATORY_CARE_PROVIDER_SITE_OTHER): Payer: Medicare Other | Admitting: Podiatry

## 2019-02-21 ENCOUNTER — Encounter: Payer: Self-pay | Admitting: Podiatry

## 2019-02-21 VITALS — BP 155/80 | HR 66 | Temp 97.5°F

## 2019-02-21 DIAGNOSIS — M7751 Other enthesopathy of right foot: Secondary | ICD-10-CM

## 2019-02-21 DIAGNOSIS — M7750 Other enthesopathy of unspecified foot: Secondary | ICD-10-CM

## 2019-02-21 DIAGNOSIS — B353 Tinea pedis: Secondary | ICD-10-CM

## 2019-02-21 MED ORDER — KETOCONAZOLE 2 % EX CREA: 1.0000 "application " | TOPICAL_CREAM | Freq: Every day | CUTANEOUS | 2 refills | Status: DC

## 2019-02-21 MED ORDER — METHYLPREDNISOLONE 4 MG PO TBPK: ORAL_TABLET | ORAL | 0 refills | Status: DC

## 2019-02-21 NOTE — Patient Instructions (Signed)
Look at getting Voltaren gel (diclofenac gel).   Achilles Tendinitis  with Rehab Achilles tendinitis is a disorder of the Achilles tendon. The Achilles tendon connects the large calf muscles (Gastrocnemius and Soleus) to the heel bone (calcaneus). This tendon is sometimes called the heel cord. It is important for pushing-off and standing on your toes and is important for walking, running, or jumping. Tendinitis is often caused by overuse and repetitive microtrauma. SYMPTOMS  Pain, tenderness, swelling, warmth, and redness may occur over the Achilles tendon even at rest.  Pain with pushing off, or flexing or extending the ankle.  Pain that is worsened after or during activity. CAUSES   Overuse sometimes seen with rapid increase in exercise programs or in sports requiring running and jumping.  Poor physical conditioning (strength and flexibility or endurance).  Running sports, especially training running down hills.  Inadequate warm-up before practice or play or failure to stretch before participation.  Injury to the tendon. PREVENTION   Warm up and stretch before practice or competition.  Allow time for adequate rest and recovery between practices and competition.  Keep up conditioning.  Keep up ankle and leg flexibility.  Improve or keep muscle strength and endurance.  Improve cardiovascular fitness.  Use proper technique.  Use proper equipment (shoes, skates).  To help prevent recurrence, taping, protective strapping, or an adhesive bandage may be recommended for several weeks after healing is complete. PROGNOSIS   Recovery may take weeks to several months to heal.  Longer recovery is expected if symptoms have been prolonged.  Recovery is usually quicker if the inflammation is due to a direct blow as compared with overuse or sudden strain. RELATED COMPLICATIONS   Healing time will be prolonged if the condition is not correctly treated. The injury must be given  plenty of time to heal.  Symptoms can reoccur if activity is resumed too soon.  Untreated, tendinitis may increase the risk of tendon rupture requiring additional time for recovery and possibly surgery. TREATMENT   The first treatment consists of rest anti-inflammatory medication, and ice to relieve the pain.  Stretching and strengthening exercises after resolution of pain will likely help reduce the risk of recurrence. Referral to a physical therapist or athletic trainer for further evaluation and treatment may be helpful.  A walking boot or cast may be recommended to rest the Achilles tendon. This can help break the cycle of inflammation and microtrauma.  Arch supports (orthotics) may be prescribed or recommended by your caregiver as an adjunct to therapy and rest.  Surgery to remove the inflamed tendon lining or degenerated tendon tissue is rarely necessary and has shown less than predictable results. MEDICATION   Nonsteroidal anti-inflammatory medications, such as aspirin and ibuprofen, may be used for pain and inflammation relief. Do not take within 7 days before surgery. Take these as directed by your caregiver. Contact your caregiver immediately if any bleeding, stomach upset, or signs of allergic reaction occur. Other minor pain relievers, such as acetaminophen, may also be used.  Pain relievers may be prescribed as necessary by your caregiver. Do not take prescription pain medication for longer than 4 to 7 days. Use only as directed and only as much as you need.  Cortisone injections are rarely indicated. Cortisone injections may weaken tendons and predispose to rupture. It is better to give the condition more time to heal than to use them. HEAT AND COLD  Cold is used to relieve pain and reduce inflammation for acute and chronic Achilles tendinitis.  Cold should be applied for 10 to 15 minutes every 2 to 3 hours for inflammation and pain and immediately after any activity that  aggravates your symptoms. Use ice packs or an ice massage.  Heat may be used before performing stretching and strengthening activities prescribed by your caregiver. Use a heat pack or a warm soak. SEEK MEDICAL CARE IF:  Symptoms get worse or do not improve in 2 weeks despite treatment.  New, unexplained symptoms develop. Drugs used in treatment may produce side effects.  EXERCISES:  RANGE OF MOTION (ROM) AND STRETCHING EXERCISES - Achilles Tendinitis  These exercises may help you when beginning to rehabilitate your injury. Your symptoms may resolve with or without further involvement from your physician, physical therapist or athletic trainer. While completing these exercises, remember:   Restoring tissue flexibility helps normal motion to return to the joints. This allows healthier, less painful movement and activity.  An effective stretch should be held for at least 30 seconds.  A stretch should never be painful. You should only feel a gentle lengthening or release in the stretched tissue.  STRETCH  Gastroc, Standing   Place hands on wall.  Extend right / left leg, keeping the front knee somewhat bent.  Slightly point your toes inward on your back foot.  Keeping your right / left heel on the floor and your knee straight, shift your weight toward the wall, not allowing your back to arch.  You should feel a gentle stretch in the right / left calf. Hold this position for 10 seconds. Repeat 3 times. Complete this stretch 2 times per day.  STRETCH  Soleus, Standing   Place hands on wall.  Extend right / left leg, keeping the other knee somewhat bent.  Slightly point your toes inward on your back foot.  Keep your right / left heel on the floor, bend your back knee, and slightly shift your weight over the back leg so that you feel a gentle stretch deep in your back calf.  Hold this position for 10 seconds. Repeat 3 times. Complete this stretch 2 times per day.  STRETCH   Gastrocsoleus, Standing  Note: This exercise can place a lot of stress on your foot and ankle. Please complete this exercise only if specifically instructed by your caregiver.   Place the ball of your right / left foot on a step, keeping your other foot firmly on the same step.  Hold on to the wall or a rail for balance.  Slowly lift your other foot, allowing your body weight to press your heel down over the edge of the step.  You should feel a stretch in your right / left calf.  Hold this position for 10 seconds.  Repeat this exercise with a slight bend in your knee. Repeat 3 times. Complete this stretch 2 times per day.   STRENGTHENING EXERCISES - Achilles Tendinitis These exercises may help you when beginning to rehabilitate your injury. They may resolve your symptoms with or without further involvement from your physician, physical therapist or athletic trainer. While completing these exercises, remember:   Muscles can gain both the endurance and the strength needed for everyday activities through controlled exercises.  Complete these exercises as instructed by your physician, physical therapist or athletic trainer. Progress the resistance and repetitions only as guided.  You may experience muscle soreness or fatigue, but the pain or discomfort you are trying to eliminate should never worsen during these exercises. If this pain does worsen, stop and  make certain you are following the directions exactly. If the pain is still present after adjustments, discontinue the exercise until you can discuss the trouble with your clinician.  STRENGTH - Plantar-flexors   Sit with your right / left leg extended. Holding onto both ends of a rubber exercise band/tubing, loop it around the ball of your foot. Keep a slight tension in the band.  Slowly push your toes away from you, pointing them downward.  Hold this position for 10 seconds. Return slowly, controlling the tension in the band/tubing.  Repeat 3 times. Complete this exercise 2 times per day.   STRENGTH - Plantar-flexors   Stand with your feet shoulder width apart. Steady yourself with a wall or table using as little support as needed.  Keeping your weight evenly spread over the width of your feet, rise up on your toes.*  Hold this position for 10 seconds. Repeat 3 times. Complete this exercise 2 times per day.  *If this is too easy, shift your weight toward your right / left leg until you feel challenged. Ultimately, you may be asked to do this exercise with your right / left foot only.  STRENGTH  Plantar-flexors, Eccentric  Note: This exercise can place a lot of stress on your foot and ankle. Please complete this exercise only if specifically instructed by your caregiver.   Place the balls of your feet on a step. With your hands, use only enough support from a wall or rail to keep your balance.  Keep your knees straight and rise up on your toes.  Slowly shift your weight entirely to your right / left toes and pick up your opposite foot. Gently and with controlled movement, lower your weight through your right / left foot so that your heel drops below the level of the step. You will feel a slight stretch in the back of your calf at the end position.  Use the healthy leg to help rise up onto the balls of both feet, then lower weight only on the right / left leg again. Build up to 15 repetitions. Then progress to 3 consecutive sets of 15 repetitions.*  After completing the above exercise, complete the same exercise with a slight knee bend (about 30 degrees). Again, build up to 15 repetitions. Then progress to 3 consecutive sets of 15 repetitions.* Perform this exercise 2 times per day.  *When you easily complete 3 sets of 15, your physician, physical therapist or athletic trainer may advise you to add resistance by wearing a backpack filled with additional weight.  STRENGTH - Plantar Flexors, Seated   Sit on a chair that  allows your feet to rest flat on the ground. If necessary, sit at the edge of the chair.  Keeping your toes firmly on the ground, lift your right / left heel as far as you can without increasing any discomfort in your ankle. Repeat 3 times. Complete this exercise 2 times a day.  Ice Massage and Cold Packs Home Program  Ice and cold packs are used so that we can return the muscle to it's natural resting state without causing more pain, which can lead to more spasm, etc.  Icing and cold packs are also used to reduce swelling, which can lead to pain and stiffness.  COLD PACKS Cold packs should be placed circumferentially around the swollen area (ie., the wrist, etc.).  A paper towel can be placed over the area before the cold pack is applied.  A towel may be wrapped  around the outside of the cold pack to keep the cold in.  The swollen area should be elevated with the cold pack, if possible.  ICE MASSAGE Fill a 4 ounce paper cup three-quarters full and put it in a freezer until it is frozen.  When ready to use, tear off about 1 inch of the cup so that some of the ice is showing while the bottom of the cup can be used to hold onto. Massage the entire muscle area as instructed by your therapist.  You may use circular or up and down strokes, but do not hold the ice in one spot.  Four phases to the ice massage and cold pack application: 1. Cold: which you feel when you first apply the ice. 2. Ache: after a few minutes 3. Burning: after approximately five minutes, it will feel like your skin is burning.  At this point, remove the ice for a minute or so. 4. Numbness: THIS IS THE CRUCIAL PHASE!!! Return the ice or cold pack and massage until all the burning disappears.  This signals the end of cryotherapy.  The entire procedure should take ten to fifteen minutes while using the cold pack.  Do Not perform the ice massage for more than seven minutes on a small area or more than ten minutes on a large area.   Cryotherapy should be performed after exercises, when edema occurs, or when an area is painful.

## 2019-02-23 DIAGNOSIS — R0902 Hypoxemia: Secondary | ICD-10-CM | POA: Diagnosis not present

## 2019-02-23 DIAGNOSIS — S0990XA Unspecified injury of head, initial encounter: Secondary | ICD-10-CM | POA: Diagnosis not present

## 2019-02-23 DIAGNOSIS — S82391A Other fracture of lower end of right tibia, initial encounter for closed fracture: Secondary | ICD-10-CM | POA: Diagnosis not present

## 2019-02-23 DIAGNOSIS — W1839XA Other fall on same level, initial encounter: Secondary | ICD-10-CM | POA: Diagnosis not present

## 2019-02-23 DIAGNOSIS — Z79899 Other long term (current) drug therapy: Secondary | ICD-10-CM | POA: Diagnosis not present

## 2019-02-23 DIAGNOSIS — I10 Essential (primary) hypertension: Secondary | ICD-10-CM | POA: Diagnosis not present

## 2019-02-23 DIAGNOSIS — R52 Pain, unspecified: Secondary | ICD-10-CM | POA: Diagnosis not present

## 2019-02-23 DIAGNOSIS — R51 Headache: Secondary | ICD-10-CM | POA: Diagnosis not present

## 2019-02-23 DIAGNOSIS — S82891A Other fracture of right lower leg, initial encounter for closed fracture: Secondary | ICD-10-CM | POA: Diagnosis not present

## 2019-02-23 DIAGNOSIS — S0181XA Laceration without foreign body of other part of head, initial encounter: Secondary | ICD-10-CM | POA: Diagnosis not present

## 2019-02-23 DIAGNOSIS — S82301A Unspecified fracture of lower end of right tibia, initial encounter for closed fracture: Secondary | ICD-10-CM | POA: Diagnosis not present

## 2019-02-23 DIAGNOSIS — S0101XA Laceration without foreign body of scalp, initial encounter: Secondary | ICD-10-CM | POA: Diagnosis not present

## 2019-02-23 DIAGNOSIS — R58 Hemorrhage, not elsewhere classified: Secondary | ICD-10-CM | POA: Diagnosis not present

## 2019-02-23 DIAGNOSIS — Z209 Contact with and (suspected) exposure to unspecified communicable disease: Secondary | ICD-10-CM | POA: Diagnosis not present

## 2019-02-25 ENCOUNTER — Other Ambulatory Visit: Payer: Self-pay

## 2019-02-25 ENCOUNTER — Ambulatory Visit (INDEPENDENT_AMBULATORY_CARE_PROVIDER_SITE_OTHER): Payer: Medicare Other | Admitting: Internal Medicine

## 2019-02-25 ENCOUNTER — Encounter: Payer: Self-pay | Admitting: Internal Medicine

## 2019-02-25 ENCOUNTER — Encounter: Payer: Self-pay | Admitting: Physician Assistant

## 2019-02-25 ENCOUNTER — Ambulatory Visit (INDEPENDENT_AMBULATORY_CARE_PROVIDER_SITE_OTHER): Payer: Medicare Other | Admitting: Physician Assistant

## 2019-02-25 ENCOUNTER — Ambulatory Visit (INDEPENDENT_AMBULATORY_CARE_PROVIDER_SITE_OTHER): Payer: Medicare Other

## 2019-02-25 ENCOUNTER — Ambulatory Visit: Payer: Self-pay | Admitting: Internal Medicine

## 2019-02-25 DIAGNOSIS — R110 Nausea: Secondary | ICD-10-CM

## 2019-02-25 DIAGNOSIS — M25571 Pain in right ankle and joints of right foot: Secondary | ICD-10-CM

## 2019-02-25 DIAGNOSIS — S0083XA Contusion of other part of head, initial encounter: Secondary | ICD-10-CM | POA: Insufficient documentation

## 2019-02-25 DIAGNOSIS — S82891D Other fracture of right lower leg, subsequent encounter for closed fracture with routine healing: Secondary | ICD-10-CM

## 2019-02-25 DIAGNOSIS — S0181XD Laceration without foreign body of other part of head, subsequent encounter: Secondary | ICD-10-CM | POA: Diagnosis not present

## 2019-02-25 DIAGNOSIS — S0181XA Laceration without foreign body of other part of head, initial encounter: Secondary | ICD-10-CM | POA: Insufficient documentation

## 2019-02-25 DIAGNOSIS — M542 Cervicalgia: Secondary | ICD-10-CM

## 2019-02-25 DIAGNOSIS — S82899A Other fracture of unspecified lower leg, initial encounter for closed fracture: Secondary | ICD-10-CM | POA: Insufficient documentation

## 2019-02-25 MED ORDER — OXYCODONE HCL 10 MG PO TABS: 10.0000 mg | ORAL_TABLET | Freq: Four times a day (QID) | ORAL | 0 refills | Status: DC | PRN

## 2019-02-25 MED ORDER — TIZANIDINE HCL 2 MG PO CAPS: 2.0000 mg | ORAL_CAPSULE | Freq: Three times a day (TID) | ORAL | 1 refills | Status: DC

## 2019-02-25 MED ORDER — ONDANSETRON HCL 4 MG PO TABS: 4.0000 mg | ORAL_TABLET | Freq: Three times a day (TID) | ORAL | 0 refills | Status: DC | PRN

## 2019-02-25 NOTE — Telephone Encounter (Signed)
Noted  
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
## 2019-02-25 NOTE — Telephone Encounter (Signed)
  Pt called in post ED visit on Saturday night to follow up with PCP, Dr. Jenny Reichmann per their instruction after she fell Saturday night down some steps and onto concrete losing consciousness.  She has stitches in the front of her head and a broken right ankle.    See triage notes below  I warm transferred the call into Dr. Gwynn Burly office to [facility] to be scheduled.  I sent my notes to office.   Reason for Disposition . Sounds like a serious injury to the triager    Seen the Ed Saturday night at [facility] in [place].  Answer Assessment - Initial Assessment Questions 1. MECHANISM: "How did the injury happen?" For falls, ask: "What height did you fall from?" and "What surface did you fall against?"      I fell Saturday.  The back of my head hurts.   I have a gash in the front of my head.   I broke my right ankle.   I went to the ED in [place].   I have stitches in my head.    My eye was swollen and bruised my right eye.    I've been icing it.  I fell over my night gown going down the steps and I fell down the steps and onto the concrete.  I lost consciousness.  2. ONSET: "When did the injury happen?" (Minutes or hours ago)      Saturday night 3. NEUROLOGIC SYMPTOMS: "Was there any loss of consciousness?" "Are there any other neurological symptoms?"      Yes 4. MENTAL STATUS: "Does the person know who he is, who you are, and where he is?"      I'm fine this morning except a headache especially when I touch the back of my head.   5. LOCATION: "What part of the head was hit?"      The back and I have a gash in the front of my head. 6. SCALP APPEARANCE: "What does the scalp look like? Is it bleeding now?" If so, ask: "Is it difficult to stop?"      Stitches 7. SIZE: For cuts, bruises, or swelling, ask: "How large is it?" (e.g., inches or centimeters)      Back of head is very sore. 8. PAIN: "Is there any pain?" If so, ask: "How bad is it?"  (e.g., Scale 1-10; or mild, moderate, severe)     Yes 9. TETANUS: For  any breaks in the skin, ask: "When was the last tetanus booster?"     Not asked 10. OTHER SYMPTOMS: "Do you have any other symptoms?" (e.g., neck pain, vomiting)       No 11. PREGNANCY: "Is there any chance you are pregnant?" "When was your last menstrual period?"       Not asked  Protocols used: HEAD INJURY-A-AH

## 2019-02-25 NOTE — Patient Instructions (Signed)
Please return in 7 days for the stitches out  You will be contacted regarding the referral for: orthopedic  Please take all new medication as prescribed - the oxycodone 10 mg, muscle relaxer and nausea medication if needed  Please continue all other medications as before, and refills have been done if requested.  Please have the pharmacy call with any other refills you may need.  Please keep your appointments with your specialists as you may have planned

## 2019-02-25 NOTE — Telephone Encounter (Signed)
Appointment has been made for today 7/20.

## 2019-02-26 ENCOUNTER — Ambulatory Visit: Payer: Medicare Other | Admitting: Internal Medicine
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
## 2019-03-03 ENCOUNTER — Encounter: Payer: Self-pay | Admitting: Internal Medicine

## 2019-03-03 NOTE — Assessment & Plan Note (Addendum)
[place] for oxycodone refill, refer ortho as planned  Note:  Total time for pt hx, exam, review of record with pt in the room, determination of diagnoses and plan for further eval and tx is > 40 min, with over 50% spent in coordination and counseling of patient including the differential dx, tx, further evaluation and other management of right ankle fracture, nausea after closed head injury with forehead laceration, and post neck pain
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
## 2019-03-03 NOTE — Assessment & Plan Note (Signed)
No sign of infection and wound intact, for f/u 7 days for stitches out

## 2019-03-11 ENCOUNTER — Ambulatory Visit (INDEPENDENT_AMBULATORY_CARE_PROVIDER_SITE_OTHER): Payer: Medicare Other

## 2019-03-11 ENCOUNTER — Other Ambulatory Visit: Payer: Self-pay

## 2019-03-11 ENCOUNTER — Ambulatory Visit (INDEPENDENT_AMBULATORY_CARE_PROVIDER_SITE_OTHER): Payer: Medicare Other | Admitting: Physician Assistant

## 2019-03-11 ENCOUNTER — Encounter: Payer: Self-pay | Admitting: Physician Assistant

## 2019-03-11 DIAGNOSIS — M25571 Pain in right ankle and joints of right foot: Secondary | ICD-10-CM

## 2019-03-11 NOTE — Progress Notes (Signed)
HPI: Elizabeth Hawkins returns 2 weeks status post right ankle injury.  She sustained a nondisplaced medial malleolus fracture.  She is been in a cam walker boot nonweightbearing.  She said no fevers chills shortness breath chest pain.  Review of systems: See HPI otherwise negative  Physical exam: Right ankle tenderness over the medial malleolus.  No tenderness over the lateral malleolus or calcaneus.  Right calf supple nontender.  Sensation intact throughout the foot dorsal pedal pulse present.  Radiographs: 3 views right ankle: Talus well located within the ankle mortise no diastases.  Some early consolidation within the medial malleolus fracture that involves the plafond.  No other fractures identified.  Impression: Right ankle nondisplaced lateral malleolus fracture  Plan: She will undergo partial weightbearing in the cam walker boot on the right side for the next 2 weeks and then weightbearing as tolerated.  We will see her back in 1 month at that time we will obtain an 3 views of the right ankle most likely place her in an ASO brace.  Questions encouraged and answered at length.

## 2019-03-20 ENCOUNTER — Encounter: Payer: Self-pay | Admitting: Internal Medicine

## 2019-03-20 ENCOUNTER — Ambulatory Visit (INDEPENDENT_AMBULATORY_CARE_PROVIDER_SITE_OTHER): Payer: Medicare Other | Admitting: Internal Medicine

## 2019-03-20 DIAGNOSIS — J45909 Unspecified asthma, uncomplicated: Secondary | ICD-10-CM | POA: Diagnosis not present

## 2019-03-20 DIAGNOSIS — F411 Generalized anxiety disorder: Secondary | ICD-10-CM | POA: Diagnosis not present

## 2019-03-20 DIAGNOSIS — M542 Cervicalgia: Secondary | ICD-10-CM

## 2019-03-20 MED ORDER — TRAMADOL HCL 50 MG PO TABS: 50.0000 mg | ORAL_TABLET | Freq: Four times a day (QID) | ORAL | 0 refills | Status: DC | PRN

## 2019-03-20 NOTE — Assessment & Plan Note (Signed)
stable overall by history and exam, recent data reviewed with pt, and pt to continue medical treatment as before,  to f/u any worsening symptoms or concerns  
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
## 2019-03-20 NOTE — Patient Instructions (Signed)
Please take all new medication as prescribed - the tramadol as needed  Please continue all other medications as before except for the oxycodone and tizanidine  Please have the pharmacy call with any other refills you may need.  Please keep your appointments with your specialists as you may have planned

## 2019-03-28 ENCOUNTER — Ambulatory Visit: Payer: Medicare Other | Admitting: Podiatry

## 2019-04-08 ENCOUNTER — Encounter: Payer: Self-pay | Admitting: Podiatry

## 2019-04-08 ENCOUNTER — Encounter: Payer: Self-pay | Admitting: Physician Assistant

## 2019-04-08 ENCOUNTER — Ambulatory Visit (INDEPENDENT_AMBULATORY_CARE_PROVIDER_SITE_OTHER): Payer: Medicare Other | Admitting: Physician Assistant

## 2019-04-08 ENCOUNTER — Ambulatory Visit (INDEPENDENT_AMBULATORY_CARE_PROVIDER_SITE_OTHER): Payer: Medicare Other

## 2019-04-08 DIAGNOSIS — M25571 Pain in right ankle and joints of right foot: Secondary | ICD-10-CM | POA: Diagnosis not present

## 2019-04-08 MED ORDER — TERBINAFINE HCL 250 MG PO TABS: 250.0000 mg | ORAL_TABLET | Freq: Every day | ORAL | 0 refills | Status: DC
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
## 2019-04-10 DIAGNOSIS — D0511 Intraductal carcinoma in situ of right breast: Secondary | ICD-10-CM | POA: Diagnosis not present

## 2019-04-10 DIAGNOSIS — L987 Excessive and redundant skin and subcutaneous tissue: Secondary | ICD-10-CM | POA: Diagnosis not present

## 2019-04-10 DIAGNOSIS — Z9884 Bariatric surgery status: Secondary | ICD-10-CM | POA: Diagnosis not present

## 2019-04-15 ENCOUNTER — Encounter: Payer: Self-pay | Admitting: Podiatry

## 2019-04-30 ENCOUNTER — Other Ambulatory Visit (HOSPITAL_COMMUNITY): Payer: Medicare Other

## 2019-05-03 ENCOUNTER — Encounter (HOSPITAL_BASED_OUTPATIENT_CLINIC_OR_DEPARTMENT_OTHER): Admission: RE | Payer: Self-pay | Source: Home / Self Care

## 2019-05-03 ENCOUNTER — Ambulatory Visit (HOSPITAL_BASED_OUTPATIENT_CLINIC_OR_DEPARTMENT_OTHER): Admission: RE | Admit: 2019-05-03 | Payer: 59 | Source: Home / Self Care | Admitting: Surgery

## 2019-05-03 SURGERY — SIMPLE MASTECTOMY
Anesthesia: General | Site: Breast | Laterality: Bilateral

## 2019-05-16 ENCOUNTER — Encounter: Payer: Self-pay | Admitting: Internal Medicine

## 2019-05-22 ENCOUNTER — Other Ambulatory Visit: Payer: Self-pay | Admitting: Internal Medicine

## 2019-05-22 ENCOUNTER — Encounter: Payer: Self-pay | Admitting: Internal Medicine

## 2019-05-22 ENCOUNTER — Ambulatory Visit (INDEPENDENT_AMBULATORY_CARE_PROVIDER_SITE_OTHER): Payer: Medicare Other | Admitting: Internal Medicine

## 2019-05-22 ENCOUNTER — Other Ambulatory Visit (INDEPENDENT_AMBULATORY_CARE_PROVIDER_SITE_OTHER): Payer: Medicare Other

## 2019-05-22 ENCOUNTER — Other Ambulatory Visit: Payer: Self-pay

## 2019-05-22 VITALS — BP 126/84 | HR 74 | Temp 98.7°F | Ht 64.0 in | Wt 214.0 lb

## 2019-05-22 DIAGNOSIS — F411 Generalized anxiety disorder: Secondary | ICD-10-CM

## 2019-05-22 DIAGNOSIS — M545 Low back pain: Secondary | ICD-10-CM | POA: Diagnosis not present

## 2019-05-22 DIAGNOSIS — E119 Type 2 diabetes mellitus without complications: Secondary | ICD-10-CM | POA: Diagnosis not present

## 2019-05-22 DIAGNOSIS — R519 Headache, unspecified: Secondary | ICD-10-CM | POA: Diagnosis not present

## 2019-05-22 DIAGNOSIS — E559 Vitamin D deficiency, unspecified: Secondary | ICD-10-CM | POA: Diagnosis not present

## 2019-05-22 DIAGNOSIS — Z114 Encounter for screening for human immunodeficiency virus [HIV]: Secondary | ICD-10-CM

## 2019-05-22 DIAGNOSIS — G8929 Other chronic pain: Secondary | ICD-10-CM

## 2019-05-22 DIAGNOSIS — E538 Deficiency of other specified B group vitamins: Secondary | ICD-10-CM | POA: Diagnosis not present

## 2019-05-22 DIAGNOSIS — G44319 Acute post-traumatic headache, not intractable: Secondary | ICD-10-CM | POA: Diagnosis not present

## 2019-05-22 DIAGNOSIS — E611 Iron deficiency: Secondary | ICD-10-CM

## 2019-05-22 DIAGNOSIS — I10 Essential (primary) hypertension: Secondary | ICD-10-CM

## 2019-05-22 LAB — CBC WITH DIFFERENTIAL/PLATELET
Basophils Absolute: 0.1 10*3/uL (ref 0.0–0.1)
Basophils Relative: 1 % (ref 0.0–3.0)
Eosinophils Absolute: 0.1 10*3/uL (ref 0.0–0.7)
Eosinophils Relative: 2.5 % (ref 0.0–5.0)
HCT: 35.3 % — ABNORMAL LOW (ref 36.0–46.0)
Hemoglobin: 11.2 g/dL — ABNORMAL LOW (ref 12.0–15.0)
Lymphocytes Relative: 43.7 % (ref 12.0–46.0)
Lymphs Abs: 2.1 10*3/uL (ref 0.7–4.0)
MCHC: 31.7 g/dL (ref 30.0–36.0)
MCV: 75.3 fl — ABNORMAL LOW (ref 78.0–100.0)
Monocytes Absolute: 0.4 10*3/uL (ref 0.1–1.0)
Monocytes Relative: 8.4 % (ref 3.0–12.0)
Neutro Abs: 2.2 10*3/uL (ref 1.4–7.7)
Neutrophils Relative %: 44.4 % (ref 43.0–77.0)
Platelets: 214 10*3/uL (ref 150.0–400.0)
RBC: 4.69 Mil/uL (ref 3.87–5.11)
RDW: 15.3 % (ref 11.5–15.5)
WBC: 4.9 10*3/uL (ref 4.0–10.5)

## 2019-05-22 LAB — URINALYSIS, ROUTINE W REFLEX MICROSCOPIC
Bilirubin Urine: NEGATIVE
Hgb urine dipstick: NEGATIVE
Ketones, ur: NEGATIVE
Leukocytes,Ua: NEGATIVE
Nitrite: NEGATIVE
RBC / HPF: NONE SEEN (ref 0–?)
Specific Gravity, Urine: >=1.03 — AB (ref 1.000–1.030)
Total Protein, Urine: NEGATIVE
Urine Glucose: NEGATIVE
Urobilinogen, UA: 0.2 (ref 0.0–1.0)
pH: 5 (ref 5.0–8.0)

## 2019-05-22 LAB — BASIC METABOLIC PANEL
BUN: 18 mg/dL (ref 6–23)
CO2: 25 mEq/L (ref 19–32)
Calcium: 9.2 mg/dL (ref 8.4–10.5)
Chloride: 106 mEq/L (ref 96–112)
Creatinine, Ser: 0.84 mg/dL (ref 0.40–1.20)
GFR: 83.06 mL/min (ref >60.00)
Glucose, Bld: 89 mg/dL (ref 70–99)
Potassium: 4.4 mEq/L (ref 3.5–5.1)
Sodium: 140 mEq/L (ref 135–145)

## 2019-05-22 LAB — LIPID PANEL
Cholesterol: 173 mg/dL (ref 0–200)
HDL: 66.3 mg/dL (ref >39.00)
LDL Cholesterol: 84 mg/dL (ref 0–99)
NonHDL: 106.48
Total CHOL/HDL Ratio: 3
Triglycerides: 112 mg/dL (ref 0.0–149.0)
VLDL: 22.4 mg/dL (ref 0.0–40.0)

## 2019-05-22 LAB — HEPATIC FUNCTION PANEL
ALT: 14 U/L (ref 0–35)
AST: 20 U/L (ref 0–37)
Albumin: 4 g/dL (ref 3.5–5.2)
Alkaline Phosphatase: 54 U/L (ref 39–117)
Bilirubin, Direct: 0.1 mg/dL (ref 0.0–0.3)
Total Bilirubin: 0.4 mg/dL (ref 0.2–1.2)
Total Protein: 7.3 g/dL (ref 6.0–8.3)

## 2019-05-22 LAB — IBC PANEL
Iron: 99 ug/dL (ref 42–145)
Saturation Ratios: 21.6 % (ref 20.0–50.0)
Transferrin: 328 mg/dL (ref 212.0–360.0)

## 2019-05-22 LAB — MICROALBUMIN / CREATININE URINE RATIO
Creatinine,U: 107.7 mg/dL
Microalb Creat Ratio: 0.7 mg/g (ref 0.0–30.0)
Microalb, Ur: <0.7 mg/dL (ref 0.0–1.9)

## 2019-05-22 LAB — HEMOGLOBIN A1C: Hgb A1c MFr Bld: 5.6 % (ref 4.6–6.5)

## 2019-05-22 LAB — TSH: TSH: 3.95 u[IU]/mL (ref 0.35–4.50)

## 2019-05-22 LAB — VITAMIN B12: Vitamin B-12: 188 pg/mL — ABNORMAL LOW (ref 211–911)

## 2019-05-22 LAB — VITAMIN D 25 HYDROXY (VIT D DEFICIENCY, FRACTURES): VITD: 20.59 ng/mL — ABNORMAL LOW (ref 30.00–100.00)

## 2019-05-22 MED ORDER — TRAMADOL HCL 50 MG PO TABS: 50.0000 mg | ORAL_TABLET | Freq: Four times a day (QID) | ORAL | 2 refills | Status: DC | PRN

## 2019-05-22 MED ORDER — ZOSTER VAC RECOMB ADJUVANTED 50 MCG/0.5ML IM SUSR: 0.5000 mL | Freq: Once | INTRAMUSCULAR | 1 refills | Status: AC

## 2019-05-22 MED ORDER — CYCLOBENZAPRINE HCL 5 MG PO TABS: 5.0000 mg | ORAL_TABLET | Freq: Three times a day (TID) | ORAL | 2 refills | Status: DC | PRN

## 2019-05-22 MED ORDER — ALPRAZOLAM 0.25 MG PO TABS: ORAL_TABLET | ORAL | 5 refills | Status: DC

## 2019-05-22 MED ORDER — VITAMIN D (ERGOCALCIFEROL) 1.25 MG (50000 UNIT) PO CAPS: 50000.0000 [IU] | ORAL_CAPSULE | ORAL | 0 refills | Status: DC

## 2019-05-22 NOTE — Patient Instructions (Addendum)
Please take all new medication as prescribed - the muscle relaxer  Please continue all other medications as before, and refills have been done if requested - the tramadol and xanax  Your shingles shot prescription was sent to the pharmacy  Please have the pharmacy call with any other refills you may need.  Please continue your efforts at being more active, low cholesterol diet, and weight control.  You are otherwise up to date with prevention measures today.  Please keep your appointments with your specialists as you may have planned  You will be contacted regarding the referral for: CT head  Please go to the LAB in the Basement (turn left off the elevator) for the tests to be done today  You will be contacted by phone if any changes need to be made immediately.  Otherwise, you will receive a letter about your results with an explanation, but please check with MyChart first.  Please remember to sign up for MyChart if you have not done so, as this will be important to you in the future with finding out test results, communicating by private email, and scheduling acute appointments online when needed.  Please return in 6 months, or sooner if needed
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
## 2019-05-23 LAB — HIV ANTIBODY (ROUTINE TESTING W REFLEX): HIV 1&2 Ab, 4th Generation: NONREACTIVE

## 2019-05-26 ENCOUNTER — Encounter: Payer: Self-pay | Admitting: Internal Medicine

## 2019-05-26 NOTE — Assessment & Plan Note (Signed)
For lorazepam prn,  to f/u any worsening symptoms or concerns

## 2019-05-26 NOTE — Assessment & Plan Note (Addendum)
Intractable, ? Neuritic but cant r/o other - for head CT

## 2019-05-26 NOTE — Assessment & Plan Note (Signed)
Likely msk possibly with underlying djd/ddd, chronic, for tramadol and muscle relaxer prn

## 2019-05-27 ENCOUNTER — Encounter: Payer: Self-pay | Admitting: Internal Medicine

## 2019-05-31 ENCOUNTER — Ambulatory Visit
Admission: RE | Admit: 2019-05-31 | Discharge: 2019-05-31 | Disposition: A | Discharge status: Home / Self Care | Payer: Medicare Other | Source: Ambulatory Visit | Attending: Internal Medicine | Admitting: Internal Medicine

## 2019-05-31 ENCOUNTER — Other Ambulatory Visit: Payer: Self-pay

## 2019-05-31 DIAGNOSIS — G44319 Acute post-traumatic headache, not intractable: Secondary | ICD-10-CM

## 2019-05-31 DIAGNOSIS — R519 Headache, unspecified: Secondary | ICD-10-CM | POA: Diagnosis not present

## 2019-05-31 DIAGNOSIS — S0990XA Unspecified injury of head, initial encounter: Secondary | ICD-10-CM | POA: Diagnosis not present

## 2019-06-26 ENCOUNTER — Ambulatory Visit (INDEPENDENT_AMBULATORY_CARE_PROVIDER_SITE_OTHER): Payer: Medicare Other | Admitting: Internal Medicine

## 2019-06-26 ENCOUNTER — Other Ambulatory Visit: Payer: Self-pay

## 2019-06-26 ENCOUNTER — Encounter: Payer: Self-pay | Admitting: Internal Medicine

## 2019-06-26 DIAGNOSIS — Z20822 Contact with and (suspected) exposure to covid-19: Secondary | ICD-10-CM

## 2019-06-26 DIAGNOSIS — E119 Type 2 diabetes mellitus without complications: Secondary | ICD-10-CM

## 2019-06-26 DIAGNOSIS — Z20828 Contact with and (suspected) exposure to other viral communicable diseases: Secondary | ICD-10-CM

## 2019-06-26 DIAGNOSIS — R197 Diarrhea, unspecified: Secondary | ICD-10-CM

## 2019-06-26 DIAGNOSIS — K219 Gastro-esophageal reflux disease without esophagitis: Secondary | ICD-10-CM

## 2019-06-26 MED ORDER — DIPHENOXYLATE-ATROPINE 2.5-0.025 MG PO TABS: 1.0000 | ORAL_TABLET | Freq: Four times a day (QID) | ORAL | 0 refills | Status: DC | PRN
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
## 2019-06-26 NOTE — Patient Instructions (Signed)
Please take all new medication as prescribed - the lomotil as needed  Please go for the COVID testing or urgent care  Please continue all other medications as before, and refills have been done if requested.  Please have the pharmacy call with any other refills you may need.  Please keep your appointments with your specialists as you may have planned

## 2019-06-28 LAB — NOVEL CORONAVIRUS, NAA: SARS-CoV-2, NAA: NOT DETECTED

## 2019-06-30 ENCOUNTER — Encounter: Payer: Self-pay | Admitting: Internal Medicine

## 2019-06-30 DIAGNOSIS — R197 Diarrhea, unspecified: Secondary | ICD-10-CM | POA: Insufficient documentation

## 2019-06-30 NOTE — Assessment & Plan Note (Signed)
stable overall by history and exam, recent data reviewed with pt, and pt to continue medical treatment as before,  to f/u any worsening symptoms or concerns  

## 2019-06-30 NOTE — Assessment & Plan Note (Signed)
Etiology unclear, ? Viral or food poisoning or IBS or other, symptoms have been benign so far without n/v, for lomotil prn, otc probiotic, and  to f/u any worsening symptoms or concerns

## 2019-07-02 NOTE — Progress Notes (Signed)
Greater than 5 minutes, yet less than 10 minutes of time have been spent researching, coordinating, and implementing care for this patient today.  Thank you for the details you included in the comment boxes. Those details are very helpful in determining the best course of treatment for you and help us to provide the best care.  

## 2019-07-11 DIAGNOSIS — H2513 Age-related nuclear cataract, bilateral: Secondary | ICD-10-CM | POA: Diagnosis not present

## 2019-07-11 DIAGNOSIS — E119 Type 2 diabetes mellitus without complications: Secondary | ICD-10-CM | POA: Diagnosis not present

## 2019-07-11 DIAGNOSIS — H43813 Vitreous degeneration, bilateral: Secondary | ICD-10-CM | POA: Diagnosis not present

## 2019-08-08 ENCOUNTER — Ambulatory Visit: Payer: Medicare Other | Attending: Internal Medicine

## 2019-08-08 ENCOUNTER — Other Ambulatory Visit: Payer: Self-pay

## 2019-08-08 DIAGNOSIS — Z20822 Contact with and (suspected) exposure to covid-19: Secondary | ICD-10-CM

## 2019-08-10 LAB — NOVEL CORONAVIRUS, NAA: SARS-CoV-2, NAA: NOT DETECTED

## 2019-08-27 ENCOUNTER — Ambulatory Visit (INDEPENDENT_AMBULATORY_CARE_PROVIDER_SITE_OTHER): Payer: Medicare Other

## 2019-08-27 ENCOUNTER — Other Ambulatory Visit: Payer: Self-pay

## 2019-08-27 ENCOUNTER — Ambulatory Visit (INDEPENDENT_AMBULATORY_CARE_PROVIDER_SITE_OTHER): Payer: Medicare Other | Admitting: Podiatry

## 2019-08-27 DIAGNOSIS — M7750 Other enthesopathy of unspecified foot: Secondary | ICD-10-CM

## 2019-08-27 DIAGNOSIS — M779 Enthesopathy, unspecified: Secondary | ICD-10-CM

## 2019-08-27 DIAGNOSIS — M79671 Pain in right foot: Secondary | ICD-10-CM

## 2019-08-27 DIAGNOSIS — S92504A Nondisplaced unspecified fracture of right lesser toe(s), initial encounter for closed fracture: Secondary | ICD-10-CM

## 2019-08-28 ENCOUNTER — Other Ambulatory Visit: Payer: Self-pay | Admitting: Podiatry

## 2019-08-28 DIAGNOSIS — M779 Enthesopathy, unspecified: Secondary | ICD-10-CM

## 2019-08-30 ENCOUNTER — Ambulatory Visit: Payer: Medicare Other | Admitting: Podiatry

## 2019-09-01 NOTE — Progress Notes (Signed)
Subjective: 64 year old female presents the office today for concerns of possible fracture to the right fourth toe which happened 2 weeks ago she hit her toe on a sofa.  The toe is still painful and swollen.  Also since I last saw her she had a fall fracturing her right medial malleolus and she was under the care of orthopedics.  She states that she has been cleared but she still gets some discomfort at times to the ankle.  No recent injury or falls otherwise. Denies any systemic complaints such as fevers, chills, nausea, vomiting. No acute changes since last appointment, and no other complaints at this time.   Objective: AAO x3, NAD DP/PT pulses palpable bilaterally, CRT less than 3 seconds There is tenderness on the left fourth toe with mild swelling to the right fourth toe.  There is no erythema or warmth.  There is no skin breakdown and there is no signs of infection.  No pain in the metatarsals.  Mild discomfort on the medial aspect the ankle but no specific area pinpoint tenderness.  Appears to be more on the course of the flexor, posterior tibial tendon as well as the anterior ankle joint line.  No pain to the Achilles tendon.  Flexor, extensor tendons appear to be intact. No open lesions or pre-ulcerative lesions.  No pain with calf compression, swelling, warmth, erythema  Assessment: Right fourth toe fracture, right ankle pain  Plan: -All treatment options discussed with the patient including all alternatives, risks, complications.  -X-rays obtained reviewed.  There is radiolucency in the fourth digit consistent with a fracture.  No evidence of acute fracture to the ankle. -Given the continued pain and swelling to the fourth toe was going to immobilize in a surgical shoe but this was causing discomfort to her ankle.  Discussed wearing a stiffer soled shoe and if she needed to she can wear the surgical boot that she has at home if needed and cannot wear shoe.  Discussed buddy splinting the  toes. -In regards to the ankle discussed physical therapy.  She will consider. Discussed home exercises and wear supportive shoes.  -Patient encouraged to call the office with any questions, concerns, change in symptoms.   Trula Slade DPM

## 2019-09-02 ENCOUNTER — Ambulatory Visit: Payer: Medicare Other | Admitting: Podiatry

## 2019-09-17 ENCOUNTER — Other Ambulatory Visit: Payer: Self-pay | Admitting: Podiatry

## 2019-09-17 ENCOUNTER — Ambulatory Visit (INDEPENDENT_AMBULATORY_CARE_PROVIDER_SITE_OTHER): Payer: Medicare Other | Admitting: Podiatry

## 2019-09-17 ENCOUNTER — Other Ambulatory Visit: Payer: Self-pay

## 2019-09-17 ENCOUNTER — Ambulatory Visit (INDEPENDENT_AMBULATORY_CARE_PROVIDER_SITE_OTHER): Payer: Medicare Other

## 2019-09-17 DIAGNOSIS — S92504D Nondisplaced unspecified fracture of right lesser toe(s), subsequent encounter for fracture with routine healing: Secondary | ICD-10-CM

## 2019-09-17 DIAGNOSIS — S82891S Other fracture of right lower leg, sequela: Secondary | ICD-10-CM | POA: Diagnosis not present

## 2019-09-17 DIAGNOSIS — M779 Enthesopathy, unspecified: Secondary | ICD-10-CM

## 2019-09-17 DIAGNOSIS — S92504A Nondisplaced unspecified fracture of right lesser toe(s), initial encounter for closed fracture: Secondary | ICD-10-CM

## 2019-09-17 NOTE — Progress Notes (Signed)
Subjective: 64 year old female presents the office today for follow-up evaluation of a fracture of the right fourth toe.  She has been wearing the offloading boot and she states that she has been feeling much better and the swelling has improved.  She presents today wearing a regular shoe and she states it feels comfortable to any pain.  She also states pain to the ankle.  She had a fracture in July 2020 and was treated by orthopedics.  She still gets some pain and swelling to the hospital ankle. Denies any systemic complaints such as fevers, chills, nausea, vomiting. No acute changes since last appointment, and no other complaints at this time.   Objective: AAO x3, NAD DP/PT pulses palpable bilaterally, CRT less than 3 seconds The majority tenderness in the right ankle to the lateral aspect less than the course of the lateral ankle ligament complex as well as the peroneal tendon.  Flexor and extensor tendons appear to be intact.  Mild discomfort medial ankle as well but majority todays and lateral. Heel spur present in the posterior aspect.  No pain today and no pain along the achilles tendon.  Mild swelling to the right fourth toe.  There is no skin breakdown.  No tenderness palpation to the toes in rectus position. No pain with calf compression, swelling, warmth, erythema  Assessment: Right fourth toe fracture with healing; right ankle tendinitis on the pain status post fracture  Plan: -All treatment options discussed with the patient including all alternatives, risks, complications.  -X-rays obtained and reviewed with the foot.  Fracture present fourth proximal phalanx, there is callus formation present. -In regard to the toe fracture she is doing well.  She can continue wearing regular shoe and discussed buddy splinting. -Will start PT for the ankle. Referral placed for [facility].  -Patient encouraged to call the office with any questions, concerns, change in symptoms.   Trula Slade  DPM

## 2019-09-23 DIAGNOSIS — M25571 Pain in right ankle and joints of right foot: Secondary | ICD-10-CM | POA: Diagnosis not present

## 2019-09-23 DIAGNOSIS — M25471 Effusion, right ankle: Secondary | ICD-10-CM | POA: Diagnosis not present

## 2019-09-23 DIAGNOSIS — R269 Unspecified abnormalities of gait and mobility: Secondary | ICD-10-CM | POA: Diagnosis not present

## 2019-09-23 DIAGNOSIS — M25671 Stiffness of right ankle, not elsewhere classified: Secondary | ICD-10-CM | POA: Diagnosis not present

## 2019-09-27 DIAGNOSIS — M25671 Stiffness of right ankle, not elsewhere classified: Secondary | ICD-10-CM | POA: Diagnosis not present

## 2019-09-27 DIAGNOSIS — M25571 Pain in right ankle and joints of right foot: Secondary | ICD-10-CM | POA: Diagnosis not present

## 2019-09-27 DIAGNOSIS — M25471 Effusion, right ankle: Secondary | ICD-10-CM | POA: Diagnosis not present

## 2019-09-27 DIAGNOSIS — R269 Unspecified abnormalities of gait and mobility: Secondary | ICD-10-CM | POA: Diagnosis not present

## 2019-10-04 DIAGNOSIS — M25571 Pain in right ankle and joints of right foot: Secondary | ICD-10-CM | POA: Diagnosis not present

## 2019-10-04 DIAGNOSIS — R269 Unspecified abnormalities of gait and mobility: Secondary | ICD-10-CM | POA: Diagnosis not present

## 2019-10-04 DIAGNOSIS — M25471 Effusion, right ankle: Secondary | ICD-10-CM | POA: Diagnosis not present

## 2019-10-04 DIAGNOSIS — M25671 Stiffness of right ankle, not elsewhere classified: Secondary | ICD-10-CM | POA: Diagnosis not present

## 2019-10-07 DIAGNOSIS — R269 Unspecified abnormalities of gait and mobility: Secondary | ICD-10-CM | POA: Diagnosis not present

## 2019-10-07 DIAGNOSIS — M25571 Pain in right ankle and joints of right foot: Secondary | ICD-10-CM | POA: Diagnosis not present

## 2019-10-07 DIAGNOSIS — M25671 Stiffness of right ankle, not elsewhere classified: Secondary | ICD-10-CM | POA: Diagnosis not present

## 2019-10-07 DIAGNOSIS — M25471 Effusion, right ankle: Secondary | ICD-10-CM | POA: Diagnosis not present

## 2019-10-11 DIAGNOSIS — M25471 Effusion, right ankle: Secondary | ICD-10-CM | POA: Diagnosis not present

## 2019-10-11 DIAGNOSIS — R269 Unspecified abnormalities of gait and mobility: Secondary | ICD-10-CM | POA: Diagnosis not present

## 2019-10-11 DIAGNOSIS — M25671 Stiffness of right ankle, not elsewhere classified: Secondary | ICD-10-CM | POA: Diagnosis not present

## 2019-10-11 DIAGNOSIS — M25571 Pain in right ankle and joints of right foot: Secondary | ICD-10-CM | POA: Diagnosis not present

## 2019-10-17 DIAGNOSIS — M25671 Stiffness of right ankle, not elsewhere classified: Secondary | ICD-10-CM | POA: Diagnosis not present

## 2019-10-17 DIAGNOSIS — M25571 Pain in right ankle and joints of right foot: Secondary | ICD-10-CM | POA: Diagnosis not present

## 2019-10-17 DIAGNOSIS — R269 Unspecified abnormalities of gait and mobility: Secondary | ICD-10-CM | POA: Diagnosis not present

## 2019-10-17 DIAGNOSIS — M25471 Effusion, right ankle: Secondary | ICD-10-CM | POA: Diagnosis not present

## 2019-10-18 DIAGNOSIS — M25671 Stiffness of right ankle, not elsewhere classified: Secondary | ICD-10-CM | POA: Diagnosis not present

## 2019-10-18 DIAGNOSIS — M25571 Pain in right ankle and joints of right foot: Secondary | ICD-10-CM | POA: Diagnosis not present

## 2019-10-18 DIAGNOSIS — M25471 Effusion, right ankle: Secondary | ICD-10-CM | POA: Diagnosis not present

## 2019-10-18 DIAGNOSIS — R269 Unspecified abnormalities of gait and mobility: Secondary | ICD-10-CM | POA: Diagnosis not present

## 2019-10-21 DIAGNOSIS — M25571 Pain in right ankle and joints of right foot: Secondary | ICD-10-CM | POA: Diagnosis not present

## 2019-10-21 DIAGNOSIS — M25471 Effusion, right ankle: Secondary | ICD-10-CM | POA: Diagnosis not present

## 2019-10-21 DIAGNOSIS — M25671 Stiffness of right ankle, not elsewhere classified: Secondary | ICD-10-CM | POA: Diagnosis not present

## 2019-10-21 DIAGNOSIS — R269 Unspecified abnormalities of gait and mobility: Secondary | ICD-10-CM | POA: Diagnosis not present

## 2019-10-25 ENCOUNTER — Ambulatory Visit: Payer: Self-pay

## 2019-10-28 ENCOUNTER — Encounter: Payer: Self-pay | Admitting: Internal Medicine

## 2019-10-28 ENCOUNTER — Other Ambulatory Visit: Payer: Self-pay

## 2019-10-28 ENCOUNTER — Other Ambulatory Visit: Payer: Self-pay | Admitting: Internal Medicine

## 2019-10-28 ENCOUNTER — Ambulatory Visit (INDEPENDENT_AMBULATORY_CARE_PROVIDER_SITE_OTHER): Payer: Medicare Other | Admitting: Internal Medicine

## 2019-10-28 VITALS — BP 162/94 | HR 67 | Temp 98.6°F | Ht 64.0 in | Wt 217.5 lb

## 2019-10-28 DIAGNOSIS — E538 Deficiency of other specified B group vitamins: Secondary | ICD-10-CM | POA: Insufficient documentation

## 2019-10-28 DIAGNOSIS — R1032 Left lower quadrant pain: Secondary | ICD-10-CM | POA: Diagnosis not present

## 2019-10-28 DIAGNOSIS — E559 Vitamin D deficiency, unspecified: Secondary | ICD-10-CM | POA: Diagnosis not present

## 2019-10-28 DIAGNOSIS — E038 Other specified hypothyroidism: Secondary | ICD-10-CM

## 2019-10-28 DIAGNOSIS — I10 Essential (primary) hypertension: Secondary | ICD-10-CM | POA: Diagnosis not present

## 2019-10-28 DIAGNOSIS — E785 Hyperlipidemia, unspecified: Secondary | ICD-10-CM | POA: Diagnosis not present

## 2019-10-28 DIAGNOSIS — E119 Type 2 diabetes mellitus without complications: Secondary | ICD-10-CM

## 2019-10-28 LAB — LIPID PANEL
Cholesterol: 170 mg/dL (ref 0–200)
HDL: 71.1 mg/dL (ref >39.00)
LDL Cholesterol: 77 mg/dL (ref 0–99)
NonHDL: 99.2
Total CHOL/HDL Ratio: 2
Triglycerides: 113 mg/dL (ref 0.0–149.0)
VLDL: 22.6 mg/dL (ref 0.0–40.0)

## 2019-10-28 LAB — CBC WITH DIFFERENTIAL/PLATELET
Basophils Absolute: 0 10*3/uL (ref 0.0–0.1)
Basophils Relative: 0.9 % (ref 0.0–3.0)
Eosinophils Absolute: 0.1 10*3/uL (ref 0.0–0.7)
Eosinophils Relative: 1.7 % (ref 0.0–5.0)
HCT: 36.9 % (ref 36.0–46.0)
Hemoglobin: 11.4 g/dL — ABNORMAL LOW (ref 12.0–15.0)
Lymphocytes Relative: 51 % — ABNORMAL HIGH (ref 12.0–46.0)
Lymphs Abs: 2.7 10*3/uL (ref 0.7–4.0)
MCHC: 31 g/dL (ref 30.0–36.0)
MCV: 77.5 fl — ABNORMAL LOW (ref 78.0–100.0)
Monocytes Absolute: 0.4 10*3/uL (ref 0.1–1.0)
Monocytes Relative: 7.7 % (ref 3.0–12.0)
Neutro Abs: 2 10*3/uL (ref 1.4–7.7)
Neutrophils Relative %: 38.7 % — ABNORMAL LOW (ref 43.0–77.0)
Platelets: 224 10*3/uL (ref 150.0–400.0)
RBC: 4.76 Mil/uL (ref 3.87–5.11)
RDW: 14.8 % (ref 11.5–15.5)
WBC: 5.2 10*3/uL (ref 4.0–10.5)

## 2019-10-28 LAB — BASIC METABOLIC PANEL
BUN: 14 mg/dL (ref 6–23)
CO2: 28 mEq/L (ref 19–32)
Calcium: 9.1 mg/dL (ref 8.4–10.5)
Chloride: 103 mEq/L (ref 96–112)
Creatinine, Ser: 0.9 mg/dL (ref 0.40–1.20)
GFR: 76.59 mL/min (ref >60.00)
Glucose, Bld: 92 mg/dL (ref 70–99)
Potassium: 4.4 mEq/L (ref 3.5–5.1)
Sodium: 138 mEq/L (ref 135–145)

## 2019-10-28 LAB — HEMOGLOBIN A1C: Hgb A1c MFr Bld: 5.5 % (ref 4.6–6.5)

## 2019-10-28 LAB — HEPATIC FUNCTION PANEL
ALT: 20 U/L (ref 0–35)
AST: 29 U/L (ref 0–37)
Albumin: 3.9 g/dL (ref 3.5–5.2)
Alkaline Phosphatase: 51 U/L (ref 39–117)
Bilirubin, Direct: 0 mg/dL (ref 0.0–0.3)
Total Bilirubin: 0.4 mg/dL (ref 0.2–1.2)
Total Protein: 7.6 g/dL (ref 6.0–8.3)

## 2019-10-28 LAB — VITAMIN D 25 HYDROXY (VIT D DEFICIENCY, FRACTURES): VITD: 30.81 ng/mL (ref 30.00–100.00)

## 2019-10-28 LAB — VITAMIN B12: Vitamin B-12: 153 pg/mL — ABNORMAL LOW (ref 211–911)

## 2019-10-28 MED ORDER — B-12 2500 MCG PO TABS: 2500.0000 ug | ORAL_TABLET | Freq: Every day | ORAL | 3 refills | Status: DC

## 2019-10-28 NOTE — Assessment & Plan Note (Addendum)
Etiology unclear, likley functional I suspect, exam benign, for cbc with labs  I spent 35 minutes in preparing to see the patient by review of recent labs, imaging and procedures, obtaining and reviewing separately obtained history, communicating with the patient and family or caregiver, ordering medications, tests or procedures, and documenting clinical information in the EHR including the differential Dx, treatment, and any further evaluation and other management of abd pain, DM, HTN, HLD, hypothyroidism, b12 and D deficiency
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
## 2019-10-28 NOTE — Assessment & Plan Note (Signed)
Mild elevated today, for BP f/U BP at home and next visit

## 2019-10-28 NOTE — Assessment & Plan Note (Signed)
stable overall by history and exam, recent data reviewed with pt, and pt to continue medical treatment as before,  to f/u any worsening symptoms or concerns  

## 2019-10-28 NOTE — Patient Instructions (Signed)

## 2019-10-28 NOTE — Assessment & Plan Note (Signed)
For oral replacement 

## 2019-10-28 NOTE — Assessment & Plan Note (Signed)
Continue replacement 

## 2019-10-29 ENCOUNTER — Encounter: Payer: Self-pay | Admitting: Internal Medicine

## 2019-10-29 ENCOUNTER — Encounter: Payer: Self-pay | Admitting: Podiatry

## 2019-10-29 ENCOUNTER — Ambulatory Visit (INDEPENDENT_AMBULATORY_CARE_PROVIDER_SITE_OTHER): Payer: Medicare Other | Admitting: Podiatry

## 2019-10-29 ENCOUNTER — Ambulatory Visit (INDEPENDENT_AMBULATORY_CARE_PROVIDER_SITE_OTHER): Payer: Medicare Other

## 2019-10-29 VITALS — Temp 98.0°F | Resp 14

## 2019-10-29 DIAGNOSIS — S92504A Nondisplaced unspecified fracture of right lesser toe(s), initial encounter for closed fracture: Secondary | ICD-10-CM

## 2019-10-29 DIAGNOSIS — S92504D Nondisplaced unspecified fracture of right lesser toe(s), subsequent encounter for fracture with routine healing: Secondary | ICD-10-CM

## 2019-10-29 DIAGNOSIS — M7750 Other enthesopathy of unspecified foot: Secondary | ICD-10-CM

## 2019-10-30 DIAGNOSIS — M25571 Pain in right ankle and joints of right foot: Secondary | ICD-10-CM | POA: Diagnosis not present

## 2019-10-30 DIAGNOSIS — M25671 Stiffness of right ankle, not elsewhere classified: Secondary | ICD-10-CM | POA: Diagnosis not present

## 2019-10-30 DIAGNOSIS — R269 Unspecified abnormalities of gait and mobility: Secondary | ICD-10-CM | POA: Diagnosis not present

## 2019-10-30 DIAGNOSIS — M25471 Effusion, right ankle: Secondary | ICD-10-CM | POA: Diagnosis not present

## 2019-10-30 MED ORDER — AMLODIPINE BESYLATE 5 MG PO TABS: 5.0000 mg | ORAL_TABLET | Freq: Every day | ORAL | 3 refills | Status: DC

## 2019-10-30 NOTE — Progress Notes (Signed)
Subjective: 64 year old female presents the office today for follow evaluation of right fourth toe fracture as well as foot and ankle pain.  She states that she is having no pain to the toe and she feels that the toe is healed.  Physical therapy is been very helpful and she wants to extend this.  No significant pain to the ankle currently but occasional discomfort and tightness. Denies any systemic complaints such as fevers, chills, nausea, vomiting. No acute changes since last appointment, and no other complaints at this time.   Objective: AAO x3, NAD DP/PT pulses palpable bilaterally, CRT less than 3 seconds Trace edema to the right fourth toe there is no tenderness.  The toes in rectus position.  No significant tenderness of the ankle particularly on the lateral aspect.  Flexor, extensor tendons appear to be intact. No open lesions or pre-ulcerative lesions.  No pain with calf compression, swelling, warmth, erythema  Assessment: 64 year old female right fourth toe fracture with healing; right ankle tendinitis status post fracture with improvement  Plan: -All treatment options discussed with the patient including all alternatives, risks, complications.  -X-rays obtained reviewed.  Fracture present of the fourth proximal phalanx there is increased consolidation of bone callus is formed. -There is no pain of the toe.  Continue supportive shoes and activity as tolerated -Prescription for physical therapy written today.  This has been helpful for her.  Continue with supportive shoes. -Patient encouraged to call the office with any questions, concerns, change in symptoms.   Trula Slade DPM

## 2019-11-04 DIAGNOSIS — M25571 Pain in right ankle and joints of right foot: Secondary | ICD-10-CM | POA: Diagnosis not present

## 2019-11-04 DIAGNOSIS — M25671 Stiffness of right ankle, not elsewhere classified: Secondary | ICD-10-CM | POA: Diagnosis not present

## 2019-11-04 DIAGNOSIS — M25471 Effusion, right ankle: Secondary | ICD-10-CM | POA: Diagnosis not present

## 2019-11-04 DIAGNOSIS — R269 Unspecified abnormalities of gait and mobility: Secondary | ICD-10-CM | POA: Diagnosis not present

## 2019-11-06 ENCOUNTER — Ambulatory Visit: Payer: Medicare Other

## 2019-11-06 ENCOUNTER — Encounter: Payer: Self-pay | Admitting: Internal Medicine

## 2019-11-07 DIAGNOSIS — M25571 Pain in right ankle and joints of right foot: Secondary | ICD-10-CM | POA: Diagnosis not present

## 2019-11-07 DIAGNOSIS — R269 Unspecified abnormalities of gait and mobility: Secondary | ICD-10-CM | POA: Diagnosis not present

## 2019-11-07 DIAGNOSIS — M25471 Effusion, right ankle: Secondary | ICD-10-CM | POA: Diagnosis not present

## 2019-11-07 DIAGNOSIS — M25671 Stiffness of right ankle, not elsewhere classified: Secondary | ICD-10-CM | POA: Diagnosis not present

## 2019-11-07 MED ORDER — AMLODIPINE BESYLATE 10 MG PO TABS: 10.0000 mg | ORAL_TABLET | Freq: Every day | ORAL | 3 refills | Status: DC

## 2019-11-07 MED ORDER — TELMISARTAN 40 MG PO TABS: 40.0000 mg | ORAL_TABLET | Freq: Every day | ORAL | 3 refills | Status: DC

## 2019-11-11 DIAGNOSIS — R269 Unspecified abnormalities of gait and mobility: Secondary | ICD-10-CM | POA: Diagnosis not present

## 2019-11-11 DIAGNOSIS — M25571 Pain in right ankle and joints of right foot: Secondary | ICD-10-CM | POA: Diagnosis not present

## 2019-11-11 DIAGNOSIS — M25471 Effusion, right ankle: Secondary | ICD-10-CM | POA: Diagnosis not present

## 2019-11-11 DIAGNOSIS — M25671 Stiffness of right ankle, not elsewhere classified: Secondary | ICD-10-CM | POA: Diagnosis not present

## 2019-11-13 ENCOUNTER — Ambulatory Visit (INDEPENDENT_AMBULATORY_CARE_PROVIDER_SITE_OTHER): Payer: Medicare Other

## 2019-11-13 ENCOUNTER — Other Ambulatory Visit: Payer: Self-pay

## 2019-11-13 VITALS — BP 140/90 | HR 68 | Temp 98.2°F | Resp 16 | Ht 64.0 in | Wt 217.8 lb

## 2019-11-13 DIAGNOSIS — Z0000 Encounter for general adult medical examination without abnormal findings: Secondary | ICD-10-CM | POA: Diagnosis not present
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
## 2019-11-13 NOTE — Patient Instructions (Addendum)
[name] , Thank you for taking time to come for your Medicare Wellness Visit. I appreciate your ongoing commitment to your health goals. Please review the following plan we discussed and let me know if I can assist you in the future.   Screening recommendations/referrals: Colorectal Screening: up to date; due 03/2028 Mammogram: last done 05/03/2016 Bone Density: last done 10/22/2014  Vision and Dental Exams: Recommended annual ophthalmology exams for early detection of glaucoma and other disorders of the eye Recommended annual dental exams for proper oral hygiene  Diabetic Exams: Diabetic Eye Exam: last done 09/10/2019 Diabetic Foot Exam: last done 10/28/2019  Vaccinations: Influenza vaccine: overdue; last done 06/08/2017  Pneumococcal vaccine: up to date; last done 10/04/2017 & 10/23/2018  Tdap vaccine: last done 08/09/2016; renew every 10 years  Shingles vaccine: Please call your insurance company to determine your out of pocket expense for the Shingrix vaccine. You may receive this vaccine at your local pharmacy.  Covid vaccine: up to date; last done 10/16/2019 & 11/06/2019 Therapist, music)  Advanced directives: Advance directives discussed with you today.  Please bring a copy of your POA (Power of [place]) and/or Living Will to your next appointment.  Goals:  Recommend to drink at least 6-8 8oz glasses of water per day.  Recommend to exercise for at least 150 minutes per week.  Recommend to remove any items from the home that may cause slips or trips.  Recommend to decrease portion sizes by eating 3 small healthy meals and at least 2 healthy snacks per day.  Recommend to begin DASH diet as directed below  Recommend to continue efforts to reduce smoking habits until no longer smoking. Smoking Cessation literature is attached below.   Next appointment: Please schedule your Annual Wellness Visit with your Nurse Health Advisor in one year.  Preventive Care 40-64 Years, Female Preventive  care refers to lifestyle choices and visits with your health care provider that can promote health and wellness. What does preventive care include?  A yearly physical exam. This is also called an annual well check.  Dental exams once or twice a year.  Routine eye exams. Ask your health care provider how often you should have your eyes checked.  Personal lifestyle choices, including:  Daily care of your teeth and gums.  Regular physical activity.  Eating a healthy diet.  Avoiding tobacco and drug use.  Limiting alcohol use.  Practicing safe sex.  Taking low-dose aspirin daily starting at age 1 if recommended by your health care provider.  Taking vitamin and mineral supplements as recommended by your health care provider. What happens during an annual well check? The services and screenings done by your health care provider during your annual well check will depend on your age, overall health, lifestyle risk factors, and family history of disease. Counseling  Your health care provider may ask you questions about your:  Alcohol use.  Tobacco use.  Drug use.  Emotional well-being.  Home and relationship well-being.  Sexual activity.  Eating habits.  Work and work Statistician.  Method of birth control.  Menstrual cycle.  Pregnancy history. Screening  You may have the following tests or measurements:  Height, weight, and BMI.  Blood pressure.  Lipid and cholesterol levels. These may be checked every 5 years, or more frequently if you are over 31 years old.  Skin check.  Lung cancer screening. You may have this screening every year starting at age 42 if you have a 30-pack-year history of smoking and currently smoke  or have quit within the past 15 years.  Fecal occult blood test (FOBT) of the stool. You may have this test every year starting at age 69.  Flexible sigmoidoscopy or colonoscopy. You may have a sigmoidoscopy every 5 years or a colonoscopy every  10 years starting at age 59.  Hepatitis C blood test.  Hepatitis B blood test.  Sexually transmitted disease (STD) testing.  Diabetes screening. This is done by checking your blood sugar (glucose) after you have not eaten for a while (fasting). You may have this done every 1-3 years.  Mammogram. This may be done every 1-2 years. Talk to your health care provider about when you should start having regular mammograms. This may depend on whether you have a family history of breast cancer.  BRCA-related cancer screening. This may be done if you have a family history of breast, ovarian, tubal, or peritoneal cancers.  Pelvic exam and Pap test. This may be done every 3 years starting at age 54. Starting at age 33, this may be done every 5 years if you have a Pap test in combination with an HPV test.  Bone density scan. This is done to screen for osteoporosis. You may have this scan if you are at high risk for osteoporosis. Discuss your test results, treatment options, and if necessary, the need for more tests with your health care provider. Vaccines  Your health care provider may recommend certain vaccines, such as:  Influenza vaccine. This is recommended every year.  Tetanus, diphtheria, and acellular pertussis (Tdap, Td) vaccine. You may need a Td booster every 10 years.  Zoster vaccine. You may need this after age 18.  Pneumococcal 13-valent conjugate (PCV13) vaccine. You may need this if you have certain conditions and were not previously vaccinated.  Pneumococcal polysaccharide (PPSV23) vaccine. You may need one or two doses if you smoke cigarettes or if you have certain conditions. Talk to your health care provider about which screenings and vaccines you need and how often you need them. This information is not intended to replace advice given to you by your health care provider. Make sure you discuss any questions you have with your health care provider. Document Released: 08/21/2015  Document Revised: 04/13/2016 Document Reviewed: 05/26/2015 Elsevier Interactive Patient Education  2017 [employer] Prevention in the Home Falls can cause injuries. They can happen to people of all ages. There are many things you can do to make your home safe and to help prevent falls. What can I do on the outside of my home?  Regularly fix the edges of walkways and driveways and fix any cracks.  Remove anything that might make you trip as you walk through a door, such as a raised step or threshold.  Trim any bushes or trees on the path to your home.  Use bright outdoor lighting.  Clear any walking paths of anything that might make someone trip, such as rocks or tools.  Regularly check to see if handrails are loose or broken. Make sure that both sides of any steps have handrails.  Any raised decks and porches should have guardrails on the edges.  Have any leaves, snow, or ice cleared regularly.  Use sand or salt on walking paths during winter.  Clean up any spills in your garage right away. This includes oil or grease spills. What can I do in the bathroom?  Use night lights.  Install grab bars by the toilet and in the tub and shower.  Do not use towel bars as grab bars.  Use non-skid mats or decals in the tub or shower.  If you need to sit down in the shower, use a plastic, non-slip stool.  Keep the floor dry. Clean up any water that spills on the floor as soon as it happens.  Remove soap buildup in the tub or shower regularly.  Attach bath mats securely with double-sided non-slip rug tape.  Do not have throw rugs and other things on the floor that can make you trip. What can I do in the bedroom?  Use night lights.  Make sure that you have a light by your bed that is easy to reach.  Do not use any sheets or blankets that are too big for your bed. They should not hang down onto the floor.  Have a firm chair that has side arms. You can use this for support  while you get dressed.  Do not have throw rugs and other things on the floor that can make you trip. What can I do in the kitchen?  Clean up any spills right away.  Avoid walking on wet floors.  Keep items that you use a lot in easy-to-reach places.  If you need to reach something above you, use a strong step stool that has a grab bar.  Keep electrical cords out of the way.  Do not use floor polish or wax that makes floors slippery. If you must use wax, use non-skid floor wax.  Do not have throw rugs and other things on the floor that can make you trip. What can I do with my stairs?  Do not leave any items on the stairs.  Make sure that there are handrails on both sides of the stairs and use them. Fix handrails that are broken or loose. Make sure that handrails are as long as the stairways.  Check any carpeting to make sure that it is firmly attached to the stairs. Fix any carpet that is loose or worn.  Avoid having throw rugs at the top or bottom of the stairs. If you do have throw rugs, attach them to the floor with carpet tape.  Make sure that you have a light switch at the top of the stairs and the bottom of the stairs. If you do not have them, ask someone to add them for you. What else can I do to help prevent falls?  Wear shoes that:  Do not have high heels.  Have rubber bottoms.  Are comfortable and fit you well.  Are closed at the toe. Do not wear sandals.  If you use a stepladder:  Make sure that it is fully opened. Do not climb a closed stepladder.  Make sure that both sides of the stepladder are locked into place.  Ask someone to hold it for you, if possible.  Clearly mark and make sure that you can see:  Any grab bars or handrails.  First and last steps.  Where the edge of each step is.  Use tools that help you move around (mobility aids) if they are needed. These include:  Canes.  Walkers.  Scooters.  Crutches.  Turn on the lights when  you go into a dark area. Replace any light bulbs as soon as they burn out.  Set up your furniture so you have a clear path. Avoid moving your furniture around.  If any of your floors are uneven, fix them.  If there are any pets around you, be  aware of where they are.  Review your medicines with your doctor. Some medicines can make you feel dizzy. This can increase your chance of falling. Ask your doctor what other things that you can do to help prevent falls. This information is not intended to replace advice given to you by your health care provider. Make sure you discuss any questions you have with your health care provider. Document Released: 05/21/2009 Document Revised: 12/31/2015 Document Reviewed: 08/29/2014 Elsevier Interactive Patient Education  2017 [employer].

## 2019-11-14 ENCOUNTER — Other Ambulatory Visit: Payer: Self-pay | Admitting: Internal Medicine

## 2019-11-14 DIAGNOSIS — R269 Unspecified abnormalities of gait and mobility: Secondary | ICD-10-CM | POA: Diagnosis not present

## 2019-11-14 DIAGNOSIS — M25671 Stiffness of right ankle, not elsewhere classified: Secondary | ICD-10-CM | POA: Diagnosis not present

## 2019-11-14 DIAGNOSIS — M25471 Effusion, right ankle: Secondary | ICD-10-CM | POA: Diagnosis not present

## 2019-11-14 DIAGNOSIS — M25571 Pain in right ankle and joints of right foot: Secondary | ICD-10-CM | POA: Diagnosis not present

## 2019-11-14 NOTE — Telephone Encounter (Signed)
Please refill as per office routine med refill policy (all routine meds refilled for 3 mo or monthly per pt preference up to one year from last visit, then month to month grace period for 3 mo, then further med refills will have to be denied)  

## 2019-11-20 ENCOUNTER — Ambulatory Visit: Payer: Medicare Other | Admitting: Internal Medicine

## 2019-11-25 ENCOUNTER — Ambulatory Visit (INDEPENDENT_AMBULATORY_CARE_PROVIDER_SITE_OTHER): Payer: Medicare Other | Admitting: Internal Medicine

## 2019-11-25 ENCOUNTER — Encounter: Payer: Self-pay | Admitting: Internal Medicine

## 2019-11-25 ENCOUNTER — Other Ambulatory Visit: Payer: Self-pay

## 2019-11-25 DIAGNOSIS — E119 Type 2 diabetes mellitus without complications: Secondary | ICD-10-CM

## 2019-11-25 DIAGNOSIS — I10 Essential (primary) hypertension: Secondary | ICD-10-CM

## 2019-11-25 DIAGNOSIS — E785 Hyperlipidemia, unspecified: Secondary | ICD-10-CM

## 2019-11-25 MED ORDER — TELMISARTAN 80 MG PO TABS: 80.0000 mg | ORAL_TABLET | Freq: Every day | ORAL | 3 refills | Status: DC
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
## 2019-11-25 NOTE — Patient Instructions (Signed)
Ok to increase the telmisartan to 80 mg per day  Please continue all other medications as before, and refills have been done if requested.  Please have the pharmacy call with any other refills you may need.  Please continue your efforts at being more active, low cholesterol diet, and weight control  Please keep your appointments with your specialists as you may have planned  Please make an Appointment to return in 6 months, or sooner if needed

## 2019-11-25 NOTE — Assessment & Plan Note (Signed)
stable overall by history and exam, recent data reviewed with pt, and pt to continue medical treatment as before,  to f/u any worsening symptoms or concerns  

## 2019-11-25 NOTE — Assessment & Plan Note (Signed)
Uncontrolled, for increased micardis to 80 qd, cont all other tx,  to f/u any worsening symptoms or concerns

## 2019-12-03 ENCOUNTER — Encounter: Payer: Self-pay | Admitting: Internal Medicine

## 2019-12-06 ENCOUNTER — Encounter: Payer: Self-pay | Admitting: Internal Medicine

## 2019-12-10 ENCOUNTER — Telehealth: Payer: Self-pay

## 2019-12-10 MED ORDER — ALPRAZOLAM 0.25 MG PO TABS: ORAL_TABLET | ORAL | 5 refills | Status: DC

## 2019-12-10 NOTE — Telephone Encounter (Signed)
Done erx 

## 2019-12-17 ENCOUNTER — Encounter: Payer: Self-pay | Admitting: Internal Medicine

## 2019-12-18 ENCOUNTER — Encounter: Payer: Self-pay | Admitting: Internal Medicine

## 2019-12-18 MED ORDER — METOPROLOL SUCCINATE ER 50 MG PO TB24: 50.0000 mg | ORAL_TABLET | Freq: Every day | ORAL | 3 refills | Status: DC

## 2019-12-18 MED ORDER — AMLODIPINE BESYLATE 10 MG PO TABS: 10.0000 mg | ORAL_TABLET | Freq: Every day | ORAL | 3 refills | Status: DC

## 2019-12-18 NOTE — Telephone Encounter (Signed)
Ok to add toprol xl 50 qd- done erx  Continue all other meds

## 2019-12-31 ENCOUNTER — Encounter: Payer: Self-pay | Admitting: Podiatry

## 2019-12-31 ENCOUNTER — Ambulatory Visit (INDEPENDENT_AMBULATORY_CARE_PROVIDER_SITE_OTHER): Payer: Medicare Other | Admitting: Podiatry

## 2019-12-31 ENCOUNTER — Other Ambulatory Visit: Payer: Self-pay

## 2019-12-31 ENCOUNTER — Ambulatory Visit (INDEPENDENT_AMBULATORY_CARE_PROVIDER_SITE_OTHER): Payer: Medicare Other

## 2019-12-31 DIAGNOSIS — M7750 Other enthesopathy of unspecified foot: Secondary | ICD-10-CM | POA: Diagnosis not present

## 2019-12-31 DIAGNOSIS — S92504D Nondisplaced unspecified fracture of right lesser toe(s), subsequent encounter for fracture with routine healing: Secondary | ICD-10-CM

## 2020-01-07 NOTE — Progress Notes (Signed)
Subjective: 64 year old female presents the office today for follow evaluation of right fourth toe fracture as well as foot and ankle pain.  Overall she said she is doing well she is having no discomfort at this time.  No swelling.  She is able to do daily activities of any issues.  No new concerns. Denies any systemic complaints such as fevers, chills, nausea, vomiting. No acute changes since last appointment, and no other complaints at this time.   Objective: AAO x3, NAD DP/PT pulses palpable bilaterally, CRT less than 3 seconds There is no significant edema to the fourth toe or to the ankle.  There is no area pinpoint tenderness to the lower extremities.  There is no pain with range of motion.  Flexor, extensor tendons are intact. No open lesions or pre-ulcerative lesions.  No pain with calf compression, swelling, warmth, erythema  Assessment: 64 year old female right fourth toe fracture with healing; right ankle tendinitis status post fracture with improvement  Plan: -All treatment options discussed with the patient including all alternatives, risks, complications.  -X-rays obtained reviewed.  Fracture still evident of the fourth phalanx with increased consolidation.  Clinically there is no discomfort.  Continue with regular shoes and activity as tolerated.  I will see her back as needed.  Trula Slade DPM

## 2020-03-30 ENCOUNTER — Encounter: Payer: Self-pay | Admitting: Internal Medicine

## 2020-04-21 ENCOUNTER — Encounter: Payer: Self-pay | Admitting: Internal Medicine

## 2020-05-07 ENCOUNTER — Other Ambulatory Visit: Payer: Self-pay | Admitting: Surgery

## 2020-05-07 DIAGNOSIS — L987 Excessive and redundant skin and subcutaneous tissue: Secondary | ICD-10-CM | POA: Diagnosis not present

## 2020-05-07 DIAGNOSIS — Z9884 Bariatric surgery status: Secondary | ICD-10-CM | POA: Diagnosis not present

## 2020-05-07 DIAGNOSIS — D0511 Intraductal carcinoma in situ of right breast: Secondary | ICD-10-CM | POA: Diagnosis not present

## 2020-05-14 DIAGNOSIS — Z23 Encounter for immunization: Secondary | ICD-10-CM | POA: Diagnosis not present

## 2020-05-26 ENCOUNTER — Other Ambulatory Visit: Payer: Self-pay | Admitting: Podiatry

## 2020-05-26 NOTE — Telephone Encounter (Signed)
Please advise 

## 2020-06-09 ENCOUNTER — Ambulatory Visit: Payer: Medicare Other | Admitting: Dietician

## 2020-06-11 NOTE — Progress Notes (Addendum)
COVID Vaccine Completed:  x2 Date COVID Vaccine completed: 10-16-19 & 11-06-19 COVID vaccine manufacturer: [employer]   PCP - Cathlean Cower, MD Cardiologist - Dorris Carnes, MD.  Last OV 2015  Chest x-ray -  EKG - 06-15-20 in Epic Stress Test - 05-19-14 ECHO - 05-02-13 in Epic Cardiac Cath -  Pacemaker/ICD device last checked:  Sleep Study - ?2010.  +sleep apnea, resolved with gastric bypass CPAP - No  Fasting Blood Sugar -  Checks Blood Sugar - does not check, diet controlled diabetes after gastric bypass  Blood Thinner Instructions: Aspirin Instructions: Last Dose:  Anesthesia review: Hx of SOB and chest pain.  Has occasional swelling LE  Patient denies shortness of breath, fever, cough and chest pain at PAT appointment   Patient verbalized understanding of instructions that were given to them at the PAT appointment. Patient was also instructed that they will need to review over the PAT instructions again at home before surgery.

## 2020-06-11 NOTE — Patient Instructions (Signed)
DUE TO COVID-19 ONLY ONE VISITOR IS ALLOWED TO COME WITH YOU AND STAY IN THE WAITING ROOM ONLY DURING PRE OP AND PROCEDURE.   IF YOU WILL BE ADMITTED INTO THE HOSPITAL YOU ARE ALLOWED ONE SUPPORT PERSON DURING VISITATION HOURS ONLY (10AM -8PM)   . The support person may change daily. . The support person must pass our screening, gel in and out, and wear a mask at all times, including in the patient's room. . Patients must also wear a mask when staff or their support person are in the room.   COVID SWAB TESTING MUST BE COMPLETED ON:  Monday, 06-22-20 @ 10:05   [address] [place], [place] [address]  (Must self quarantine after testing. Follow instructions on handout.)        Your procedure is scheduled on: Thursday, 06-25-20   Report to [facility] Main  Entrance   Report to admitting at 8:00 AM   Call this number if you have problems the morning of surgery [phone]   Do not eat food :After Midnight.   May have liquids until 7:00 AM day of surgery  CLEAR LIQUID DIET  Foods Allowed                                                                     Foods Excluded  Water, Black Coffee and tea, regular and decaf               liquids that you cannot  Plain Jell-O in any flavor  (No red)                                     see through such as: Fruit ices (not with fruit pulp)                                      milk, soups, orange juice              Iced Popsicles (No red)                                      All solid food                                   Apple juices Sports drinks like Gatorade (No red) Lightly seasoned clear broth or consume(fat free) Sugar, honey syrup   Oral Hygiene is also important to reduce your risk of infection.                                    Remember - BRUSH YOUR TEETH THE MORNING OF SURGERY WITH YOUR REGULAR TOOTHPASTE   Do NOT smoke after Midnight   Take these medicines the morning of surgery with A SIP OF WATER: Amlodipine,  Levothyroxine, Metoprolol.  Okay to use inhalers and  bring with you day  of surgery.  DO NOT TAKE ANY ORAL DIABETIC MEDICATIONS DAY OF YOUR SURGERY                               You may not have any metal on your body including hair pins, jewelry, and body piercings             Do not wear make-up, lotions, powders, perfumes/cologne, or deodorant             Do not wear nail polish.  Do not shave  48 hours prior to surgery.                Do not bring valuables to the hospital. [place].   Contacts, dentures or bridgework may not be worn into surgery.    Patients discharged the day of surgery will not be allowed to drive home.                Please read over the following fact sheets you were given: IF YOU HAVE QUESTIONS ABOUT YOUR PRE OP INSTRUCTIONS PLEASE CALL [phone]   [place] - Preparing for Surgery Before surgery, you can play an important role.  Because skin is not sterile, your skin needs to be as free of germs as possible.  You can reduce the number of germs on your skin by washing with CHG (chlorahexidine gluconate) soap before surgery.  CHG is an antiseptic cleaner which kills germs and bonds with the skin to continue killing germs even after washing. Please DO NOT use if you have an allergy to CHG or antibacterial soaps.  If your skin becomes reddened/irritated stop using the CHG and inform your nurse when you arrive at Short Stay. Do not shave (including legs and underarms) for at least 48 hours prior to the first CHG shower.  You may shave your face/neck.  Please follow these instructions carefully:  1.  Shower with CHG Soap the night before surgery and the  morning of surgery.  2.  If you choose to wash your hair, wash your hair first as usual with your normal  shampoo.  3.  After you shampoo, rinse your hair and body thoroughly to remove the shampoo.                             4.  Use CHG as you would any other liquid soap.   You can apply chg directly to the skin and wash.  Gently with a scrungie or clean washcloth.  5.  Apply the CHG Soap to your body ONLY FROM THE NECK DOWN.   Do   not use on face/ open                           Wound or open sores. Avoid contact with eyes, ears mouth and   genitals (private parts).                       Wash face,  Genitals (private parts) with your normal soap.             6.  Wash thoroughly, paying special attention to the area where your    surgery  will be performed.  7.  Thoroughly rinse your body with warm water from the neck  down.  8.  DO NOT shower/wash with your normal soap after using and rinsing off the CHG Soap.                9.  Pat yourself dry with a clean towel.            10.  Wear clean pajamas.            11.  Place clean sheets on your bed the night of your first shower and do not  sleep with pets. Day of Surgery : Do not apply any lotions/deodorants the morning of surgery.  Please wear clean clothes to the hospital/surgery center.  FAILURE TO FOLLOW THESE INSTRUCTIONS MAY RESULT IN THE CANCELLATION OF YOUR SURGERY  PATIENT SIGNATURE_________________________________  NURSE SIGNATURE__________________________________  ________________________________________________________________________

## 2020-06-15 ENCOUNTER — Other Ambulatory Visit: Payer: Self-pay

## 2020-06-15 ENCOUNTER — Encounter (HOSPITAL_COMMUNITY): Payer: Self-pay

## 2020-06-15 ENCOUNTER — Encounter (HOSPITAL_COMMUNITY)
Admission: RE | Admit: 2020-06-15 | Discharge: 2020-06-15 | Disposition: A | Discharge status: Home / Self Care | Payer: Medicare Other | Source: Ambulatory Visit | Attending: Surgery | Admitting: Surgery

## 2020-06-15 DIAGNOSIS — I10 Essential (primary) hypertension: Secondary | ICD-10-CM | POA: Insufficient documentation

## 2020-06-15 DIAGNOSIS — Z01818 Encounter for other preprocedural examination: Secondary | ICD-10-CM | POA: Insufficient documentation

## 2020-06-15 LAB — BASIC METABOLIC PANEL
Anion gap: 8 (ref 5–15)
BUN: 20 mg/dL (ref 8–23)
CO2: 24 mmol/L (ref 22–32)
Calcium: 9.2 mg/dL (ref 8.9–10.3)
Chloride: 107 mmol/L (ref 98–111)
Creatinine, Ser: 0.83 mg/dL (ref 0.44–1.00)
GFR, Estimated: >60 mL/min (ref >60)
Glucose, Bld: 103 mg/dL — ABNORMAL HIGH (ref 70–99)
Potassium: 4.1 mmol/L (ref 3.5–5.1)
Sodium: 139 mmol/L (ref 135–145)

## 2020-06-15 LAB — CBC
HCT: 36.2 % (ref 36.0–46.0)
Hemoglobin: 11.1 g/dL — ABNORMAL LOW (ref 12.0–15.0)
MCH: 24.2 pg — ABNORMAL LOW (ref 26.0–34.0)
MCHC: 30.7 g/dL (ref 30.0–36.0)
MCV: 79 fL — ABNORMAL LOW (ref 80.0–100.0)
Platelets: 245 10*3/uL (ref 150–400)
RBC: 4.58 MIL/uL (ref 3.87–5.11)
RDW: 14.6 % (ref 11.5–15.5)
WBC: 5.7 10*3/uL (ref 4.0–10.5)
nRBC: 0 % (ref 0.0–0.2)

## 2020-06-15 LAB — GLUCOSE, CAPILLARY: Glucose-Capillary: 102 mg/dL — ABNORMAL HIGH (ref 70–99)

## 2020-06-15 LAB — HEMOGLOBIN A1C
Hgb A1c MFr Bld: 5.5 % (ref 4.8–5.6)
Mean Plasma Glucose: 111.15 mg/dL

## 2020-06-22 ENCOUNTER — Other Ambulatory Visit (HOSPITAL_COMMUNITY)
Admission: RE | Admit: 2020-06-22 | Discharge: 2020-06-22 | Disposition: A | Discharge status: Home / Self Care | Payer: Medicare Other | Source: Ambulatory Visit | Attending: Surgery | Admitting: Surgery

## 2020-06-22 DIAGNOSIS — Z20822 Contact with and (suspected) exposure to covid-19: Secondary | ICD-10-CM | POA: Insufficient documentation

## 2020-06-22 DIAGNOSIS — Z01812 Encounter for preprocedural laboratory examination: Secondary | ICD-10-CM | POA: Insufficient documentation

## 2020-06-22 LAB — SARS CORONAVIRUS 2 (TAT 6-24 HRS): SARS Coronavirus 2: NEGATIVE
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
## 2020-06-25 ENCOUNTER — Ambulatory Visit (HOSPITAL_COMMUNITY)
Admission: RE | Admit: 2020-06-25 | Discharge: 2020-06-25 | Disposition: A | Discharge status: Home / Self Care | Payer: Medicare Other | Attending: Surgery | Admitting: Surgery

## 2020-06-25 ENCOUNTER — Encounter (HOSPITAL_COMMUNITY): Payer: Self-pay | Admitting: Surgery

## 2020-06-25 ENCOUNTER — Ambulatory Visit (HOSPITAL_COMMUNITY): Payer: Medicare Other | Admitting: Physician Assistant

## 2020-06-25 ENCOUNTER — Encounter (HOSPITAL_COMMUNITY)
Admission: RE | Disposition: A | Discharge status: Home / Self Care | Payer: Self-pay | Source: Home / Self Care | Attending: Surgery

## 2020-06-25 ENCOUNTER — Ambulatory Visit (HOSPITAL_COMMUNITY): Payer: Medicare Other | Admitting: Certified Registered Nurse Anesthetist

## 2020-06-25 DIAGNOSIS — L987 Excessive and redundant skin and subcutaneous tissue: Secondary | ICD-10-CM | POA: Diagnosis not present

## 2020-06-25 DIAGNOSIS — E669 Obesity, unspecified: Secondary | ICD-10-CM | POA: Diagnosis not present

## 2020-06-25 DIAGNOSIS — Z86000 Personal history of in-situ neoplasm of breast: Secondary | ICD-10-CM | POA: Diagnosis not present

## 2020-06-25 DIAGNOSIS — Z9013 Acquired absence of bilateral breasts and nipples: Secondary | ICD-10-CM | POA: Insufficient documentation

## 2020-06-25 DIAGNOSIS — E559 Vitamin D deficiency, unspecified: Secondary | ICD-10-CM | POA: Diagnosis not present

## 2020-06-25 DIAGNOSIS — Z853 Personal history of malignant neoplasm of breast: Secondary | ICD-10-CM | POA: Diagnosis not present

## 2020-06-25 DIAGNOSIS — Z87891 Personal history of nicotine dependence: Secondary | ICD-10-CM | POA: Diagnosis not present

## 2020-06-25 DIAGNOSIS — Z6838 Body mass index (BMI) 38.0-38.9, adult: Secondary | ICD-10-CM | POA: Diagnosis not present

## 2020-06-25 DIAGNOSIS — Z9884 Bariatric surgery status: Secondary | ICD-10-CM | POA: Insufficient documentation

## 2020-06-25 DIAGNOSIS — D62 Acute posthemorrhagic anemia: Secondary | ICD-10-CM | POA: Diagnosis not present

## 2020-06-25 DIAGNOSIS — I10 Essential (primary) hypertension: Secondary | ICD-10-CM | POA: Diagnosis not present

## 2020-06-25 DIAGNOSIS — L0889 Other specified local infections of the skin and subcutaneous tissue: Secondary | ICD-10-CM | POA: Diagnosis not present

## 2020-06-25 HISTORY — PX: SURGICAL EXCISION OF EXCESSIVE SKIN: SHX6542

## 2020-06-25 LAB — GLUCOSE, CAPILLARY: Glucose-Capillary: 93 mg/dL (ref 70–99)

## 2020-06-25 SURGERY — EXCISION, EXCESS SKIN
Anesthesia: General | Site: Chest

## 2020-06-25 MED ORDER — FENTANYL CITRATE (PF) 100 MCG/2ML IJ SOLN
INTRAMUSCULAR | Status: AC
  Filled 2020-06-25: qty 2

## 2020-06-25 MED ORDER — CHLORHEXIDINE GLUCONATE 0.12 % MT SOLN
15.0000 mL | Freq: Once | OROMUCOSAL | Status: AC
  Administered 2020-06-25: 15 mL via OROMUCOSAL

## 2020-06-25 MED ORDER — ROCURONIUM BROMIDE 10 MG/ML (PF) SYRINGE
PREFILLED_SYRINGE | INTRAVENOUS | Status: AC
  Filled 2020-06-25: qty 10

## 2020-06-25 MED ORDER — ONDANSETRON HCL 4 MG/2ML IJ SOLN
INTRAMUSCULAR | Status: AC
  Filled 2020-06-25: qty 2

## 2020-06-25 MED ORDER — HYDROCODONE-ACETAMINOPHEN 5-325 MG PO TABS: 1.0000 | ORAL_TABLET | Freq: Four times a day (QID) | ORAL | 0 refills | Status: DC | PRN

## 2020-06-25 MED ORDER — SODIUM CHLORIDE 0.9 % IR SOLN
Status: DC | PRN
  Administered 2020-06-25: 1000 mL

## 2020-06-25 MED ORDER — MIDAZOLAM HCL 5 MG/5ML IJ SOLN
INTRAMUSCULAR | Status: DC | PRN
  Administered 2020-06-25: 2 mg via INTRAVENOUS

## 2020-06-25 MED ORDER — DEXMEDETOMIDINE (PRECEDEX) IN NS 20 MCG/5ML (4 MCG/ML) IV SYRINGE
PREFILLED_SYRINGE | INTRAVENOUS | Status: DC | PRN
  Administered 2020-06-25 (×2): 10 ug via INTRAVENOUS

## 2020-06-25 MED ORDER — SUGAMMADEX SODIUM 500 MG/5ML IV SOLN
INTRAVENOUS | Status: AC
  Filled 2020-06-25: qty 5

## 2020-06-25 MED ORDER — ORAL CARE MOUTH RINSE: 15.0000 mL | Freq: Once | OROMUCOSAL | Status: AC

## 2020-06-25 MED ORDER — PROPOFOL 10 MG/ML IV BOLUS
INTRAVENOUS | Status: AC
  Filled 2020-06-25: qty 20

## 2020-06-25 MED ORDER — CHLORHEXIDINE GLUCONATE 4 % EX LIQD: 60.0000 mL | Freq: Once | CUTANEOUS | Status: DC

## 2020-06-25 MED ORDER — LIDOCAINE 2% (20 MG/ML) 5 ML SYRINGE
INTRAMUSCULAR | Status: AC
  Filled 2020-06-25: qty 5

## 2020-06-25 MED ORDER — PROPOFOL 10 MG/ML IV BOLUS
INTRAVENOUS | Status: DC | PRN
  Administered 2020-06-25: 200 mg via INTRAVENOUS

## 2020-06-25 MED ORDER — SUCCINYLCHOLINE CHLORIDE 200 MG/10ML IV SOSY
PREFILLED_SYRINGE | INTRAVENOUS | Status: AC
  Filled 2020-06-25: qty 10

## 2020-06-25 MED ORDER — DEXAMETHASONE SODIUM PHOSPHATE 10 MG/ML IJ SOLN
INTRAMUSCULAR | Status: DC | PRN
  Administered 2020-06-25: 5 mg via INTRAVENOUS

## 2020-06-25 MED ORDER — MIDAZOLAM HCL 2 MG/2ML IJ SOLN
INTRAMUSCULAR | Status: AC
  Filled 2020-06-25: qty 2

## 2020-06-25 MED ORDER — HYDROMORPHONE HCL 1 MG/ML IJ SOLN
INTRAMUSCULAR | Status: AC
  Administered 2020-06-25: 0.25 mg via INTRAVENOUS
  Filled 2020-06-25: qty 1

## 2020-06-25 MED ORDER — SUCCINYLCHOLINE CHLORIDE 200 MG/10ML IV SOSY
PREFILLED_SYRINGE | INTRAVENOUS | Status: DC | PRN
  Administered 2020-06-25: 160 mg via INTRAVENOUS

## 2020-06-25 MED ORDER — ACETAMINOPHEN 500 MG PO TABS
1000.0000 mg | ORAL_TABLET | ORAL | Status: AC
  Administered 2020-06-25: 1000 mg via ORAL
  Filled 2020-06-25: qty 2

## 2020-06-25 MED ORDER — LIDOCAINE HCL (CARDIAC) PF 100 MG/5ML IV SOSY
PREFILLED_SYRINGE | INTRAVENOUS | Status: DC | PRN
  Administered 2020-06-25: 80 mg via INTRAVENOUS

## 2020-06-25 MED ORDER — ROCURONIUM BROMIDE 100 MG/10ML IV SOLN
INTRAVENOUS | Status: DC | PRN
  Administered 2020-06-25: 70 mg via INTRAVENOUS

## 2020-06-25 MED ORDER — BUPIVACAINE-EPINEPHRINE (PF) 0.25% -1:200000 IJ SOLN
INTRAMUSCULAR | Status: AC
  Filled 2020-06-25: qty 30

## 2020-06-25 MED ORDER — LACTATED RINGERS IV SOLN: INTRAVENOUS | Status: DC

## 2020-06-25 MED ORDER — HYDROMORPHONE HCL 1 MG/ML IJ SOLN
0.2500 mg | INTRAMUSCULAR | Status: DC | PRN
  Administered 2020-06-25 (×3): 0.25 mg via INTRAVENOUS

## 2020-06-25 MED ORDER — SUGAMMADEX SODIUM 500 MG/5ML IV SOLN
INTRAVENOUS | Status: DC | PRN
  Administered 2020-06-25: 410 mg via INTRAVENOUS

## 2020-06-25 MED ORDER — ONDANSETRON HCL 4 MG/2ML IJ SOLN
INTRAMUSCULAR | Status: DC | PRN
  Administered 2020-06-25: 4 mg via INTRAVENOUS

## 2020-06-25 MED ORDER — FENTANYL CITRATE (PF) 100 MCG/2ML IJ SOLN
INTRAMUSCULAR | Status: DC | PRN
  Administered 2020-06-25 (×2): 50 ug via INTRAVENOUS
  Administered 2020-06-25: 100 ug via INTRAVENOUS

## 2020-06-25 MED ORDER — DEXAMETHASONE SODIUM PHOSPHATE 10 MG/ML IJ SOLN
INTRAMUSCULAR | Status: AC
  Filled 2020-06-25: qty 1

## 2020-06-25 MED ORDER — BUPIVACAINE-EPINEPHRINE 0.25% -1:200000 IJ SOLN
INTRAMUSCULAR | Status: DC | PRN
  Administered 2020-06-25: 30 mL

## 2020-06-25 SURGICAL SUPPLY — 44 items
BENZOIN TINCTURE PRP APPL 2/3 (GAUZE/BANDAGES/DRESSINGS) IMPLANT
BINDER BREAST XLRG (GAUZE/BANDAGES/DRESSINGS) ×3 IMPLANT
BLADE HEX COATED 2.75 (ELECTRODE) ×3 IMPLANT
BLADE SURG SZ10 CARB STEEL (BLADE) ×6 IMPLANT
CLOSURE WOUND 1/2 X4 (GAUZE/BANDAGES/DRESSINGS)
COVER SURGICAL LIGHT HANDLE (MISCELLANEOUS) ×3 IMPLANT
COVER WAND RF STERILE (DRAPES) IMPLANT
DECANTER SPIKE VIAL GLASS SM (MISCELLANEOUS) IMPLANT
DERMABOND ADVANCED (GAUZE/BANDAGES/DRESSINGS) ×2
DERMABOND ADVANCED .7 DNX12 (GAUZE/BANDAGES/DRESSINGS) ×1 IMPLANT
DRAPE LAPAROTOMY T 102X78X121 (DRAPES) IMPLANT
DRAPE LAPAROTOMY TRNSV 102X78 (DRAPES) IMPLANT
DRAPE SHEET LG 3/4 BI-LAMINATE (DRAPES) ×6 IMPLANT
DRSG PAD ABDOMINAL 8X10 ST (GAUZE/BANDAGES/DRESSINGS) ×3 IMPLANT
ELECT REM PT RETURN 15FT ADLT (MISCELLANEOUS) ×3 IMPLANT
EVACUATOR SILICONE 100CC (DRAIN) IMPLANT
GAUZE SPONGE 4X4 12PLY STRL (GAUZE/BANDAGES/DRESSINGS) ×3 IMPLANT
GLOVE BIOGEL PI IND STRL 7.0 (GLOVE) ×1 IMPLANT
GLOVE BIOGEL PI INDICATOR 7.0 (GLOVE) ×2
GLOVE SURG SYN 7.5  E (GLOVE) ×3
GLOVE SURG SYN 7.5 E (GLOVE) ×1 IMPLANT
GOWN SPEC L4 XLG W/TWL (GOWN DISPOSABLE) ×3 IMPLANT
GOWN STRL REUS W/ TWL XL LVL3 (GOWN DISPOSABLE) ×3 IMPLANT
GOWN STRL REUS W/TWL LRG LVL3 (GOWN DISPOSABLE) ×3 IMPLANT
GOWN STRL REUS W/TWL XL LVL3 (GOWN DISPOSABLE) ×9
KIT BASIN OR (CUSTOM PROCEDURE TRAY) ×3 IMPLANT
KIT TURNOVER KIT A (KITS) IMPLANT
MARKER SKIN DUAL TIP RULER LAB (MISCELLANEOUS) IMPLANT
NS IRRIG 1000ML POUR BTL (IV SOLUTION) ×3 IMPLANT
PACK BASIC VI WITH GOWN DISP (CUSTOM PROCEDURE TRAY) ×3 IMPLANT
PACK GENERAL/GYN (CUSTOM PROCEDURE TRAY) ×3 IMPLANT
PACK UNIVERSAL I (CUSTOM PROCEDURE TRAY) ×3 IMPLANT
PENCIL SMOKE EVACUATOR (MISCELLANEOUS) IMPLANT
SOL PREP POV-IOD 4OZ 10% (MISCELLANEOUS) ×3 IMPLANT
SPONGE LAP 4X18 RFD (DISPOSABLE) ×6 IMPLANT
STAPLER VISISTAT 35W (STAPLE) IMPLANT
STRIP CLOSURE SKIN 1/2X4 (GAUZE/BANDAGES/DRESSINGS) IMPLANT
SUT ETHILON 2 0 PS N (SUTURE) ×12 IMPLANT
SUT MNCRL AB 4-0 PS2 18 (SUTURE) ×12 IMPLANT
SUT SILK 2 0 SH (SUTURE) ×3 IMPLANT
SUT VIC AB 2-0 SH 18 (SUTURE) ×6 IMPLANT
SYR CONTROL 10ML LL (SYRINGE) ×3 IMPLANT
TOWEL OR 17X26 10 PK STRL BLUE (TOWEL DISPOSABLE) ×3 IMPLANT
WATER STERILE IRR 1000ML POUR (IV SOLUTION) ×3 IMPLANT

## 2020-06-25 NOTE — Op Note (Signed)
06/25/2020  10:08 AM  PATIENT:  Elizabeth Hawkins, 64 y.o., female, MRN: [id number]  PREOP DIAGNOSIS:  EXCESS CHEST AND AXILLARY SKIN  POSTOP DIAGNOSIS:   Excess skin of right axilla, over sternum, and left axilla  PROCEDURE:   Procedure(s):   EXCISION EXCESS SKIN RIGHT AXILLA (12 cm x 8 cm), CHEST (15 cm x 6 cm), AND LEFT AXILLA (15 x 5 cm)  SURGEON:   Alphonsa Overall, M.D.  ASSISTANT:   None  ANESTHESIA:   general  Anesthesiologist: Belinda Block, MD CRNA: Rosaland Lao, CRNA  General  EBL:  <75 cc  ml  BLOOD ADMINISTERED: none  DRAINS: none   LOCAL MEDICATIONS USED:   30 cc of 1/4% Marcaine  SPECIMEN:   Right axillary skin, skin over sternum, left axillary skin  COUNTS CORRECT:  YES  INDICATIONS FOR PROCEDURE:  Elizabeth Hawkins is a 64 y.o. (DOB: 1956-06-14) AA female whose primary care physician is Biagio Borg, MD and comes for excision of excess skin in the right axilla, over her sternum, and in the left axilla.   She had bilateral mastectomies in January 2018 for extensive right breast DCIS by Dr. Jacinto Reap. [facility].  She has irritation from the excess skin that is in her right axilla, over her sternum, and in her left axilla.  I marked her chest in the pre op area to try to get the exact skin areas that were bothering her.  She understands that there are limits to what I can do surgically.   The indications and risks of the surgery were explained to the patient.  The risks include, but are not limited to, infection, bleeding, and nerve injury.  PROCEDURE: The patient was taken to room #1 at [facility].  Her chest was prepped with Chloroprep and sterilely draped.  A time out was held and the surgical check list run.    I started excising the skin from the right axilla.  I excised skin and subcutaneous fat about 12 cm x 8 cm.  The excision sort of formed a "T" to her mastectomy incision.  I closed the wound with 2-0 subcutaneous vicryl sutures, 2-0 nylon sutures (4) in  the skin, and a 4-0 running subcuticular Monocryl suture.   I marked the specimen with a long suture cranially and a short suture medially.  I excised skin over the lower sternum.  I excised skin and subcutaneous fat about 15 cm x 6 cm.   I closed the wound with 2-0 subcutaneous vicryl sutures, 2-0 nylon sutures (3) in the skin, and a 4-0 running subcuticular Monocryl suture.   I marked the specimen with a long suture cranially and a short suture to the right.   I excised the skin from the left axilla.  I excised skin and subcutaneous fat about 15 cm x 5 cm.   I closed the wound with 2-0 subcutaneous vicryl sutures, 2-0 nylon sutures (3) in the skin, and a 4-0 running subcuticular Monocryl suture.   I marked the specimen with a long suture cranially and a short suture medially.   The wounds were painted with dermabond and sterilely dressed.  A breast binder was placed on the patient.   Sponge and needle count were correct at the end of the case.  She tolerated the procedure well and was transferred to the recovery room.  Alphonsa Overall, MD, [facility] Surgery Office phone:  [phone]

## 2020-06-25 NOTE — Anesthesia Procedure Notes (Signed)
Procedure Name: Intubation Performed by: Rosaland Lao, CRNA Pre-anesthesia Checklist: Patient identified, Emergency Drugs available, Suction available and Patient being monitored Patient Re-evaluated:Patient Re-evaluated prior to induction Oxygen Delivery Method: Circle system utilized Preoxygenation: Pre-oxygenation with 100% oxygen Induction Type: IV induction and Rapid sequence Ventilation: Mask ventilation without difficulty Laryngoscope Size: Mac and 4 Grade View: Grade II Tube type: Oral Number of attempts: 1 Airway Equipment and Method: Stylet and Oral airway Placement Confirmation: ETT inserted through vocal cords under direct vision,  positive ETCO2 and breath sounds checked- equal and bilateral Secured at: 22 cm Tube secured with: Tape Dental Injury: Teeth and Oropharynx as per pre-operative assessment

## 2020-06-25 NOTE — Transfer of Care (Signed)
Immediate Anesthesia Transfer of Care Note  Patient: Elizabeth Hawkins  Procedure(s) Performed: EXCISION EXCESS SKIN RIGHT AXILLA, CHEST, AND LEFT AXILLA (N/A Chest)  Patient Location: PACU  Anesthesia Type:General  Level of Consciousness: awake, alert  and oriented  Airway & Oxygen Therapy: Patient Spontanous Breathing and Patient connected to face mask  Post-op Assessment: Report given to RN and Post -op Vital signs reviewed and stable  Post vital signs: Reviewed and stable  Last Vitals:  Vitals Value Taken Time  BP 177/82 06/25/20 1219  Temp    Pulse 84 06/25/20 1222  Resp 14 06/25/20 1222  SpO2 97 % 06/25/20 1222  Vitals shown include unvalidated device data.  Last Pain:  Vitals:   06/25/20 0830  TempSrc: Oral  PainSc: 0-No pain         Complications: No complications documented.

## 2020-06-25 NOTE — Interval H&P Note (Signed)
History and Physical Interval Note:  06/25/2020 9:29 AM  Elizabeth Hawkins  has presented today for surgery, with the diagnosis of EXCESS CHEST AND AXILLARY SKIN.  The various methods of treatment have been discussed with the patient and family.   Patient marked.  Daughter Holli Humbles to pick.  After consideration of risks, benefits and other options for treatment, the patient has consented to  Procedure(s): EXCISION EXCESS SKIN RIGHT AXILLA, CHEST, AND LEFT AXILLA (N/A) as a surgical intervention.  The patient's history has been reviewed, patient examined, no change in status, stable for surgery.  I have reviewed the patient's chart and labs.  Questions were answered to the patient's satisfaction.     Shann Medal

## 2020-06-25 NOTE — Anesthesia Preprocedure Evaluation (Signed)
Anesthesia Evaluation  Patient identified by MRN, date of birth, ID band Patient awake    Reviewed: Allergy & Precautions, NPO status   Airway Mallampati: II  TM Distance: >3 FB     Dental   Pulmonary asthma , former smoker,    breath sounds clear to auscultation       Cardiovascular hypertension,  Rhythm:Regular Rate:Normal     Neuro/Psych  Headaches, Anxiety Depression  Neuromuscular disease    GI/Hepatic Neg liver ROS, GERD  ,  Endo/Other  diabetesHypothyroidism   Renal/GU      Musculoskeletal   Abdominal   Peds  Hematology  (+) anemia ,   Anesthesia Other Findings   Reproductive/Obstetrics                             Anesthesia Physical Anesthesia Plan  ASA: III  Anesthesia Plan: General   Post-op Pain Management:    Induction: Intravenous  PONV Risk Score and Plan: 3 and Ondansetron, Dexamethasone and Midazolam  Airway Management Planned: Oral ETT  Additional Equipment:   Intra-op Plan:   Post-operative Plan: Extubation in OR  Informed Consent: I have reviewed the patients History and Physical, chart, labs and discussed the procedure including the risks, benefits and alternatives for the proposed anesthesia with the patient or authorized representative who has indicated his/her understanding and acceptance.       Plan Discussed with: CRNA and Anesthesiologist  Anesthesia Plan Comments:         Anesthesia Quick Evaluation

## 2020-06-25 NOTE — Discharge Instructions (Signed)
[facility] [facility] - DISCHARGE INSTRUCTIONS TO PATIENT  Activity:  Driving - May drive tomorrow, if doing well and not taking pain meds   Lifting - No lifting more than 15 pounds for 10 days, then no limit                       Practice your Covid-19 protection:  Wear a mask, social distance, and wash your hands frequently  Wound Care:   Leave the incision dry for 2 days, then you may shower  Diet:  As tolerated  Follow up appointment:  Call Dr. Pollie Friar office [facility]) at [phone] for an appointment in 2 weeks.  Medications and dosages:  Resume your home medications.  You have a prescription for:  Vicodin             You may also take Tylenol, ibuprofen, or Aleve for pain  Call Dr. Lucia Gaskins or his office  [phone]) if you have:  Temperature greater than 100.4,  Persistent nausea and vomiting,  Severe uncontrolled pain,  Redness, tenderness, or signs of infection (pain, swelling, redness, odor or green/yellow discharge around the site),  Difficulty breathing, headache or visual disturbances,  Any other questions or concerns you may have after discharge.  In an emergency, call 911 or go to an Emergency Department at a nearby hospital.

## 2020-06-25 NOTE — Anesthesia Postprocedure Evaluation (Signed)
Anesthesia Post Note  Patient: Elizabeth Hawkins  Procedure(s) Performed: EXCISION EXCESS SKIN RIGHT AXILLA, CHEST, AND LEFT AXILLA (N/A Chest)     Patient location during evaluation: PACU Anesthesia Type: General Level of consciousness: awake Pain management: pain level controlled Vital Signs Assessment: post-procedure vital signs reviewed and stable Respiratory status: spontaneous breathing Cardiovascular status: stable Postop Assessment: no apparent nausea or vomiting Anesthetic complications: no   No complications documented.  Last Vitals:  Vitals:   06/25/20 1245 06/25/20 1300  BP: (!) 166/82 (!) 156/94  Pulse: 77 70  Resp: 11 16  Temp:    SpO2: 100% 100%    Last Pain:  Vitals:   06/25/20 1300  TempSrc:   PainSc: 8                  Brigitte Soderberg

## 2020-06-26 ENCOUNTER — Encounter (HOSPITAL_COMMUNITY): Payer: Self-pay | Admitting: Surgery

## 2020-06-29 LAB — SURGICAL PATHOLOGY

## 2020-07-14 DIAGNOSIS — H35033 Hypertensive retinopathy, bilateral: Secondary | ICD-10-CM | POA: Diagnosis not present

## 2020-07-14 DIAGNOSIS — H5213 Myopia, bilateral: Secondary | ICD-10-CM | POA: Diagnosis not present

## 2020-07-14 DIAGNOSIS — H04123 Dry eye syndrome of bilateral lacrimal glands: Secondary | ICD-10-CM | POA: Diagnosis not present

## 2020-07-14 DIAGNOSIS — H43813 Vitreous degeneration, bilateral: Secondary | ICD-10-CM | POA: Diagnosis not present

## 2020-07-14 DIAGNOSIS — E119 Type 2 diabetes mellitus without complications: Secondary | ICD-10-CM | POA: Diagnosis not present

## 2020-07-14 DIAGNOSIS — H2513 Age-related nuclear cataract, bilateral: Secondary | ICD-10-CM | POA: Diagnosis not present

## 2020-07-14 DIAGNOSIS — H52221 Regular astigmatism, right eye: Secondary | ICD-10-CM | POA: Diagnosis not present

## 2020-07-14 DIAGNOSIS — H524 Presbyopia: Secondary | ICD-10-CM | POA: Diagnosis not present

## 2020-07-22 ENCOUNTER — Encounter: Payer: Self-pay | Admitting: Internal Medicine

## 2020-08-12 MED ORDER — HYDROCHLOROTHIAZIDE 12.5 MG PO CAPS: 12.5000 mg | ORAL_CAPSULE | Freq: Every day | ORAL | 1 refills | Status: DC

## 2020-08-24 ENCOUNTER — Other Ambulatory Visit: Payer: Self-pay | Admitting: Internal Medicine

## 2020-08-24 NOTE — Telephone Encounter (Signed)
Please refill as per office routine med refill policy (all routine meds refilled for 3 mo or monthly per pt preference up to one year from last visit, then month to month grace period for 3 mo, then further med refills will have to be denied)  

## 2020-10-22 DIAGNOSIS — Z9884 Bariatric surgery status: Secondary | ICD-10-CM | POA: Diagnosis not present

## 2020-10-27 ENCOUNTER — Other Ambulatory Visit: Payer: Self-pay | Admitting: Surgery

## 2020-10-27 DIAGNOSIS — Z9884 Bariatric surgery status: Secondary | ICD-10-CM

## 2020-10-30 DIAGNOSIS — B379 Candidiasis, unspecified: Secondary | ICD-10-CM | POA: Diagnosis not present

## 2020-10-30 DIAGNOSIS — R300 Dysuria: Secondary | ICD-10-CM | POA: Diagnosis not present

## 2020-10-30 DIAGNOSIS — N390 Urinary tract infection, site not specified: Secondary | ICD-10-CM | POA: Diagnosis not present

## 2020-11-01 ENCOUNTER — Other Ambulatory Visit: Payer: Self-pay | Admitting: Internal Medicine

## 2020-11-05 ENCOUNTER — Other Ambulatory Visit: Payer: Self-pay | Admitting: Surgery

## 2020-11-05 ENCOUNTER — Ambulatory Visit
Admission: RE | Admit: 2020-11-05 | Discharge: 2020-11-05 | Disposition: A | Discharge status: Home / Self Care | Payer: Medicare Other | Source: Ambulatory Visit | Attending: Surgery | Admitting: Surgery

## 2020-11-05 DIAGNOSIS — Z9884 Bariatric surgery status: Secondary | ICD-10-CM | POA: Diagnosis not present

## 2020-11-07 ENCOUNTER — Other Ambulatory Visit: Payer: Self-pay | Admitting: Internal Medicine

## 2020-11-07 NOTE — Telephone Encounter (Signed)
Please refill as per office routine med refill policy (all routine meds refilled for 3 mo or monthly per pt preference up to one year from last visit, then month to month grace period for 3 mo, then further med refills will have to be denied)  

## 2020-12-19 ENCOUNTER — Other Ambulatory Visit: Payer: Self-pay | Admitting: Internal Medicine

## 2020-12-19 NOTE — Telephone Encounter (Signed)
Please refill as per office routine med refill policy (all routine meds refilled for 3 mo or monthly per pt preference up to one year from last visit, then month to month grace period for 3 mo, then further med refills will have to be denied)  

## 2020-12-21 ENCOUNTER — Telehealth: Payer: Self-pay | Admitting: Internal Medicine

## 2020-12-21 NOTE — Telephone Encounter (Signed)
1.Medication Requested: levothyroxine (SYNTHROID) 50 MCG tablet  telmisartan (MICARDIS) 80 MG tablet    2. Pharmacy (Name, [place]): [id number] - [place], [place]  3. On Med List: yes   4. Last Visit with PCP: 11-25-19  5. Next visit date with PCP: n/a    Agent: Please be advised that RX refills may take up to 3 business days. We ask that you follow-up with your pharmacy.

## 2020-12-22 ENCOUNTER — Other Ambulatory Visit: Payer: Self-pay | Admitting: Internal Medicine

## 2020-12-22 ENCOUNTER — Other Ambulatory Visit: Payer: Self-pay

## 2020-12-22 MED ORDER — LEVOTHYROXINE SODIUM 50 MCG PO TABS: ORAL_TABLET | ORAL | 14 refills | Status: DC

## 2020-12-22 NOTE — Telephone Encounter (Signed)
Please refill as per office routine med refill policy (all routine meds refilled for 3 mo or monthly per pt preference up to one year from last visit, then month to month grace period for 3 mo, then further med refills will have to be denied)  

## 2020-12-29 ENCOUNTER — Other Ambulatory Visit: Payer: Self-pay

## 2020-12-29 ENCOUNTER — Encounter: Payer: Self-pay | Admitting: Skilled Nursing Facility1

## 2020-12-29 ENCOUNTER — Encounter: Payer: Medicare Other | Attending: Surgery | Admitting: Skilled Nursing Facility1

## 2020-12-29 DIAGNOSIS — E119 Type 2 diabetes mellitus without complications: Secondary | ICD-10-CM | POA: Insufficient documentation

## 2020-12-29 NOTE — Progress Notes (Signed)
  Follow-up visit: Post-Operative RYGB Surgery  Primary concerns today: Post-operative Bariatric Surgery Nutrition Management.   Non-scale victories: being more active, feeling better about herself mentally; getting healthier with cholesterol, blood pressure, and diabetes    Pt states after healing from her breast cancer she has picked up weight and is struggling to lose it. Pt states she wants to get back in to eating right and losing weight.    Surgery date: 12/08/14 Surgery type: RYGB Start weight at [facility]: 262.5 lbs on 04/07/14 Weight today: pt declined Weight loss goals: improve health    24-hr recall: eating non-starchy veggies 5 times a week B (AM): premier protein and yogurt and coffee + sugar free creamer Snk (AM): blueberries + strawberries + splenda L (PM): sandwich or salad + rotisserie chicken + low calorie dressing + crunchy onions + tomatoes + olives Snk (PM): chips D (PM): chicken or fish or beef + greens or broccoli + corn Snk (PM): 8+oz wine   Fluid intake: water + flavoring, 24-33 oz Estimated total protein intake: ~80 g  Body Composition Scale 01/08/2019  Total Body Fat % 45  Visceral Fat 17  Fat-Free Mass % 54.9   Total Body Water % 41.9   Muscle-Mass lbs 29.3  Body Fat Displacement          Torso  lbs 63.5         Left Leg  lbs 12.7         Right Leg  lbs 12.7         Left Arm  lbs 6.3         Right Arm   lbs 6.3    Medications: see list Supplementation: vitamin C, vitamin D, vitamin B12, fiber supplement  Physical activity: ADL's  Using straws: No Drinking while eating: Not usually  Hair loss: No Carbonated beverages: No N/V/D/C: constipation getting better and taking benefiber every day; using miralax in coffee every morning  Dumping syndrome:  no  Progress Towards Goal(s):  In progress.    Nutritional Diagnosis:  [place]-3.3 Overweight/obesity related to past poor dietary habits and physical inactivity as evidenced by patient w/ recent RYGB  surgery following dietary guidelines for continued weight loss.    Intervention:  Nutrition education/diet reinforcement. Pt was educated and counseled on the ways to get back on track with taking appropriate bariatric multivitamins and calcium supplements. Pt educated on appropriate portion sizes and caloric intake wihtin in the context of continued blood glucose control. See goals. Pt was in agreement with goals listed.  Goals:  -Take 3 calcium all at least 2 hours apart  -Get back to taking the celebrate multivitamin + iron -Avoid eating out lunch: bring your lunch with you or get a salad but only have half the salad dressing and save some for a snack later; always include a non starchy vegetable with lunch -try skyr -Aim for 3/4 cup for your berries -Aim for 64 fluid ounces per day -try out support group: 3rd Thursday of every month at 6pm at [place], [employer]  -Aim for a minimum of 3 days a week walking 30 minutes at time   Teaching Method Utilized:  Visual Auditory Hands on  Barriers to learning/adherence to lifestyle change: none  Demonstrated degree of understanding via:  Teach Back   Monitoring/Evaluation:  Dietary intake, exercise, and body weight. Follow up in 6 weeks

## 2020-12-30 ENCOUNTER — Other Ambulatory Visit: Payer: Self-pay

## 2020-12-30 ENCOUNTER — Encounter: Payer: Self-pay | Admitting: Internal Medicine

## 2020-12-30 MED ORDER — METOPROLOL SUCCINATE ER 50 MG PO TB24: 50.0000 mg | ORAL_TABLET | Freq: Every day | ORAL | 0 refills | Status: DC

## 2021-01-01 ENCOUNTER — Ambulatory Visit: Payer: Medicare Other | Admitting: Internal Medicine

## 2021-01-05 ENCOUNTER — Ambulatory Visit (INDEPENDENT_AMBULATORY_CARE_PROVIDER_SITE_OTHER): Payer: Medicare Other | Admitting: Family Medicine

## 2021-01-08 ENCOUNTER — Other Ambulatory Visit: Payer: Self-pay

## 2021-01-08 ENCOUNTER — Ambulatory Visit (INDEPENDENT_AMBULATORY_CARE_PROVIDER_SITE_OTHER): Payer: Medicare Other | Admitting: Internal Medicine

## 2021-01-08 ENCOUNTER — Encounter: Payer: Self-pay | Admitting: Internal Medicine

## 2021-01-08 VITALS — BP 120/74 | HR 69 | Temp 98.7°F | Ht 64.0 in | Wt 228.6 lb

## 2021-01-08 DIAGNOSIS — E1165 Type 2 diabetes mellitus with hyperglycemia: Secondary | ICD-10-CM

## 2021-01-08 DIAGNOSIS — I10 Essential (primary) hypertension: Secondary | ICD-10-CM | POA: Diagnosis not present

## 2021-01-08 DIAGNOSIS — E538 Deficiency of other specified B group vitamins: Secondary | ICD-10-CM

## 2021-01-08 DIAGNOSIS — E559 Vitamin D deficiency, unspecified: Secondary | ICD-10-CM | POA: Diagnosis not present

## 2021-01-08 DIAGNOSIS — E7800 Pure hypercholesterolemia, unspecified: Secondary | ICD-10-CM | POA: Diagnosis not present

## 2021-01-08 DIAGNOSIS — E039 Hypothyroidism, unspecified: Secondary | ICD-10-CM | POA: Diagnosis not present

## 2021-01-08 DIAGNOSIS — L299 Pruritus, unspecified: Secondary | ICD-10-CM | POA: Diagnosis not present

## 2021-01-08 DIAGNOSIS — R252 Cramp and spasm: Secondary | ICD-10-CM | POA: Diagnosis not present

## 2021-01-08 MED ORDER — CYCLOBENZAPRINE HCL 5 MG PO TABS: 5.0000 mg | ORAL_TABLET | Freq: Three times a day (TID) | ORAL | 1 refills | Status: DC | PRN

## 2021-01-08 MED ORDER — ALPRAZOLAM 0.25 MG PO TABS: ORAL_TABLET | ORAL | 5 refills | Status: DC
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
## 2021-01-08 NOTE — Patient Instructions (Signed)
Please continue all other medications as before, and refills have been done if requested.  Please have the pharmacy call with any other refills you may need.  Please continue your efforts at being more active, low cholesterol diet, and weight control.  You are otherwise up to date with prevention measures today.  Please keep your appointments with your specialists as you may have planned  Please go to the LAB at the blood drawing area for the tests to be done - at the [facility] next week  You will be contacted by phone if any changes need to be made immediately.  Otherwise, you will receive a letter about your results with an explanation, but please check with MyChart first.  Please remember to sign up for MyChart if you have not done so, as this will be important to you in the future with finding out test results, communicating by private email, and scheduling acute appointments online when needed.  Please make an Appointment to return in 6 months, or sooner if needed

## 2021-01-09 ENCOUNTER — Encounter: Payer: Self-pay | Admitting: Internal Medicine

## 2021-01-09 DIAGNOSIS — R252 Cramp and spasm: Secondary | ICD-10-CM | POA: Insufficient documentation

## 2021-01-09 MED ORDER — TELMISARTAN 80 MG PO TABS: 80.0000 mg | ORAL_TABLET | Freq: Every day | ORAL | 3 refills | Status: DC

## 2021-01-09 MED ORDER — HYDROCHLOROTHIAZIDE 12.5 MG PO CAPS: 12.5000 mg | ORAL_CAPSULE | Freq: Every day | ORAL | 3 refills | Status: DC

## 2021-01-09 MED ORDER — AMLODIPINE BESYLATE 10 MG PO TABS: 1.0000 | ORAL_TABLET | Freq: Every day | ORAL | 3 refills | Status: DC

## 2021-01-09 MED ORDER — LEVOTHYROXINE SODIUM 50 MCG PO TABS: ORAL_TABLET | ORAL | 3 refills | Status: DC

## 2021-01-09 MED ORDER — METOPROLOL SUCCINATE ER 50 MG PO TB24: 50.0000 mg | ORAL_TABLET | Freq: Every day | ORAL | 3 refills | Status: DC

## 2021-01-09 NOTE — Assessment & Plan Note (Signed)
Lab Results  Component Value Date   HGBA1C 5.5 06/15/2020   Stable, pt to continue current medical treatment  - diet controlled

## 2021-01-09 NOTE — Assessment & Plan Note (Signed)
Most likely c/w allergy reaction, etiology unclear whether food or other - for allergy IGE testing

## 2021-01-09 NOTE — Assessment & Plan Note (Signed)
BP Readings from Last 3 Encounters:  01/08/21 120/74  06/25/20 (!) 141/83  06/15/20 129/63   Stable, pt to continue medical treatment norvasc, hct, toprol, micardis  Current Outpatient Medications (Endocrine & Metabolic):  .  levothyroxine (SYNTHROID) 50 MCG tablet, TAKE 1/2 TABLET BY MOUTH DAILY BEORE BREAKFAST  Current Outpatient Medications (Cardiovascular):  .  amLODipine (NORVASC) 10 MG tablet, TAKE 1 TABLET BY MOUTH  EVERY DAY .  hydrochlorothiazide (MICROZIDE) 12.5 MG capsule, Take 1 capsule (12.5 mg total) by mouth daily. .  metoprolol succinate (TOPROL-XL) 50 MG 24 hr tablet, Take 1 tablet (50 mg total) by mouth daily. Take with or immediately following a meal. .  telmisartan (MICARDIS) 80 MG tablet, TAKE 1 TABLET BY MOUTH EVERY DAY  Current Outpatient Medications (Respiratory):  .  albuterol (VENTOLIN HFA) 108 (90 Base) MCG/ACT inhaler, Inhale 2 puffs into the lungs every 6 (six) hours as needed for wheezing or shortness of breath.  Current Outpatient Medications (Analgesics):  .  acetaminophen (TYLENOL) 500 MG tablet, Take 1,000 mg by mouth every 8 (eight) hours as needed for headache.  Marland Kitchen  HYDROcodone-acetaminophen (NORCO/VICODIN) 5-325 MG tablet, Take 1 tablet by mouth every 6 (six) hours as needed for moderate pain.  Current Outpatient Medications (Hematological):  Marland Kitchen  Cyanocobalamin (B-12) 2500 MCG SUBL, TAKE 2,500 MCG BY MOUTH DAILY.  Current Outpatient Medications (Other):  Marland Kitchen  Ascorbic Acid (VITAMIN C) 1000 MG tablet, Take 1,000 mg by mouth daily. .  cholecalciferol (VITAMIN D) 1000 units tablet, Take 1,000 Units by mouth daily. .  cyclobenzaprine (FLEXERIL) 5 MG tablet, Take 1 tablet (5 mg total) by mouth 3 (three) times daily as needed for muscle spasms. Marland Kitchen  docusate sodium (COLACE) 100 MG capsule, Take 200 mg by mouth 2 (two) times daily as needed for mild constipation. .  polyethylene glycol powder (GLYCOLAX/MIRALAX) 17 GM/SCOOP powder, Take 17 g by mouth daily.  Marland Kitchen   ALPRAZolam (XANAX) 0.25 MG tablet, TAKE 1 TABLET BY MOUTH AT BEDTIME AS NEEDED FOR ANXIETY OR SLEEP .  linaclotide (LINZESS) 72 MCG capsule, Take 1 capsule (72 mcg total) by mouth daily before breakfast. (Patient taking differently: Take 72 mcg by mouth daily as needed (constipation.). ) .  Multiple Vitamins-Minerals (ZINC PO), Take 1 tablet by mouth daily.

## 2021-01-09 NOTE — Assessment & Plan Note (Signed)
Last vitamin D Lab Results  Component Value Date   VD25OH 30.81 10/28/2019   Low normal , to start oral replacement

## 2021-01-09 NOTE — Assessment & Plan Note (Signed)
Lab Results  Component Value Date   TSH 3.95 05/22/2019   Stable, pt to continue levothyroxine 50

## 2021-01-09 NOTE — Assessment & Plan Note (Signed)
For lytes with labs, ok for flexeril prn

## 2021-01-09 NOTE — Assessment & Plan Note (Signed)
Lab Results  Component Value Date   LDLCALC 77 10/28/2019   Stable, pt to continue current low chol diet, goal ldl < 70 - pt declines statin

## 2021-01-09 NOTE — Assessment & Plan Note (Signed)
Lab Results  Component Value Date   VITAMINB12 153 (L) 10/28/2019   Low, to start replacement - b12 1000 mcg qd

## 2021-01-19 ENCOUNTER — Ambulatory Visit (INDEPENDENT_AMBULATORY_CARE_PROVIDER_SITE_OTHER): Payer: Medicare Other | Admitting: Family Medicine

## 2021-01-21 ENCOUNTER — Encounter (INDEPENDENT_AMBULATORY_CARE_PROVIDER_SITE_OTHER): Payer: Self-pay

## 2021-01-21 ENCOUNTER — Ambulatory Visit (INDEPENDENT_AMBULATORY_CARE_PROVIDER_SITE_OTHER): Payer: Medicare Other | Admitting: Family Medicine

## 2021-01-21 ENCOUNTER — Other Ambulatory Visit (INDEPENDENT_AMBULATORY_CARE_PROVIDER_SITE_OTHER): Payer: Medicare Other

## 2021-01-21 DIAGNOSIS — E538 Deficiency of other specified B group vitamins: Secondary | ICD-10-CM

## 2021-01-21 DIAGNOSIS — E1165 Type 2 diabetes mellitus with hyperglycemia: Secondary | ICD-10-CM | POA: Diagnosis not present

## 2021-01-21 DIAGNOSIS — L299 Pruritus, unspecified: Secondary | ICD-10-CM | POA: Diagnosis not present

## 2021-01-21 DIAGNOSIS — E559 Vitamin D deficiency, unspecified: Secondary | ICD-10-CM | POA: Diagnosis not present

## 2021-01-21 LAB — BASIC METABOLIC PANEL
BUN: 18 mg/dL (ref 6–23)
CO2: 26 mEq/L (ref 19–32)
Calcium: 9.4 mg/dL (ref 8.4–10.5)
Chloride: 103 mEq/L (ref 96–112)
Creatinine, Ser: 1.01 mg/dL (ref 0.40–1.20)
GFR: 59.07 mL/min — ABNORMAL LOW (ref >60.00)
Glucose, Bld: 117 mg/dL — ABNORMAL HIGH (ref 70–99)
Potassium: 3.9 mEq/L (ref 3.5–5.1)
Sodium: 138 mEq/L (ref 135–145)

## 2021-01-21 LAB — CBC WITH DIFFERENTIAL/PLATELET
Basophils Absolute: 0 10*3/uL (ref 0.0–0.1)
Basophils Relative: 0.9 % (ref 0.0–3.0)
Eosinophils Absolute: 0.2 10*3/uL (ref 0.0–0.7)
Eosinophils Relative: 2.9 % (ref 0.0–5.0)
HCT: 35.6 % — ABNORMAL LOW (ref 36.0–46.0)
Hemoglobin: 11.2 g/dL — ABNORMAL LOW (ref 12.0–15.0)
Lymphocytes Relative: 39.4 % (ref 12.0–46.0)
Lymphs Abs: 2.1 10*3/uL (ref 0.7–4.0)
MCHC: 31.5 g/dL (ref 30.0–36.0)
MCV: 75.5 fl — ABNORMAL LOW (ref 78.0–100.0)
Monocytes Absolute: 0.6 10*3/uL (ref 0.1–1.0)
Monocytes Relative: 10.3 % (ref 3.0–12.0)
Neutro Abs: 2.5 10*3/uL (ref 1.4–7.7)
Neutrophils Relative %: 46.5 % (ref 43.0–77.0)
Platelets: 228 10*3/uL (ref 150.0–400.0)
RBC: 4.71 Mil/uL (ref 3.87–5.11)
RDW: 14.6 % (ref 11.5–15.5)
WBC: 5.4 10*3/uL (ref 4.0–10.5)

## 2021-01-21 LAB — LIPID PANEL
Cholesterol: 180 mg/dL (ref 0–200)
HDL: 57.9 mg/dL (ref >39.00)
LDL Cholesterol: 100 mg/dL — ABNORMAL HIGH (ref 0–99)
NonHDL: 122.18
Total CHOL/HDL Ratio: 3
Triglycerides: 113 mg/dL (ref 0.0–149.0)
VLDL: 22.6 mg/dL (ref 0.0–40.0)

## 2021-01-21 LAB — TSH: TSH: 5.43 u[IU]/mL — ABNORMAL HIGH (ref 0.35–4.50)

## 2021-01-21 LAB — HEPATIC FUNCTION PANEL
ALT: 14 U/L (ref 0–35)
AST: 16 U/L (ref 0–37)
Albumin: 4.1 g/dL (ref 3.5–5.2)
Alkaline Phosphatase: 47 U/L (ref 39–117)
Bilirubin, Direct: 0.1 mg/dL (ref 0.0–0.3)
Total Bilirubin: 0.4 mg/dL (ref 0.2–1.2)
Total Protein: 7.6 g/dL (ref 6.0–8.3)

## 2021-01-21 LAB — VITAMIN B12: Vitamin B-12: >1550 pg/mL — ABNORMAL HIGH (ref 211–911)

## 2021-01-21 LAB — VITAMIN D 25 HYDROXY (VIT D DEFICIENCY, FRACTURES): VITD: 30.68 ng/mL (ref 30.00–100.00)

## 2021-01-21 LAB — HEMOGLOBIN A1C: Hgb A1c MFr Bld: 6 % (ref 4.6–6.5)

## 2021-01-22 ENCOUNTER — Encounter: Payer: Self-pay | Admitting: Internal Medicine

## 2021-01-26 ENCOUNTER — Other Ambulatory Visit: Payer: Self-pay | Admitting: Internal Medicine

## 2021-01-26 ENCOUNTER — Other Ambulatory Visit (INDEPENDENT_AMBULATORY_CARE_PROVIDER_SITE_OTHER): Payer: Medicare Other

## 2021-01-26 DIAGNOSIS — E1165 Type 2 diabetes mellitus with hyperglycemia: Secondary | ICD-10-CM

## 2021-01-26 LAB — URINALYSIS, ROUTINE W REFLEX MICROSCOPIC
Bilirubin Urine: NEGATIVE
Hgb urine dipstick: NEGATIVE
Ketones, ur: NEGATIVE
Nitrite: NEGATIVE
RBC / HPF: NONE SEEN (ref 0–?)
Specific Gravity, Urine: 1.01 (ref 1.000–1.030)
Total Protein, Urine: NEGATIVE
Urine Glucose: NEGATIVE
Urobilinogen, UA: 0.2 (ref 0.0–1.0)
pH: 5.5 (ref 5.0–8.0)

## 2021-01-26 LAB — MICROALBUMIN / CREATININE URINE RATIO
Creatinine,U: 32.6 mg/dL
Microalb Creat Ratio: 2.2 mg/g (ref 0.0–30.0)
Microalb, Ur: <0.7 mg/dL (ref 0.0–1.9)

## 2021-01-26 NOTE — Telephone Encounter (Signed)
Please refill as per office routine med refill policy (all routine meds refilled for 3 mo or monthly per pt preference up to one year from last visit, then month to month grace period for 3 mo, then further med refills will have to be denied)  

## 2021-01-27 ENCOUNTER — Encounter: Payer: Self-pay | Admitting: Internal Medicine

## 2021-01-27 LAB — ALLERGEN PANEL (27) + IGE
Alternaria Alternata IgE: <0.1 kU/L
Aspergillus Fumigatus IgE: <0.1 kU/L
Bahia Grass IgE: <0.1 kU/L
Bermuda Grass IgE: <0.1 kU/L
Cat Dander IgE: <0.1 kU/L
Cedar, Mountain IgE: <0.1 kU/L
Cladosporium Herbarum IgE: <0.1 kU/L
Cocklebur IgE: <0.1 kU/L
Cockroach, American IgE: <0.1 kU/L
Common Silver Birch IgE: <0.1 kU/L
D Farinae IgE: 0.18 kU/L — AB
D Pteronyssinus IgE: <0.1 kU/L
Dog Dander IgE: <0.1 kU/L
Elm, American IgE: <0.1 kU/L
Hickory, White IgE: <0.1 kU/L
IgE (Immunoglobulin E), Serum: 110 IU/mL (ref 6–495)
Johnson Grass IgE: <0.1 kU/L
Kentucky Bluegrass IgE: <0.1 kU/L
Maple/Box Elder IgE: <0.1 kU/L
Mucor Racemosus IgE: <0.1 kU/L
Oak, White IgE: <0.1 kU/L
Penicillium Chrysogen IgE: <0.1 kU/L
Pigweed, Rough IgE: <0.1 kU/L
Plantain, English IgE: <0.1 kU/L
Ragweed, Short IgE: <0.1 kU/L
Setomelanomma Rostrat: <0.1 kU/L
Timothy Grass IgE: <0.1 kU/L
White Mulberry IgE: <0.1 kU/L

## 2021-02-03 ENCOUNTER — Ambulatory Visit (INDEPENDENT_AMBULATORY_CARE_PROVIDER_SITE_OTHER): Payer: Medicare Other | Admitting: Family Medicine

## 2021-02-16 ENCOUNTER — Ambulatory Visit: Payer: Medicare Other | Admitting: Skilled Nursing Facility1

## 2021-02-17 DIAGNOSIS — E559 Vitamin D deficiency, unspecified: Secondary | ICD-10-CM | POA: Diagnosis not present

## 2021-02-17 DIAGNOSIS — R0602 Shortness of breath: Secondary | ICD-10-CM | POA: Diagnosis not present

## 2021-02-17 DIAGNOSIS — R635 Abnormal weight gain: Secondary | ICD-10-CM | POA: Diagnosis not present

## 2021-02-17 DIAGNOSIS — E7800 Pure hypercholesterolemia, unspecified: Secondary | ICD-10-CM | POA: Diagnosis not present

## 2021-02-17 DIAGNOSIS — Z131 Encounter for screening for diabetes mellitus: Secondary | ICD-10-CM | POA: Diagnosis not present

## 2021-02-17 DIAGNOSIS — E8881 Metabolic syndrome: Secondary | ICD-10-CM | POA: Diagnosis not present

## 2021-02-17 DIAGNOSIS — Z79899 Other long term (current) drug therapy: Secondary | ICD-10-CM | POA: Diagnosis not present

## 2021-02-17 DIAGNOSIS — Z1331 Encounter for screening for depression: Secondary | ICD-10-CM | POA: Diagnosis not present

## 2021-03-04 DIAGNOSIS — Z780 Asymptomatic menopausal state: Secondary | ICD-10-CM | POA: Diagnosis not present

## 2021-03-18 DIAGNOSIS — R7302 Impaired glucose tolerance (oral): Secondary | ICD-10-CM | POA: Diagnosis not present

## 2021-03-18 DIAGNOSIS — Z6837 Body mass index (BMI) 37.0-37.9, adult: Secondary | ICD-10-CM | POA: Diagnosis not present

## 2021-03-18 DIAGNOSIS — E039 Hypothyroidism, unspecified: Secondary | ICD-10-CM | POA: Diagnosis not present

## 2021-03-18 DIAGNOSIS — E559 Vitamin D deficiency, unspecified: Secondary | ICD-10-CM | POA: Diagnosis not present

## 2021-03-18 DIAGNOSIS — E669 Obesity, unspecified: Secondary | ICD-10-CM | POA: Diagnosis not present

## 2021-03-23 ENCOUNTER — Encounter: Payer: Self-pay | Admitting: Internal Medicine

## 2021-04-13 DIAGNOSIS — E6609 Other obesity due to excess calories: Secondary | ICD-10-CM | POA: Diagnosis not present

## 2021-04-13 DIAGNOSIS — Z7689 Persons encountering health services in other specified circumstances: Secondary | ICD-10-CM | POA: Diagnosis not present

## 2021-04-13 DIAGNOSIS — E109 Type 1 diabetes mellitus without complications: Secondary | ICD-10-CM | POA: Diagnosis not present

## 2021-04-13 DIAGNOSIS — E663 Overweight: Secondary | ICD-10-CM | POA: Diagnosis not present

## 2021-04-16 DIAGNOSIS — Z6838 Body mass index (BMI) 38.0-38.9, adult: Secondary | ICD-10-CM | POA: Diagnosis not present

## 2021-04-16 DIAGNOSIS — E039 Hypothyroidism, unspecified: Secondary | ICD-10-CM | POA: Diagnosis not present

## 2021-04-16 DIAGNOSIS — I10 Essential (primary) hypertension: Secondary | ICD-10-CM | POA: Diagnosis not present

## 2021-04-16 DIAGNOSIS — E559 Vitamin D deficiency, unspecified: Secondary | ICD-10-CM | POA: Diagnosis not present

## 2021-04-16 DIAGNOSIS — E669 Obesity, unspecified: Secondary | ICD-10-CM | POA: Diagnosis not present

## 2021-04-20 ENCOUNTER — Other Ambulatory Visit: Payer: Self-pay | Admitting: Internal Medicine

## 2021-05-14 DIAGNOSIS — E669 Obesity, unspecified: Secondary | ICD-10-CM | POA: Diagnosis not present

## 2021-05-14 DIAGNOSIS — E559 Vitamin D deficiency, unspecified: Secondary | ICD-10-CM | POA: Diagnosis not present

## 2021-05-14 DIAGNOSIS — R7302 Impaired glucose tolerance (oral): Secondary | ICD-10-CM | POA: Diagnosis not present

## 2021-05-14 DIAGNOSIS — I10 Essential (primary) hypertension: Secondary | ICD-10-CM | POA: Diagnosis not present

## 2021-06-02 DIAGNOSIS — H43813 Vitreous degeneration, bilateral: Secondary | ICD-10-CM | POA: Diagnosis not present

## 2021-06-02 DIAGNOSIS — E119 Type 2 diabetes mellitus without complications: Secondary | ICD-10-CM | POA: Diagnosis not present

## 2021-06-02 DIAGNOSIS — H2513 Age-related nuclear cataract, bilateral: Secondary | ICD-10-CM | POA: Diagnosis not present

## 2021-06-02 DIAGNOSIS — H16223 Keratoconjunctivitis sicca, not specified as Sjogren's, bilateral: Secondary | ICD-10-CM | POA: Diagnosis not present

## 2021-06-16 ENCOUNTER — Telehealth: Payer: Self-pay | Admitting: Internal Medicine

## 2021-06-16 NOTE — Telephone Encounter (Signed)
Left message for patient to call back to schedule Medicare Annual Wellness Visit   Last AWV  11/13/19  Please schedule at anytime with [facility] if patient calls the office back.    40 Minutes appointment   Any questions, please call me at [phone]

## 2021-06-29 ENCOUNTER — Ambulatory Visit (INDEPENDENT_AMBULATORY_CARE_PROVIDER_SITE_OTHER): Payer: Medicare Other

## 2021-06-29 ENCOUNTER — Other Ambulatory Visit: Payer: Self-pay

## 2021-06-29 VITALS — BP 118/70 | HR 78 | Temp 98.2°F | Ht 64.0 in | Wt 214.0 lb

## 2021-06-29 DIAGNOSIS — Z0000 Encounter for general adult medical examination without abnormal findings: Secondary | ICD-10-CM
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
## 2021-06-29 NOTE — Patient Instructions (Signed)
Elizabeth Hawkins , Thank you for taking time to come for your Medicare Wellness Visit. I appreciate your ongoing commitment to your health goals. Please review the following plan we discussed and let me know if I can assist you in the future.   Screening recommendations/referrals: Colonoscopy: 03/20/2018; due every 5 years (due 03/2023) Mammogram: discontinued Bone Density: 10/22/2014 Recommended yearly ophthalmology/optometry visit for glaucoma screening and checkup Recommended yearly dental visit for hygiene and checkup  Vaccinations: Influenza vaccine: declined Pneumococcal vaccine: 10/04/2017, 10/23/2018 Tdap vaccine: 08/09/2016; due every 10 years Shingles vaccine: 04/22/2021, 06/21/2021  Covid-19: 10/16/2019, 11/06/2019, 12/27/2020, 05/29/2021  Advanced directives: Advance directive discussed with you today. I have provided a copy for you to complete at home and have notarized. Once this is complete please bring a copy in to our office so we can scan it into your chart.  Conditions/risks identified: Yes; Client understands the importance of follow-up with providers by attending scheduled visits and discussed goals to eat healthier, increase physical activity, exercise the brain, socialize more, get enough sleep and make time for laughter.  Next appointment: Please schedule your next Medicare Wellness Visit with your Nurse Health Advisor in 1 year by calling [phone].  Preventive Care 40-64 Years, Female Preventive care refers to lifestyle choices and visits with your health care provider that can promote health and wellness. What does preventive care include? A yearly physical exam. This is also called an annual well check. Dental exams once or twice a year. Routine eye exams. Ask your health care provider how often you should have your eyes checked. Personal lifestyle choices, including: Daily care of your teeth and gums. Regular physical activity. Eating a healthy diet. Avoiding tobacco  and drug use. Limiting alcohol use. Practicing safe sex. Taking low-dose aspirin daily starting at age 45. Taking vitamin and mineral supplements as recommended by your health care provider. What happens during an annual well check? The services and screenings done by your health care provider during your annual well check will depend on your age, overall health, lifestyle risk factors, and family history of disease. Counseling  Your health care provider may ask you questions about your: Alcohol use. Tobacco use. Drug use. Emotional well-being. Home and relationship well-being. Sexual activity. Eating habits. Work and work Statistician. Method of birth control. Menstrual cycle. Pregnancy history. Screening  You may have the following tests or measurements: Height, weight, and BMI. Blood pressure. Lipid and cholesterol levels. These may be checked every 5 years, or more frequently if you are over 75 years old. Skin check. Lung cancer screening. You may have this screening every year starting at age 62 if you have a 30-pack-year history of smoking and currently smoke or have quit within the past 15 years. Fecal occult blood test (FOBT) of the stool. You may have this test every year starting at age 28. Flexible sigmoidoscopy or colonoscopy. You may have a sigmoidoscopy every 5 years or a colonoscopy every 10 years starting at age 25. Hepatitis C blood test. Hepatitis B blood test. Sexually transmitted disease (STD) testing. Diabetes screening. This is done by checking your blood sugar (glucose) after you have not eaten for a while (fasting). You may have this done every 1-3 years. Mammogram. This may be done every 1-2 years. Talk to your health care provider about when you should start having regular mammograms. This may depend on whether you have a family history of breast cancer. BRCA-related cancer screening. This may be done if you have a family history of  breast, ovarian, tubal,  or peritoneal cancers. Pelvic exam and Pap test. This may be done every 3 years starting at age 29. Starting at age 57, this may be done every 5 years if you have a Pap test in combination with an HPV test. Bone density scan. This is done to screen for osteoporosis. You may have this scan if you are at high risk for osteoporosis. Discuss your test results, treatment options, and if necessary, the need for more tests with your health care provider. Vaccines  Your health care provider may recommend certain vaccines, such as: Influenza vaccine. This is recommended every year. Tetanus, diphtheria, and acellular pertussis (Tdap, Td) vaccine. You may need a Td booster every 10 years. Zoster vaccine. You may need this after age 8. Pneumococcal 13-valent conjugate (PCV13) vaccine. You may need this if you have certain conditions and were not previously vaccinated. Pneumococcal polysaccharide (PPSV23) vaccine. You may need one or two doses if you smoke cigarettes or if you have certain conditions. Talk to your health care provider about which screenings and vaccines you need and how often you need them. This information is not intended to replace advice given to you by your health care provider. Make sure you discuss any questions you have with your health care provider. Document Released: 08/21/2015 Document Revised: 04/13/2016 Document Reviewed: 05/26/2015 Elsevier Interactive Patient Education  2017 [place] Prevention in the Home Falls can cause injuries. They can happen to people of all ages. There are many things you can do to make your home safe and to help prevent falls. What can I do on the outside of my home? Regularly fix the edges of walkways and driveways and fix any cracks. Remove anything that might make you trip as you walk through a door, such as a raised step or threshold. Trim any bushes or trees on the path to your home. Use bright outdoor lighting. Clear any  walking paths of anything that might make someone trip, such as rocks or tools. Regularly check to see if handrails are loose or broken. Make sure that both sides of any steps have handrails. Any raised decks and porches should have guardrails on the edges. Have any leaves, snow, or ice cleared regularly. Use sand or salt on walking paths during winter. Clean up any spills in your garage right away. This includes oil or grease spills. What can I do in the bathroom? Use night lights. Install grab bars by the toilet and in the tub and shower. Do not use towel bars as grab bars. Use non-skid mats or decals in the tub or shower. If you need to sit down in the shower, use a plastic, non-slip stool. Keep the floor dry. Clean up any water that spills on the floor as soon as it happens. Remove soap buildup in the tub or shower regularly. Attach bath mats securely with double-sided non-slip rug tape. Do not have throw rugs and other things on the floor that can make you trip. What can I do in the bedroom? Use night lights. Make sure that you have a light by your bed that is easy to reach. Do not use any sheets or blankets that are too big for your bed. They should not hang down onto the floor. Have a firm chair that has side arms. You can use this for support while you get dressed. Do not have throw rugs and other things on the floor that can make you trip. What  can I do in the kitchen? Clean up any spills right away. Avoid walking on wet floors. Keep items that you use a lot in easy-to-reach places. If you need to reach something above you, use a strong step stool that has a grab bar. Keep electrical cords out of the way. Do not use floor polish or wax that makes floors slippery. If you must use wax, use non-skid floor wax. Do not have throw rugs and other things on the floor that can make you trip. What can I do with my stairs? Do not leave any items on the stairs. Make sure that there are  handrails on both sides of the stairs and use them. Fix handrails that are broken or loose. Make sure that handrails are as long as the stairways. Check any carpeting to make sure that it is firmly attached to the stairs. Fix any carpet that is loose or worn. Avoid having throw rugs at the top or bottom of the stairs. If you do have throw rugs, attach them to the floor with carpet tape. Make sure that you have a light switch at the top of the stairs and the bottom of the stairs. If you do not have them, ask someone to add them for you. What else can I do to help prevent falls? Wear shoes that: Do not have high heels. Have rubber bottoms. Are comfortable and fit you well. Are closed at the toe. Do not wear sandals. If you use a stepladder: Make sure that it is fully opened. Do not climb a closed stepladder. Make sure that both sides of the stepladder are locked into place. Ask someone to hold it for you, if possible. Clearly mark and make sure that you can see: Any grab bars or handrails. First and last steps. Where the edge of each step is. Use tools that help you move around (mobility aids) if they are needed. These include: Canes. Walkers. Scooters. Crutches. Turn on the lights when you go into a dark area. Replace any light bulbs as soon as they burn out. Set up your furniture so you have a clear path. Avoid moving your furniture around. If any of your floors are uneven, fix them. If there are any pets around you, be aware of where they are. Review your medicines with your doctor. Some medicines can make you feel dizzy. This can increase your chance of falling. Ask your doctor what other things that you can do to help prevent falls. This information is not intended to replace advice given to you by your health care provider. Make sure you discuss any questions you have with your health care provider. Document Released: 05/21/2009 Document Revised: 12/31/2015 Document Reviewed:  08/29/2014 Elsevier Interactive Patient Education  2017 [employer].

## 2021-07-07 DIAGNOSIS — Z0000 Encounter for general adult medical examination without abnormal findings: Secondary | ICD-10-CM | POA: Diagnosis not present

## 2021-07-07 DIAGNOSIS — R7302 Impaired glucose tolerance (oral): Secondary | ICD-10-CM | POA: Diagnosis not present

## 2021-07-07 DIAGNOSIS — E669 Obesity, unspecified: Secondary | ICD-10-CM | POA: Diagnosis not present

## 2021-07-07 DIAGNOSIS — E039 Hypothyroidism, unspecified: Secondary | ICD-10-CM | POA: Diagnosis not present

## 2021-07-07 DIAGNOSIS — Z79899 Other long term (current) drug therapy: Secondary | ICD-10-CM | POA: Diagnosis not present

## 2021-07-07 DIAGNOSIS — E559 Vitamin D deficiency, unspecified: Secondary | ICD-10-CM | POA: Diagnosis not present

## 2021-07-16 ENCOUNTER — Encounter: Payer: Self-pay | Admitting: Internal Medicine

## 2021-07-16 ENCOUNTER — Ambulatory Visit (INDEPENDENT_AMBULATORY_CARE_PROVIDER_SITE_OTHER): Payer: Medicare Other | Admitting: Internal Medicine

## 2021-07-16 ENCOUNTER — Other Ambulatory Visit: Payer: Self-pay

## 2021-07-16 VITALS — BP 118/66 | HR 68 | Temp 98.9°F | Ht 64.0 in | Wt 218.0 lb

## 2021-07-16 DIAGNOSIS — I10 Essential (primary) hypertension: Secondary | ICD-10-CM | POA: Diagnosis not present

## 2021-07-16 DIAGNOSIS — R051 Acute cough: Secondary | ICD-10-CM

## 2021-07-16 DIAGNOSIS — R062 Wheezing: Secondary | ICD-10-CM

## 2021-07-16 DIAGNOSIS — R079 Chest pain, unspecified: Secondary | ICD-10-CM | POA: Diagnosis not present

## 2021-07-16 MED ORDER — HYDROCODONE BIT-HOMATROP MBR 5-1.5 MG/5ML PO SOLN: 5.0000 mL | Freq: Four times a day (QID) | ORAL | 0 refills | Status: AC | PRN

## 2021-07-16 MED ORDER — ALBUTEROL SULFATE HFA 108 (90 BASE) MCG/ACT IN AERS: 2.0000 | INHALATION_SPRAY | Freq: Four times a day (QID) | RESPIRATORY_TRACT | 2 refills | Status: DC | PRN

## 2021-07-16 MED ORDER — DOXYCYCLINE HYCLATE 100 MG PO TABS: 100.0000 mg | ORAL_TABLET | Freq: Two times a day (BID) | ORAL | 0 refills | Status: DC

## 2021-07-16 MED ORDER — PREDNISONE 10 MG PO TABS: ORAL_TABLET | ORAL | 0 refills | Status: DC

## 2021-07-16 MED ORDER — FLUCONAZOLE 150 MG PO TABS: ORAL_TABLET | ORAL | 1 refills | Status: AC
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
## 2021-07-16 NOTE — Patient Instructions (Signed)
Your EKG was OK today  Please take all new medication as prescribed - the antibiotic, cough medicine, prednisone, diflucan and inhaler as needed  Please continue all other medications as before, and refills have been done if requested.  Please have the pharmacy call with any other refills you may need.  Please continue your efforts at being more active, low cholesterol diet, and weight control.  Please keep your appointments with your specialists as you may have planned

## 2021-07-17 ENCOUNTER — Encounter: Payer: Self-pay | Admitting: Internal Medicine

## 2021-07-17 DIAGNOSIS — R059 Cough, unspecified: Secondary | ICD-10-CM | POA: Insufficient documentation

## 2021-07-17 DIAGNOSIS — R062 Wheezing: Secondary | ICD-10-CM | POA: Insufficient documentation

## 2021-07-17 NOTE — Assessment & Plan Note (Signed)
Mild to mod, for predpac asd, inhaler prn,,  to f/u any worsening symptoms or concerns 

## 2021-07-17 NOTE — Assessment & Plan Note (Signed)
Atypical, pleuritic, ECG reviewed, very low suspicion for cardiac,  to f/u any worsening symptoms or concerns

## 2021-07-17 NOTE — Assessment & Plan Note (Signed)
BP Readings from Last 3 Encounters:  07/16/21 118/66  06/29/21 118/70  01/08/21 120/74   Stable, pt to continue medical treatment norvasc, hct, toprol

## 2021-07-17 NOTE — Assessment & Plan Note (Signed)
Mild to mod, c/w bronchitis vs pna, declienes cxr, for antibx course with diflucan, cough med prn, to f/u any worsening symptoms or concerns

## 2021-07-19 NOTE — Progress Notes (Signed)
Benito Mccreedy D.[place] Phone: [phone]   Assessment and Plan:     1. Right knee pain, unspecified chronicity 4. Osteoarthritis of right knee, unspecified osteoarthritis type -Chronic with exacerbation, initial visit - X-ray obtained in clinic.  Monitor rotation: Moderate tricompartmental osteoarthritis with cortical irregularities, bone cyst, decreased joint space.  No acute fracture or dislocation - Patient elected for CSI intra-articular knee.  Tolerated well per note below - May use Tylenol for day-to-day pain relief.  Patient states she cannot take NSAIDs - DG Knee AP/LAT W/Sunrise Right; Future - DG Knee AP/LAT W/Sunrise Left; Future   Procedure: Knee Joint Injection Side: Right Indication: Acute flare of osteoarthritis  Risks explained and consent was given verbally. The site was cleaned with alcohol prep. A needle was introduced with an anterio-lateral approach. Injection given using 14mL of 1% lidocaine without epinephrine and 22mL of kenalog 40mg /ml. This was well tolerated and resulted in symptomatic relief.  Needle was removed, hemostasis achieved, and post injection instructions were explained.   Pt was advised to call or return to clinic if these symptoms worsen or fail to improve as anticipated.    2. Left knee pain, unspecified chronicity 3. Osteoarthritis of left knee, unspecified osteoarthritis type -Chronic, initial visit - Minimal to no pain on left knee.  We will continue to follow and could consider CSI in the future - X-ray obtained in clinic.  Monitor rotation: Moderate tricompartmental osteoarthritis with cortical irregularities, bone cyst, decreased joint space.  No acute fracture or dislocation     Pertinent previous records reviewed include none   Follow Up: 3 to 4 weeks for reevaluation.  Could consider HA injection versus HEP versus PT versus ultrasound at that time if no  improvement or worsening of symptoms   Subjective:    I, [name], am serving as a Education administrator for Doctor Glennon Mac  Chief Complaint: knee pain   HPI:   07/20/2021  Patient is a 65 year old female complaining of knee pain. Patient states that has had surgery (meniscus 2008)  on R knee can palpate pain spot  medial aspect of knee has it on left knee as well but more painful on R feels like a clicking has been using tylenol and topical cream and it is not working. No numbness and tingling. Walking in general aggravates  Radiates: no   Relevant Historical Information: Hypertension, DM type II  Additional pertinent review of systems negative.   Current Outpatient Medications:    acetaminophen (TYLENOL) 500 MG tablet, Take 1,000 mg by mouth every 8 (eight) hours as needed for headache. , Disp: , Rfl:    albuterol (VENTOLIN HFA) 108 (90 Base) MCG/ACT inhaler, Inhale 2 puffs into the lungs every 6 (six) hours as needed for wheezing or shortness of breath., Disp: 1 each, Rfl: 2   ALPRAZolam (XANAX) 0.25 MG tablet, TAKE 1 TABLET BY MOUTH AT BEDTIME AS NEEDED FOR ANXIETY OR SLEEP, Disp: 30 tablet, Rfl: 5   amLODipine (NORVASC) 10 MG tablet, Take 1 tablet (10 mg total) by mouth daily., Disp: 90 tablet, Rfl: 3   Ascorbic Acid (VITAMIN C) 1000 MG tablet, Take 1,000 mg by mouth daily., Disp: , Rfl:    cholecalciferol (VITAMIN D) 1000 units tablet, Take 1,000 Units by mouth daily., Disp: , Rfl:    Cyanocobalamin (B-12) 2500 MCG SUBL, TAKE 2,500 MCG BY MOUTH DAILY., Disp: 30 tablet, Rfl: 32   cyclobenzaprine (FLEXERIL) 5  MG tablet, Take 1 tablet (5 mg total) by mouth 3 (three) times daily as needed for muscle spasms., Disp: 90 tablet, Rfl: 1   cycloSPORINE (RESTASIS) 0.05 % ophthalmic emulsion, Place 1 drop into both eyes 2 (two) times daily., Disp: , Rfl:    docusate sodium (COLACE) 100 MG capsule, Take 200 mg by mouth 2 (two) times daily as needed for mild constipation., Disp: , Rfl:     doxycycline (VIBRA-TABS) 100 MG tablet, Take 1 tablet (100 mg total) by mouth 2 (two) times daily., Disp: 20 tablet, Rfl: 0   EYSUVIS 0.25 % SUSP, Apply 1 drop to eye 2 (two) times daily., Disp: , Rfl:    fluconazole (DIFLUCAN) 150 MG tablet, 1 tab by mouth every 3 days as needed, Disp: 2 tablet, Rfl: 1   hydrochlorothiazide (MICROZIDE) 12.5 MG capsule, TAKE 1 CAPSULE BY MOUTH EVERY DAY, Disp: 90 capsule, Rfl: 2   HYDROcodone bit-homatropine (HYCODAN) 5-1.5 MG/5ML syrup, Take 5 mLs by mouth every 6 (six) hours as needed for up to 10 days., Disp: 180 mL, Rfl: 0   levothyroxine (SYNTHROID) 50 MCG tablet, TAKE 1/2 TABLET BY MOUTH DAILY BEORE BREAKFAST, Disp: 45 tablet, Rfl: 3   metoprolol succinate (TOPROL-XL) 50 MG 24 hr tablet, Take 1 tablet (50 mg total) by mouth daily. Take with or immediately following a meal., Disp: 90 tablet, Rfl: 3   polyethylene glycol powder (GLYCOLAX/MIRALAX) 17 GM/SCOOP powder, Take 17 g by mouth daily. , Disp: , Rfl:    predniSONE (DELTASONE) 10 MG tablet, 3 tabs by mouth per day for 3 days,2tabs per day for 3 days,1tab per day for 3 days, Disp: 18 tablet, Rfl: 0   RYBELSUS 14 MG TABS, Take 1 tablet by mouth daily., Disp: , Rfl:    telmisartan (MICARDIS) 80 MG tablet, Take 1 tablet (80 mg total) by mouth daily., Disp: 90 tablet, Rfl: 3   TYRVAYA 0.03 MG/ACT SOLN, Place 1 spray into both nostrils 2 (two) times daily., Disp: , Rfl:    Objective:     Vitals:   07/20/21 1002  BP: 122/80  Pulse: 81  SpO2: 98%  Weight: 218 lb (98.9 kg)  Height: 5\' 4"  (1.626 m)      Body mass index is 37.42 kg/m.    Physical Exam:    General:  awake, alert oriented, no acute distress nontoxic Skin: no suspicious lesions or rashes Neuro:sensation intact, no deficits, strength 5/5 with no deficits, no atrophy, normal muscle tone Psych: No signs of anxiety, depression or other mood disorder  Bilateral knee: No swelling No deformity Neg fluid wave, joint milking ROM Flex 100, Ext  5 TTP mildly to medial joint line on right NTTP over the quad tendon, medial fem condyle, lat fem condyle, patella, plica, patella tendon, tibial tuberostiy, fibular head, posterior fossa, pes anserine bursa, gerdy's tubercle, medial jt line on left, lateral jt line Neg anterior and posterior drawer Neg lachman Neg sag sign Negative varus stress   Negative valgus stress on right, positive valgus stress test on left without pain Negative McMurray Negative Thessaly  Gait normal    Electronically signed by:  Benito Mccreedy D.[facility] 10:43 AM 07/20/21

## 2021-07-20 ENCOUNTER — Ambulatory Visit (INDEPENDENT_AMBULATORY_CARE_PROVIDER_SITE_OTHER): Payer: Medicare Other | Admitting: Sports Medicine

## 2021-07-20 ENCOUNTER — Ambulatory Visit (INDEPENDENT_AMBULATORY_CARE_PROVIDER_SITE_OTHER): Payer: Medicare Other

## 2021-07-20 ENCOUNTER — Other Ambulatory Visit: Payer: Self-pay

## 2021-07-20 VITALS — BP 122/80 | HR 81 | Ht 64.0 in | Wt 218.0 lb

## 2021-07-20 DIAGNOSIS — M25561 Pain in right knee: Secondary | ICD-10-CM | POA: Diagnosis not present

## 2021-07-20 DIAGNOSIS — M25562 Pain in left knee: Secondary | ICD-10-CM

## 2021-07-20 DIAGNOSIS — M1711 Unilateral primary osteoarthritis, right knee: Secondary | ICD-10-CM | POA: Diagnosis not present

## 2021-07-20 DIAGNOSIS — M1712 Unilateral primary osteoarthritis, left knee: Secondary | ICD-10-CM | POA: Diagnosis not present

## 2021-07-20 NOTE — Patient Instructions (Addendum)
Good to see you  3-4 week follow up  

## 2021-07-22 ENCOUNTER — Encounter: Payer: Self-pay | Admitting: Sports Medicine

## 2021-07-26 ENCOUNTER — Ambulatory Visit (INDEPENDENT_AMBULATORY_CARE_PROVIDER_SITE_OTHER): Payer: Medicare Other | Admitting: Sports Medicine

## 2021-07-26 ENCOUNTER — Other Ambulatory Visit: Payer: Self-pay

## 2021-07-26 VITALS — BP 118/78 | HR 78 | Ht 64.0 in | Wt 224.0 lb

## 2021-07-26 DIAGNOSIS — M1711 Unilateral primary osteoarthritis, right knee: Secondary | ICD-10-CM | POA: Diagnosis not present

## 2021-07-26 DIAGNOSIS — M1712 Unilateral primary osteoarthritis, left knee: Secondary | ICD-10-CM

## 2021-07-26 DIAGNOSIS — M25561 Pain in right knee: Secondary | ICD-10-CM | POA: Diagnosis not present

## 2021-07-26 DIAGNOSIS — M25562 Pain in left knee: Secondary | ICD-10-CM | POA: Diagnosis not present

## 2021-07-26 NOTE — Progress Notes (Signed)
Elizabeth Hawkins D.[place] Phone: [phone]   Assessment and Plan:     1. Left knee pain, unspecified chronicity 2. Osteoarthritis of left knee, unspecified osteoarthritis type -Chronic with exacerbation, subsequent visit - Mild worsening of generalized left knee pain since previous office visit.  Likely an acute flare of chronic osteoarthritis - As patient had complete resolution of pain with CSI to right knee, patient requests additional CSI today.  Tolerated well per note below.  Procedure: Knee Joint Injection Side: Left Indication: Acute pain of left knee with osteoarthritis  Risks explained and consent was given verbally. The site was cleaned with alcohol prep. A needle was introduced with an anterio-lateral approach. Injection given using 68mL of 1% lidocaine without epinephrine and 68mL of kenalog 40mg /ml. This was well tolerated and resulted in symptomatic relief.  Needle was removed, hemostasis achieved, and post injection instructions were explained.   Pt was advised to call or return to clinic if these symptoms worsen or fail to improve as anticipated.   3. Right knee pain, unspecified chronicity 4. Osteoarthritis of right knee, unspecified osteoarthritis type -Resolved pain, subsequent visit - Completely resolved pain after CSI at previous office visit   Pertinent previous records reviewed include knee x-rays left, right from 07/21/2021   Follow Up: As needed if no improvement or worsening of symptoms   Subjective:   I, Elizabeth Hawkins, am serving as a Education administrator for Doctor [employer]  Chief Complaint: Left knee pain   HPI:   07/20/2021  Patient is a 65 year old female complaining of knee pain. Patient states that has had surgery (meniscus 2008)  on R knee can palpate pain spot  medial aspect of knee has it on left knee as well but more painful on R feels like a clicking has been using  tylenol and topical cream and it is not working. No numbness and tingling. Walking in general aggravates  Radiates: no     Relevant Historical Information: Hypertension, DM type II  07/26/21 Patient states that the right nee is fine, today is the left knee and she is wanting an injection. Pain is like a band around the Left knee, using tylenol and topical creams to help, has mechanical symptoms. Patient states the injection in her right knee helped almost immediately.    Relevant Historical Information: Hypertension, hypothyroidism, DM type II  Additional pertinent review of systems negative.   Current Outpatient Medications:    acetaminophen (TYLENOL) 500 MG tablet, Take 1,000 mg by mouth every 8 (eight) hours as needed for headache. , Disp: , Rfl:    albuterol (VENTOLIN HFA) 108 (90 Base) MCG/ACT inhaler, Inhale 2 puffs into the lungs every 6 (six) hours as needed for wheezing or shortness of breath., Disp: 1 each, Rfl: 2   ALPRAZolam (XANAX) 0.25 MG tablet, TAKE 1 TABLET BY MOUTH AT BEDTIME AS NEEDED FOR ANXIETY OR SLEEP, Disp: 30 tablet, Rfl: 5   amLODipine (NORVASC) 10 MG tablet, Take 1 tablet (10 mg total) by mouth daily., Disp: 90 tablet, Rfl: 3   Ascorbic Acid (VITAMIN C) 1000 MG tablet, Take 1,000 mg by mouth daily., Disp: , Rfl:    cholecalciferol (VITAMIN D) 1000 units tablet, Take 1,000 Units by mouth daily., Disp: , Rfl:    Cyanocobalamin (B-12) 2500 MCG SUBL, TAKE 2,500 MCG BY MOUTH DAILY., Disp: 30 tablet, Rfl: 32   cyclobenzaprine (FLEXERIL) 5 MG tablet, Take 1 tablet (5 mg total)  by mouth 3 (three) times daily as needed for muscle spasms., Disp: 90 tablet, Rfl: 1   cycloSPORINE (RESTASIS) 0.05 % ophthalmic emulsion, Place 1 drop into both eyes 2 (two) times daily., Disp: , Rfl:    docusate sodium (COLACE) 100 MG capsule, Take 200 mg by mouth 2 (two) times daily as needed for mild constipation., Disp: , Rfl:    doxycycline (VIBRA-TABS) 100 MG tablet, Take 1 tablet (100 mg  total) by mouth 2 (two) times daily., Disp: 20 tablet, Rfl: 0   EYSUVIS 0.25 % SUSP, Apply 1 drop to eye 2 (two) times daily., Disp: , Rfl:    fluconazole (DIFLUCAN) 150 MG tablet, 1 tab by mouth every 3 days as needed, Disp: 2 tablet, Rfl: 1   hydrochlorothiazide (MICROZIDE) 12.5 MG capsule, TAKE 1 CAPSULE BY MOUTH EVERY DAY, Disp: 90 capsule, Rfl: 2   HYDROcodone bit-homatropine (HYCODAN) 5-1.5 MG/5ML syrup, Take 5 mLs by mouth every 6 (six) hours as needed for up to 10 days., Disp: 180 mL, Rfl: 0   levothyroxine (SYNTHROID) 50 MCG tablet, TAKE 1/2 TABLET BY MOUTH DAILY BEORE BREAKFAST, Disp: 45 tablet, Rfl: 3   metoprolol succinate (TOPROL-XL) 50 MG 24 hr tablet, Take 1 tablet (50 mg total) by mouth daily. Take with or immediately following a meal., Disp: 90 tablet, Rfl: 3   polyethylene glycol powder (GLYCOLAX/MIRALAX) 17 GM/SCOOP powder, Take 17 g by mouth daily. , Disp: , Rfl:    predniSONE (DELTASONE) 10 MG tablet, 3 tabs by mouth per day for 3 days,2tabs per day for 3 days,1tab per day for 3 days, Disp: 18 tablet, Rfl: 0   RYBELSUS 14 MG TABS, Take 1 tablet by mouth daily., Disp: , Rfl:    telmisartan (MICARDIS) 80 MG tablet, Take 1 tablet (80 mg total) by mouth daily., Disp: 90 tablet, Rfl: 3   TYRVAYA 0.03 MG/ACT SOLN, Place 1 spray into both nostrils 2 (two) times daily., Disp: , Rfl:    Objective:     Vitals:   07/26/21 1103  BP: 118/78  Pulse: 78  SpO2: 98%  Weight: 224 lb (101.6 kg)  Height: 5\' 4"  (1.626 m)      Body mass index is 38.45 kg/m.    Physical Exam:    General:  awake, alert oriented, no acute distress nontoxic Skin: no suspicious lesions or rashes Neuro:sensation intact, no deficits, strength 5/5 with no deficits, no atrophy, normal muscle tone Psych: No signs of anxiety, depression or other mood disorder  Left knee: No swelling No deformity Neg fluid wave, joint milking ROM Flex 110, Ext 5 TTP medial joint line, medial femoral condyle NTTP over the  quad tendon,  lat fem condyle, patella, plica, patella tendon, tibial tuberostiy, fibular head, posterior fossa, pes anserine bursa, gerdy's tubercle,   lateral jt line Neg anterior and posterior drawer Neg lachman Neg sag sign Negative varus stress Negative valgus stress Negative McMurray Positive Thessaly  Gait normal    Electronically signed by:  Elizabeth Hawkins D.[facility] 11:21 AM 07/26/21

## 2021-07-26 NOTE — Patient Instructions (Addendum)
Good to see you  ?As needed follow up  ?

## 2021-07-30 ENCOUNTER — Encounter: Payer: Self-pay | Admitting: Internal Medicine

## 2021-07-30 MED ORDER — CYCLOBENZAPRINE HCL 5 MG PO TABS: 5.0000 mg | ORAL_TABLET | Freq: Three times a day (TID) | ORAL | 1 refills | Status: DC | PRN

## 2021-08-03 DIAGNOSIS — E669 Obesity, unspecified: Secondary | ICD-10-CM | POA: Diagnosis not present

## 2021-08-03 DIAGNOSIS — E039 Hypothyroidism, unspecified: Secondary | ICD-10-CM | POA: Diagnosis not present

## 2021-08-03 DIAGNOSIS — Z6837 Body mass index (BMI) 37.0-37.9, adult: Secondary | ICD-10-CM | POA: Diagnosis not present

## 2021-08-03 DIAGNOSIS — R7302 Impaired glucose tolerance (oral): Secondary | ICD-10-CM | POA: Diagnosis not present

## 2021-08-03 DIAGNOSIS — I10 Essential (primary) hypertension: Secondary | ICD-10-CM | POA: Diagnosis not present

## 2021-08-13 ENCOUNTER — Ambulatory Visit: Payer: Medicare Other | Admitting: Sports Medicine

## 2021-08-18 ENCOUNTER — Other Ambulatory Visit: Payer: Self-pay | Admitting: Internal Medicine

## 2021-08-24 ENCOUNTER — Encounter: Payer: Self-pay | Admitting: Internal Medicine

## 2021-08-27 ENCOUNTER — Other Ambulatory Visit: Payer: Self-pay | Admitting: Surgery

## 2021-08-27 DIAGNOSIS — R109 Unspecified abdominal pain: Secondary | ICD-10-CM

## 2021-09-21 ENCOUNTER — Other Ambulatory Visit: Payer: Medicare Other

## 2021-10-06 ENCOUNTER — Other Ambulatory Visit: Payer: Self-pay

## 2021-10-06 ENCOUNTER — Ambulatory Visit
Admission: RE | Admit: 2021-10-06 | Discharge: 2021-10-06 | Disposition: A | Discharge status: Home / Self Care | Payer: Medicare Other | Source: Ambulatory Visit | Attending: Surgery | Admitting: Surgery

## 2021-10-06 DIAGNOSIS — R109 Unspecified abdominal pain: Secondary | ICD-10-CM

## 2021-10-06 MED ORDER — IOPAMIDOL (ISOVUE-300) INJECTION 61%
100.0000 mL | Freq: Once | INTRAVENOUS | Status: AC | PRN
  Administered 2021-10-06: 100 mL via INTRAVENOUS

## 2021-10-18 ENCOUNTER — Encounter: Payer: Self-pay | Admitting: Internal Medicine

## 2021-10-18 NOTE — Progress Notes (Unsigned)
Subjective:    Patient ID: MELIZZA KANODE, female    DOB: 1956-04-14, 66 y.o.   MRN: [id number]  This visit occurred during the SARS-CoV-2 public health emergency.  Safety protocols were in place, including screening questions prior to the visit, additional usage of staff PPE, and extensive cleaning of exam room while observing appropriate contact time as indicated for disinfecting solutions.    HPI Davionne is here for No chief complaint on file.        Medications and allergies reviewed with patient and updated if appropriate.  Current Outpatient Medications on File Prior to Visit  Medication Sig Dispense Refill   acetaminophen (TYLENOL) 500 MG tablet Take 1,000 mg by mouth every 8 (eight) hours as needed for headache.      albuterol (VENTOLIN HFA) 108 (90 Base) MCG/ACT inhaler Inhale 2 puffs into the lungs every 6 (six) hours as needed for wheezing or shortness of breath. 1 each 2   ALPRAZolam (XANAX) 0.25 MG tablet TAKE 1 TABLET BY MOUTH AT BEDTIME AS NEEDED FOR ANXIETY OR SLEEP 30 tablet 5   amLODipine (NORVASC) 10 MG tablet Take 1 tablet (10 mg total) by mouth daily. 90 tablet 3   Ascorbic Acid (VITAMIN C) 1000 MG tablet Take 1,000 mg by mouth daily.     cholecalciferol (VITAMIN D) 1000 units tablet Take 1,000 Units by mouth daily.     Cyanocobalamin (B-12) 2500 MCG SUBL TAKE 2,500 MCG BY MOUTH DAILY. 30 tablet 32   cyclobenzaprine (FLEXERIL) 5 MG tablet Take 1 tablet (5 mg total) by mouth 3 (three) times daily as needed for muscle spasms. 90 tablet 1   cycloSPORINE (RESTASIS) 0.05 % ophthalmic emulsion Place 1 drop into both eyes 2 (two) times daily.     docusate sodium (COLACE) 100 MG capsule Take 200 mg by mouth 2 (two) times daily as needed for mild constipation.     doxycycline (VIBRA-TABS) 100 MG tablet Take 1 tablet (100 mg total) by mouth 2 (two) times daily. 20 tablet 0   EYSUVIS 0.25 % SUSP Apply 1 drop to eye 2 (two) times daily.     fluconazole (DIFLUCAN) 150 MG  tablet 1 tab by mouth every 3 days as needed 2 tablet 1   hydrochlorothiazide (MICROZIDE) 12.5 MG capsule TAKE 1 CAPSULE BY MOUTH EVERY DAY 90 capsule 2   levothyroxine (SYNTHROID) 50 MCG tablet TAKE 1/2 TABLET BY MOUTH DAILY BEORE BREAKFAST 45 tablet 3   metoprolol succinate (TOPROL-XL) 50 MG 24 hr tablet Take 1 tablet (50 mg total) by mouth daily. Take with or immediately following a meal. 90 tablet 3   polyethylene glycol powder (GLYCOLAX/MIRALAX) 17 GM/SCOOP powder Take 17 g by mouth daily.      predniSONE (DELTASONE) 10 MG tablet 3 tabs by mouth per day for 3 days,2tabs per day for 3 days,1tab per day for 3 days 18 tablet 0   RYBELSUS 14 MG TABS Take 1 tablet by mouth daily.     telmisartan (MICARDIS) 80 MG tablet Take 1 tablet (80 mg total) by mouth daily. 90 tablet 3   TYRVAYA 0.03 MG/ACT SOLN Place 1 spray into both nostrils 2 (two) times daily.     [DISCONTINUED] potassium chloride (K-DUR) 10 MEQ tablet Take 1 tablet (10 mEq total) by mouth daily. 90 tablet 3   No current facility-administered medications on file prior to visit.    Review of Systems     Objective:  There were no vitals filed for this visit.  BP Readings from Last 3 Encounters:  07/26/21 118/78  07/20/21 122/80  07/16/21 118/66   Wt Readings from Last 3 Encounters:  07/26/21 224 lb (101.6 kg)  07/20/21 218 lb (98.9 kg)  07/16/21 218 lb (98.9 kg)   There is no height or weight on file to calculate BMI.    Physical Exam         Assessment & Plan:    See Problem List for Assessment and Plan of chronic medical problems.

## 2021-10-19 ENCOUNTER — Encounter: Payer: Self-pay | Admitting: Internal Medicine

## 2021-10-19 ENCOUNTER — Ambulatory Visit (INDEPENDENT_AMBULATORY_CARE_PROVIDER_SITE_OTHER): Payer: Medicare Other | Admitting: Internal Medicine

## 2021-10-19 ENCOUNTER — Other Ambulatory Visit: Payer: Self-pay

## 2021-10-19 VITALS — BP 120/80 | HR 85 | Temp 98.2°F | Ht 64.0 in | Wt 220.0 lb

## 2021-10-19 DIAGNOSIS — N200 Calculus of kidney: Secondary | ICD-10-CM | POA: Diagnosis not present

## 2021-10-19 DIAGNOSIS — R310 Gross hematuria: Secondary | ICD-10-CM

## 2021-10-19 DIAGNOSIS — E1165 Type 2 diabetes mellitus with hyperglycemia: Secondary | ICD-10-CM

## 2021-10-19 DIAGNOSIS — R3989 Other symptoms and signs involving the genitourinary system: Secondary | ICD-10-CM

## 2021-10-19 LAB — POC URINALSYSI DIPSTICK (AUTOMATED)
Bilirubin, UA: NEGATIVE
Glucose, UA: NEGATIVE
Ketones, UA: NEGATIVE
Leukocytes, UA: NEGATIVE
Nitrite, UA: NEGATIVE
Protein, UA: POSITIVE — AB
Spec Grav, UA: 1.025 (ref 1.010–1.025)
Urobilinogen, UA: 0.2 E.U./dL
pH, UA: 5.5 (ref 5.0–8.0)

## 2021-10-19 NOTE — Patient Instructions (Addendum)
? ? ? ?  We will send your urine for a culture.   ? ? ?Medications changes include : none   ? ? ? ?A referral was ordered for Urology.     Someone from that office will call you to schedule an appointment.  ? ? ?Return if symptoms worsen or fail to improve. ? ?

## 2021-10-20 ENCOUNTER — Encounter: Payer: Self-pay | Admitting: Internal Medicine

## 2021-10-22 LAB — CULTURE, URINE COMPREHENSIVE

## 2021-10-22 MED ORDER — AMOXICILLIN-POT CLAVULANATE 875-125 MG PO TABS: 1.0000 | ORAL_TABLET | Freq: Two times a day (BID) | ORAL | 0 refills | Status: AC

## 2021-10-22 NOTE — Addendum Note (Signed)
Addended by: Binnie Rail on: 10/22/2021 01:07 PM ? ? Modules accepted: Orders ? ?

## 2021-11-27 ENCOUNTER — Other Ambulatory Visit: Payer: Self-pay | Admitting: Internal Medicine

## 2021-11-27 NOTE — Telephone Encounter (Signed)
Please refill as per office routine med refill policy (all routine meds to be refilled for 3 mo or monthly (per pt preference) up to one year from last visit, then month to month grace period for 3 mo, then further med refills will have to be denied) ? ?

## 2021-12-01 ENCOUNTER — Ambulatory Visit (INDEPENDENT_AMBULATORY_CARE_PROVIDER_SITE_OTHER): Payer: Medicare Other | Admitting: Internal Medicine

## 2021-12-01 ENCOUNTER — Encounter: Payer: Self-pay | Admitting: Internal Medicine

## 2021-12-01 VITALS — BP 112/68 | HR 66 | Temp 98.6°F | Ht 64.0 in | Wt 218.0 lb

## 2021-12-01 DIAGNOSIS — E1165 Type 2 diabetes mellitus with hyperglycemia: Secondary | ICD-10-CM

## 2021-12-01 DIAGNOSIS — E559 Vitamin D deficiency, unspecified: Secondary | ICD-10-CM

## 2021-12-01 DIAGNOSIS — F5101 Primary insomnia: Secondary | ICD-10-CM | POA: Diagnosis not present

## 2021-12-01 DIAGNOSIS — F411 Generalized anxiety disorder: Secondary | ICD-10-CM | POA: Diagnosis not present

## 2021-12-01 DIAGNOSIS — E538 Deficiency of other specified B group vitamins: Secondary | ICD-10-CM

## 2021-12-01 DIAGNOSIS — K2970 Gastritis, unspecified, without bleeding: Secondary | ICD-10-CM

## 2021-12-01 DIAGNOSIS — E7800 Pure hypercholesterolemia, unspecified: Secondary | ICD-10-CM

## 2021-12-01 DIAGNOSIS — N200 Calculus of kidney: Secondary | ICD-10-CM

## 2021-12-01 LAB — URINALYSIS, ROUTINE W REFLEX MICROSCOPIC
Bilirubin Urine: NEGATIVE
Ketones, ur: NEGATIVE
Nitrite: NEGATIVE
Specific Gravity, Urine: 1.02 (ref 1.000–1.030)
Total Protein, Urine: NEGATIVE
Urine Glucose: NEGATIVE
Urobilinogen, UA: 0.2 (ref 0.0–1.0)
pH: 5.5 (ref 5.0–8.0)

## 2021-12-01 LAB — LIPID PANEL
Cholesterol: 179 mg/dL (ref 0–200)
HDL: 55.5 mg/dL (ref >39.00)
LDL Cholesterol: 95 mg/dL (ref 0–99)
NonHDL: 123.12
Total CHOL/HDL Ratio: 3
Triglycerides: 141 mg/dL (ref 0.0–149.0)
VLDL: 28.2 mg/dL (ref 0.0–40.0)

## 2021-12-01 LAB — CBC WITH DIFFERENTIAL/PLATELET
Basophils Absolute: 0.1 10*3/uL (ref 0.0–0.1)
Basophils Relative: 1.1 % (ref 0.0–3.0)
Eosinophils Absolute: 0.2 10*3/uL (ref 0.0–0.7)
Eosinophils Relative: 3.1 % (ref 0.0–5.0)
HCT: 34.6 % — ABNORMAL LOW (ref 36.0–46.0)
Hemoglobin: 10.9 g/dL — ABNORMAL LOW (ref 12.0–15.0)
Lymphocytes Relative: 36.4 % (ref 12.0–46.0)
Lymphs Abs: 2.1 10*3/uL (ref 0.7–4.0)
MCHC: 31.5 g/dL (ref 30.0–36.0)
MCV: 78.7 fl (ref 78.0–100.0)
Monocytes Absolute: 0.6 10*3/uL (ref 0.1–1.0)
Monocytes Relative: 10.9 % (ref 3.0–12.0)
Neutro Abs: 2.7 10*3/uL (ref 1.4–7.7)
Neutrophils Relative %: 48.5 % (ref 43.0–77.0)
Platelets: 259 10*3/uL (ref 150.0–400.0)
RBC: 4.4 Mil/uL (ref 3.87–5.11)
RDW: 14.1 % (ref 11.5–15.5)
WBC: 5.6 10*3/uL (ref 4.0–10.5)

## 2021-12-01 LAB — HEPATIC FUNCTION PANEL
ALT: 14 U/L (ref 0–35)
AST: 18 U/L (ref 0–37)
Albumin: 4 g/dL (ref 3.5–5.2)
Alkaline Phosphatase: 44 U/L (ref 39–117)
Bilirubin, Direct: 0.1 mg/dL (ref 0.0–0.3)
Total Bilirubin: 0.3 mg/dL (ref 0.2–1.2)
Total Protein: 7.4 g/dL (ref 6.0–8.3)

## 2021-12-01 LAB — TSH: TSH: 3.17 u[IU]/mL (ref 0.35–5.50)

## 2021-12-01 LAB — BASIC METABOLIC PANEL
BUN: 22 mg/dL (ref 6–23)
CO2: 29 mEq/L (ref 19–32)
Calcium: 8.9 mg/dL (ref 8.4–10.5)
Chloride: 103 mEq/L (ref 96–112)
Creatinine, Ser: 1.05 mg/dL (ref 0.40–1.20)
GFR: 56.04 mL/min — ABNORMAL LOW (ref >60.00)
Glucose, Bld: 88 mg/dL (ref 70–99)
Potassium: 4 mEq/L (ref 3.5–5.1)
Sodium: 138 mEq/L (ref 135–145)

## 2021-12-01 LAB — VITAMIN D 25 HYDROXY (VIT D DEFICIENCY, FRACTURES): VITD: 29.79 ng/mL — ABNORMAL LOW (ref 30.00–100.00)

## 2021-12-01 LAB — VITAMIN B12: Vitamin B-12: 738 pg/mL (ref 211–911)

## 2021-12-01 LAB — HEMOGLOBIN A1C: Hgb A1c MFr Bld: 5.5 % (ref 4.6–6.5)

## 2021-12-01 MED ORDER — PANTOPRAZOLE SODIUM 40 MG PO TBEC: 40.0000 mg | DELAYED_RELEASE_TABLET | Freq: Every day | ORAL | 3 refills | Status: DC

## 2021-12-01 MED ORDER — CITALOPRAM HYDROBROMIDE 10 MG PO TABS: 10.0000 mg | ORAL_TABLET | Freq: Every day | ORAL | 3 refills | Status: DC

## 2021-12-01 MED ORDER — CLONAZEPAM 1 MG PO TABS: ORAL_TABLET | ORAL | 5 refills | Status: DC

## 2021-12-01 NOTE — Patient Instructions (Signed)
Ok to double up on your Vitamin D to take at least 2000 units per day ? ?Please take all new medication as prescribed  - the protonix 40 mg per day for the stomach ? ?Please take all new medication as prescribed - the celexa 10 mg for nerves, as well as the klonopin as needed if necessary as well ? ?Please continue all other medications as before, and refills have been done if requested. ? ?Please have the pharmacy call with any other refills you may need. ? ?Please continue your efforts at being more active, low cholesterol diet, and weight control. ? ?You are otherwise up to date with prevention measures today. ? ?Please keep your appointments with your specialists as you may have planned ? ?Please go to the LAB at the blood drawing area for the tests to be done ? ?You will be contacted by phone if any changes need to be made immediately.  Otherwise, you will receive a letter about your results with an explanation, but please check with MyChart first. ? ?Please remember to sign up for MyChart if you have not done so, as this will be important to you in the future with finding out test results, communicating by private email, and scheduling acute appointments online when needed. ? ?Please make an Appointment to return in 6 months, or sooner if needed ?
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
## 2021-12-04 ENCOUNTER — Encounter: Payer: Self-pay | Admitting: Internal Medicine

## 2021-12-04 DIAGNOSIS — K2970 Gastritis, unspecified, without bleeding: Secondary | ICD-10-CM | POA: Insufficient documentation

## 2021-12-04 NOTE — Assessment & Plan Note (Signed)
Also for klonpin 0.5 - take 1 in am prn, and 2 in the pm prn sleep as well ?

## 2021-12-04 NOTE — Assessment & Plan Note (Signed)
Lab Results  ?Component Value Date  ? HGBA1C 5.5 12/01/2021  ? ?Stable, pt to continue current medical treatment  - diet ? ?

## 2021-12-04 NOTE — Assessment & Plan Note (Signed)
Lab Results  ?Component Value Date  ? [id number] 738 12/01/2021  ? ?Stable, cont oral replacement - b12 1000 mcg qd ? ?

## 2021-12-04 NOTE — Assessment & Plan Note (Signed)
Mild uncontrolled, for add celexa 10 qd, decline need for counseling ?

## 2021-12-04 NOTE — Assessment & Plan Note (Signed)
asympt now, declines urology referral ?

## 2021-12-04 NOTE — Assessment & Plan Note (Signed)
Mild to mod, for protonix 40 qd,  to f/u any worsening symptoms or concerns, decliens GI referral ?

## 2021-12-04 NOTE — Assessment & Plan Note (Signed)
Lab Results  ?Component Value Date  ? [place] 95 12/01/2021  ? ?Stable, pt to continue current statin  - diet low chol ? ?

## 2021-12-04 NOTE — Assessment & Plan Note (Signed)
Last vitamin D ?Lab Results  ?Component Value Date  ? VD25OH 29.79 (L) 12/01/2021  ? ?Low, to start oral replacement 2000 u qd ?

## 2021-12-23 ENCOUNTER — Telehealth: Payer: Medicare Other | Admitting: Emergency Medicine

## 2021-12-23 DIAGNOSIS — M546 Pain in thoracic spine: Secondary | ICD-10-CM

## 2021-12-24 ENCOUNTER — Encounter: Payer: Self-pay | Admitting: Internal Medicine

## 2021-12-24 MED ORDER — CYCLOBENZAPRINE HCL 5 MG PO TABS: 5.0000 mg | ORAL_TABLET | Freq: Three times a day (TID) | ORAL | 0 refills | Status: DC | PRN

## 2021-12-24 NOTE — Progress Notes (Signed)
We are sorry that you are not feeling well.  Here is how we plan to help!  Based on what you have shared with me it looks like you mostly have acute back pain.  Acute back pain is defined as musculoskeletal pain that can resolve in 1-3 weeks with conservative treatment.  I have prescribed flexeril '5mg'$  up to 3 times daily.  Please keep in mind that muscle relaxer's can cause fatigue and should not be taken while at work or driving. You can take the flexeril (cyclobenzaprine) up to 3 times daily but if you need to drive or work, you can take it only at bedtime. Continue taking tylenol per package directions.   Back pain is very common.  The pain often gets better over time.  The cause of back pain is usually not dangerous.  Most people can learn to manage their back pain on their own.  Home Care Stay active.  Start with short walks on flat ground if you can.  Try to walk farther each day. Do not sit, drive or stand in one place for more than 30 minutes.  Do not stay in bed. Do not avoid exercise or work.  Activity can help your back heal faster. Be careful when you bend or lift an object.  Bend at your knees, keep the object close to you, and do not twist. Sleep on a firm mattress.  Lie on your side, and bend your knees.  If you lie on your back, put a pillow under your knees. Only take medicines as told by your doctor. Put ice on the injured area. Put ice in a plastic bag Place a towel between your skin and the bag Leave the ice on for 15-20 minutes, 3-4 times a day for the first 2-3 days. 210 After that, you can switch between ice and heat packs. Ask your doctor about back exercises or massage. Avoid feeling anxious or stressed.  Find good ways to deal with stress, such as exercise.  Get Help Right Way If: Your pain does not go away with rest or medicine. Your pain does not go away in 1 week. You have new problems. You do not feel well. The pain spreads into your legs. You cannot  control when you poop (bowel movement) or pee (urinate) You feel sick to your stomach (nauseous) or throw up (vomit) You have belly (abdominal) pain. You feel like you may pass out (faint). If you develop a fever.  Make Sure you: Understand these instructions. Will watch your condition Will get help right away if you are not doing well or get worse.  Your e-visit answers were reviewed by a board certified advanced clinical practitioner to complete your personal care plan.  Depending on the condition, your plan could have included both over the counter or prescription medications.  If there is a problem please reply  once you have received a response from your provider.  Your safety is important to [place].  If you have drug allergies check your prescription carefully.    You can use MyChart to ask questions about today's visit, request a non-urgent call back, or ask for a work or school excuse for 24 hours related to this e-Visit. If it has been greater than 24 hours you will need to follow up with your provider, or enter a new e-Visit to address those concerns.  You will get an e-mail in the next two days asking about your experience.  I hope that your e-visit  has been valuable and will speed your recovery. Thank you for using e-visits.  I have spent 5 minutes in review of e-visit questionnaire, review and updating patient chart, medical decision making and response to patient.   Willeen Cass, PhD, FNP-BC

## 2021-12-24 NOTE — Telephone Encounter (Signed)
Sorry, I dont normally rx narcotics more than tramadol, but at least it dulled the pain, she has the flexeril as well, and she can also try the Fallon patch

## 2021-12-27 ENCOUNTER — Encounter: Payer: Self-pay | Admitting: Family Medicine

## 2021-12-27 ENCOUNTER — Ambulatory Visit: Payer: Self-pay

## 2021-12-27 ENCOUNTER — Ambulatory Visit (INDEPENDENT_AMBULATORY_CARE_PROVIDER_SITE_OTHER): Payer: Medicare Other | Admitting: Family Medicine

## 2021-12-27 ENCOUNTER — Encounter: Payer: Self-pay | Admitting: Internal Medicine

## 2021-12-27 ENCOUNTER — Encounter: Payer: Self-pay | Admitting: Sports Medicine

## 2021-12-27 VITALS — BP 130/84 | HR 90 | Ht 64.0 in | Wt 219.0 lb

## 2021-12-27 DIAGNOSIS — M25562 Pain in left knee: Secondary | ICD-10-CM | POA: Diagnosis not present

## 2021-12-27 NOTE — Patient Instructions (Addendum)
Thank you for coming in today.   You received a steroid injection in your left knee today. Seek immediate medical attention if the joint becomes red, extremely painful, or is oozing fluid.   Please use Voltaren gel (Generic Diclofenac Gel) up to 4x daily for pain as needed.  This is available over-the-counter as both the name brand Voltaren gel and the generic diclofenac gel.   Recheck with Dr Glennon Mac or myself if needed.

## 2021-12-27 NOTE — Progress Notes (Unsigned)
I, Peterson Lombard, LAT, ATC acting as a scribe for Lynne Leader, MD.  Elizabeth Hawkins is a 66 y.o. female who presents to [place] at [facility] today for continued L knee pain. Pt was previously seen by Dr. Glennon Mac on 07/26/21 and was given a L knee steroid injection. Pt sent a MyChart message earlier today reporting returning L knee pain. Today, pt reports L knee started hurting her again yesterday. Pt can't recall a mechanism and isn't sure if she "stepped down wrong" on her L knee. Pt locates pain to the anterior and posterior aspect and says her knee feels "tight."  L knee swelling: no Mechanical symptoms: Aggravates: walking, knee flexion Treatment tried: Tylenol, compression wrap   Dx imaging: 07/20/21 L knee XR  Pertinent review of systems: No fevers or chills  Relevant historical information: Hypertension   Exam:  BP 130/84   Pulse 90   Ht '5\' 4"'$  (1.626 m)   Wt 219 lb (99.3 kg)   SpO2 98%   BMI 37.59 kg/m  General: Well Developed, well nourished, and in no acute distress.   MSK: Left knee: Mild effusion.  Normal-appearing otherwise. Normal motion. Tender palpation medial joint line. Stable ligamentous exam. Intact strength. Positive medial McMurray's test.    Lab and Radiology Results  Procedure: Real-time Ultrasound Guided Injection of left knee superior lateral patellar space Device: Philips Affiniti 50G Images permanently stored and available for review in PACS Verbal informed consent obtained.  Discussed risks and benefits of procedure. Warned about infection, bleeding, hyperglycemia damage to structures among others. Patient expresses understanding and agreement Time-out conducted.   Noted no overlying erythema, induration, or other signs of local infection.   Skin prepped in a sterile fashion.   Local anesthesia: Topical Ethyl chloride.   With sterile technique and under real time ultrasound guidance: 40 mg of Kenalog and 2 mL of  Marcaine injected into knee joint. Fluid seen entering the joint capsule.   Completed without difficulty   Pain immediately resolved suggesting accurate placement of the medication.   Advised to call if fevers/chills, erythema, induration, drainage, or persistent bleeding.   Images permanently stored and available for review in the ultrasound unit.  Impression: Technically successful ultrasound guided injection.   EXAM: LEFT KNEE 3 VIEWS   COMPARISON:  None.   FINDINGS: No evidence of acute fracture, dislocation, or joint effusion. Mild to moderate severity medial marginal osteophyte formation is noted. There is moderate severity medial tibiofemoral compartment space narrowing. Mild lateral tibiofemoral and moderate severity patellofemoral narrowing are also seen. Soft tissues are unremarkable.   IMPRESSION: Tricompartmental osteoarthritis of the left knee, most prominent in the medial tibiofemoral compartment.     Electronically Signed   By: Virgina Norfolk M.D.   On: 07/21/2021 19:20   I, Lynne Leader, personally (independently) visualized and performed the interpretation of the images attached in this note.       Assessment and Plan: 66 y.o. female with left knee pain thought to be due to exacerbation of DJD.  This is an exacerbation of a chronic issue.  Plan for steroid injection today as well as Voltaren gel.  Recheck back as needed.   PDMP not reviewed this encounter. Orders Placed This Encounter  Procedures   [place] LIMITED JOINT SPACE STRUCTURES LOW LEFT(NO LINKED CHARGES)    Order Specific Question:   Reason for Exam (SYMPTOM  OR DIAGNOSIS REQUIRED)    Answer:   left knee pain    Order Specific  Question:   Preferred imaging location?    Answer:   [facility]   No orders of the defined types were placed in this encounter.    Discussed warning signs or symptoms. Please see discharge instructions. Patient expresses  understanding.   The above documentation has been reviewed and is accurate and complete Lynne Leader, M.D.

## 2021-12-28 ENCOUNTER — Ambulatory Visit: Payer: Medicare Other | Admitting: Family Medicine

## 2021-12-28 ENCOUNTER — Encounter: Payer: Self-pay | Admitting: Internal Medicine

## 2021-12-28 MED ORDER — POTASSIUM CHLORIDE ER 10 MEQ PO CPCR: 10.0000 meq | ORAL_CAPSULE | Freq: Every day | ORAL | 3 refills | Status: DC

## 2021-12-28 MED ORDER — B-12 2500 MCG SL SUBL
2500.0000 ug | SUBLINGUAL_TABLET | Freq: Every day | SUBLINGUAL | 1 refills | Status: DC
Start: 2021-12-28 — End: 2022-06-02

## 2021-12-28 NOTE — Telephone Encounter (Signed)
Pls advise on email.. potassium never been filled by you.Marland KitchenJohny Chess

## 2022-01-04 NOTE — Progress Notes (Unsigned)
Benito Mccreedy D.[place] Phone: [phone]   Assessment and Plan:     There are no diagnoses linked to this encounter.  ***   Pertinent previous records reviewed include ***   Follow Up: ***     Subjective:   I, Elizabeth Hawkins, am serving as a Education administrator for Doctor Glennon Mac   Chief Complaint: knee pain    HPI:    07/20/2021  Patient is a 66 year old female complaining of knee pain. Patient states that has had surgery (meniscus 2008)  on R knee can palpate pain spot  medial aspect of knee has it on left knee as well but more painful on R feels like a clicking has been using tylenol and topical cream and it is not working. No numbness and tingling. Walking in general aggravates  Radiates: no  01/05/2022 Patient states     Relevant Historical Information: Hypertension, DM type II  Additional pertinent review of systems negative.   Current Outpatient Medications:    acetaminophen (TYLENOL) 500 MG tablet, Take 1,000 mg by mouth every 8 (eight) hours as needed for headache. , Disp: , Rfl:    albuterol (VENTOLIN HFA) 108 (90 Base) MCG/ACT inhaler, Inhale 2 puffs into the lungs every 6 (six) hours as needed for wheezing or shortness of breath., Disp: 1 each, Rfl: 2   amLODipine (NORVASC) 10 MG tablet, Take 1 tablet (10 mg total) by mouth daily., Disp: 90 tablet, Rfl: 3   Ascorbic Acid (VITAMIN C) 1000 MG tablet, Take 1,000 mg by mouth daily., Disp: , Rfl:    cholecalciferol (VITAMIN D) 1000 units tablet, Take 1,000 Units by mouth daily., Disp: , Rfl:    citalopram (CELEXA) 10 MG tablet, Take 1 tablet (10 mg total) by mouth daily., Disp: 90 tablet, Rfl: 3   clonazePAM (KLONOPIN) 1 MG tablet, 1/2 tab by mouth in the am, and 1 tab by mouth in the PM as needed, Disp: 45 tablet, Rfl: 5   Cyanocobalamin (B-12) 2500 MCG SUBL, Take 2,500 mcg by mouth daily., Disp: 90 tablet, Rfl: 1   Cyanocobalamin 2500  MCG SUBL, Take 1 tablet by mouth daily., Disp: , Rfl:    cyclobenzaprine (FLEXERIL) 5 MG tablet, Take 1 tablet (5 mg total) by mouth 3 (three) times daily as needed for muscle spasms. (Patient not taking: Reported on 12/27/2021), Disp: 21 tablet, Rfl: 0   docusate sodium (COLACE) 100 MG capsule, Take 200 mg by mouth 2 (two) times daily as needed for mild constipation., Disp: , Rfl:    EYSUVIS 0.25 % SUSP, Apply 1 drop to eye 2 (two) times daily., Disp: , Rfl:    fluconazole (DIFLUCAN) 150 MG tablet, 1 tab by mouth every 3 days as needed, Disp: 2 tablet, Rfl: 1   hydrochlorothiazide (MICROZIDE) 12.5 MG capsule, TAKE 1 CAPSULE BY MOUTH EVERY DAY, Disp: 90 capsule, Rfl: 2   levothyroxine (SYNTHROID) 50 MCG tablet, TAKE ONE-HALF TABLET BY  MOUTH DAILY BEORE BREAKFAST, Disp: 45 tablet, Rfl: 0   metoprolol succinate (TOPROL-XL) 50 MG 24 hr tablet, Take 1 tablet (50 mg total) by mouth daily. Take with or immediately following a meal., Disp: 90 tablet, Rfl: 3   pantoprazole (PROTONIX) 40 MG tablet, Take 1 tablet (40 mg total) by mouth daily., Disp: 90 tablet, Rfl: 3   polyethylene glycol powder (GLYCOLAX/MIRALAX) 17 GM/SCOOP powder, Take 17 g by mouth daily. , Disp: , Rfl:    potassium  chloride (MICRO-K) 10 MEQ CR capsule, Take 1 capsule (10 mEq total) by mouth daily., Disp: 90 capsule, Rfl: 3   telmisartan (MICARDIS) 80 MG tablet, TAKE 1 TABLET BY MOUTH  DAILY, Disp: 90 tablet, Rfl: 0   TYRVAYA 0.03 MG/ACT SOLN, Place 1 spray into both nostrils 2 (two) times daily., Disp: , Rfl:    Objective:     There were no vitals filed for this visit.    There is no height or weight on file to calculate BMI.    Physical Exam:    ***   Electronically signed by:  Benito Mccreedy D.[facility] 4:42 PM 01/04/22

## 2022-01-05 ENCOUNTER — Ambulatory Visit (INDEPENDENT_AMBULATORY_CARE_PROVIDER_SITE_OTHER): Payer: Medicare Other | Admitting: Sports Medicine

## 2022-01-05 VITALS — BP 138/80 | HR 91 | Ht 64.0 in | Wt 219.0 lb

## 2022-01-05 DIAGNOSIS — M25562 Pain in left knee: Secondary | ICD-10-CM | POA: Diagnosis not present

## 2022-01-05 DIAGNOSIS — M1711 Unilateral primary osteoarthritis, right knee: Secondary | ICD-10-CM

## 2022-01-05 DIAGNOSIS — M1712 Unilateral primary osteoarthritis, left knee: Secondary | ICD-10-CM

## 2022-01-05 DIAGNOSIS — G8929 Other chronic pain: Secondary | ICD-10-CM

## 2022-01-05 DIAGNOSIS — M25561 Pain in right knee: Secondary | ICD-10-CM | POA: Diagnosis not present

## 2022-01-05 NOTE — Patient Instructions (Addendum)
Good to see you  ?As needed follow up  ?

## 2022-01-24 ENCOUNTER — Other Ambulatory Visit: Payer: Self-pay | Admitting: Internal Medicine

## 2022-01-24 NOTE — Telephone Encounter (Signed)
Please refill as per office routine med refill policy (all routine meds to be refilled for 3 mo or monthly (per pt preference) up to one year from last visit, then month to month grace period for 3 mo, then further med refills will have to be denied) ? ?

## 2022-01-25 ENCOUNTER — Encounter: Payer: Self-pay | Admitting: Internal Medicine

## 2022-01-25 ENCOUNTER — Other Ambulatory Visit: Payer: Self-pay | Admitting: Internal Medicine

## 2022-01-25 NOTE — Telephone Encounter (Signed)
Please refill as per office routine med refill policy (all routine meds to be refilled for 3 mo or monthly (per pt preference) up to one year from last visit, then month to month grace period for 3 mo, then further med refills will have to be denied) ? ?

## 2022-01-26 ENCOUNTER — Encounter: Payer: Self-pay | Admitting: Internal Medicine

## 2022-01-26 MED ORDER — TELMISARTAN 80 MG PO TABS: 80.0000 mg | ORAL_TABLET | Freq: Every day | ORAL | 1 refills | Status: DC

## 2022-02-23 ENCOUNTER — Ambulatory Visit (INDEPENDENT_AMBULATORY_CARE_PROVIDER_SITE_OTHER): Payer: PPO | Admitting: Sports Medicine

## 2022-02-23 VITALS — BP 132/79 | HR 73 | Ht 64.0 in | Wt 219.0 lb

## 2022-02-23 DIAGNOSIS — G8929 Other chronic pain: Secondary | ICD-10-CM

## 2022-02-23 DIAGNOSIS — M1711 Unilateral primary osteoarthritis, right knee: Secondary | ICD-10-CM | POA: Diagnosis not present

## 2022-02-23 DIAGNOSIS — M25561 Pain in right knee: Secondary | ICD-10-CM

## 2022-02-23 NOTE — Patient Instructions (Addendum)
Good to see you  Tylenol [phone] mg 2-3 times a day for pain relief  1 week follow up for HA injection  4 week follow up for Dr. Glennon Mac

## 2022-02-23 NOTE — Progress Notes (Signed)
Elizabeth Hawkins D.[place] Phone: [phone]   Assessment and Plan:     1. Osteoarthritis of right knee, unspecified osteoarthritis type 2. Chronic pain of right knee -Chronic with exacerbation, subsequent visit - Recurrence of right knee pain with underlying osteoarthritis worsening over the past several weeks and concerning for meniscal pathology based on episodes of locking, giving out, pain radiating to back of knee, although Elizabeth Hawkins does not have new mechanism of injury - Elizabeth Hawkins's last CSI on 01/06/2024 only provided around 4 weeks relief, so overall I consider this a failed therapy. - Elizabeth Hawkins agreeable to HA injection.  We will submit prior authorization today and Elizabeth Hawkins can follow-up for procedure only visit HA injection next week - Recommend to decrease physical activity at this time that flares pain - Start Tylenol 500 to 1000 mg tablets 2-3 times a day for day-to-day pain relief    Pertinent previous records reviewed include none   Follow Up: 1 week for procedure only HA injection, and then follow-up 4 weeks after injection for reevaluation   Subjective:   I, Elizabeth Hawkins, am serving as a Education administrator for Doctor Glennon Mac   Chief Complaint: knee pain    HPI:    07/20/2021  Elizabeth Hawkins is a 66 year old female complaining of knee pain. Elizabeth Hawkins states that has had surgery (meniscus 2008)  on R knee can palpate pain spot  medial aspect of knee has it on left knee as well but more painful on R feels like a clicking has been using tylenol and topical cream and it is not working. No numbness and tingling. Walking in general aggravates  Radiates: no   01/05/2022 Elizabeth Hawkins states that her right knee is bothering her it is now popping not painful just aggravating,    02/23/2022 Elizabeth Hawkins states that her knee buckled on her the other day , and the pain is in the back and it is tight , is using tylenol like  crazy    Relevant Historical Information: Hypertension, DM type II  Additional pertinent review of systems negative.   Current Outpatient Medications:    acetaminophen (TYLENOL) 500 MG tablet, Take 1,000 mg by mouth every 8 (eight) hours as needed for headache. , Disp: , Rfl:    albuterol (VENTOLIN HFA) 108 (90 Base) MCG/ACT inhaler, Inhale 2 puffs into the lungs every 6 (six) hours as needed for wheezing or shortness of breath., Disp: 1 each, Rfl: 2   amLODipine (NORVASC) 10 MG tablet, TAKE 1 TABLET BY MOUTH  DAILY, Disp: 90 tablet, Rfl: 1   Ascorbic Acid (VITAMIN C) 1000 MG tablet, Take 1,000 mg by mouth daily., Disp: , Rfl:    cholecalciferol (VITAMIN D) 1000 units tablet, Take 1,000 Units by mouth daily., Disp: , Rfl:    citalopram (CELEXA) 10 MG tablet, Take 1 tablet (10 mg total) by mouth daily., Disp: 90 tablet, Rfl: 3   clonazePAM (KLONOPIN) 1 MG tablet, 1/2 tab by mouth in the am, and 1 tab by mouth in the PM as needed, Disp: 45 tablet, Rfl: 5   Cyanocobalamin (B-12) 2500 MCG SUBL, Take 2,500 mcg by mouth daily., Disp: 90 tablet, Rfl: 1   cyclobenzaprine (FLEXERIL) 5 MG tablet, Take 1 tablet (5 mg total) by mouth 3 (three) times daily as needed for muscle spasms., Disp: 21 tablet, Rfl: 0   docusate sodium (COLACE) 100 MG capsule, Take 200 mg by mouth 2 (two) times daily as needed  for mild constipation., Disp: , Rfl:    EYSUVIS 0.25 % SUSP, Apply 1 drop to eye 2 (two) times daily., Disp: , Rfl:    fluconazole (DIFLUCAN) 150 MG tablet, 1 tab by mouth every 3 days as needed, Disp: 2 tablet, Rfl: 1   hydrochlorothiazide (MICROZIDE) 12.5 MG capsule, TAKE 1 CAPSULE BY MOUTH  DAILY, Disp: 90 capsule, Rfl: 0   levothyroxine (SYNTHROID) 50 MCG tablet, TAKE ONE-HALF TABLET BY  MOUTH DAILY BEORE BREAKFAST, Disp: 45 tablet, Rfl: 0   metoprolol succinate (TOPROL-XL) 50 MG 24 hr tablet, TAKE 1 TABLET BY MOUTH  DAILY WITH OR IMMEDIATELY  FOLLOWING A MEAL, Disp: 90 tablet, Rfl: 0   polyethylene  glycol powder (GLYCOLAX/MIRALAX) 17 GM/SCOOP powder, Take 17 g by mouth daily. , Disp: , Rfl:    potassium chloride (MICRO-K) 10 MEQ CR capsule, Take 1 capsule (10 mEq total) by mouth daily., Disp: 90 capsule, Rfl: 3   telmisartan (MICARDIS) 80 MG tablet, Take 1 tablet (80 mg total) by mouth daily., Disp: 90 tablet, Rfl: 1   TYRVAYA 0.03 MG/ACT SOLN, Place 1 spray into both nostrils 2 (two) times daily., Disp: , Rfl:    Cyanocobalamin 2500 MCG SUBL, Take 1 tablet by mouth daily., Disp: , Rfl:    pantoprazole (PROTONIX) 40 MG tablet, Take 1 tablet (40 mg total) by mouth daily., Disp: 90 tablet, Rfl: 3   Objective:     Vitals:   02/23/22 1326  BP: 132/79  Pulse: 73  SpO2: 99%  Weight: 219 lb (99.3 kg)  Height: '5\' 4"'$  (1.626 m)      Body mass index is 37.59 kg/m.    Physical Exam:    General:  awake, alert oriented, no acute distress nontoxic Skin: no suspicious lesions or rashes Neuro:sensation intact, no deficits, strength 5/5 with no deficits, no atrophy, normal muscle tone Psych: No signs of anxiety, depression or other mood disorder   Right knee: mild swelling No deformity Neg fluid wave, joint milking ROM Flex 100, Ext 5 TTP medial and lateral joint line NTTP over the quad tendon, medial fem condyle, lat fem condyle, patella, plica, patella tendon, tibial tuberostiy, fibular head, posterior fossa, pes anserine bursa, gerdy's tubercle,   Neg anterior and posterior drawer Neg lachman Neg sag sign Negative varus stress Negative valgus stress Positive McMurray     Gait normal    Electronically signed by:  Elizabeth Hawkins D.[facility] 1:40 PM 02/23/22

## 2022-03-02 ENCOUNTER — Ambulatory Visit: Payer: Self-pay

## 2022-03-02 ENCOUNTER — Ambulatory Visit (INDEPENDENT_AMBULATORY_CARE_PROVIDER_SITE_OTHER): Payer: PPO | Admitting: Family Medicine

## 2022-03-02 DIAGNOSIS — G8929 Other chronic pain: Secondary | ICD-10-CM | POA: Diagnosis not present

## 2022-03-02 DIAGNOSIS — M1711 Unilateral primary osteoarthritis, right knee: Secondary | ICD-10-CM

## 2022-03-02 DIAGNOSIS — M25561 Pain in right knee: Secondary | ICD-10-CM

## 2022-03-02 DIAGNOSIS — M170 Bilateral primary osteoarthritis of knee: Secondary | ICD-10-CM | POA: Diagnosis not present

## 2022-03-02 MED ORDER — HYALURONAN 30 MG/2ML IX SOSY
30.0000 mg | PREFILLED_SYRINGE | Freq: Once | INTRA_ARTICULAR | Status: AC
  Administered 2022-03-02: 30 mg via INTRA_ARTICULAR

## 2022-03-02 NOTE — Progress Notes (Signed)
Elizabeth Hawkins presents to clinic today for Orthovisc injection right knee 1/3  Procedure: Real-time Ultrasound Guided Injection of right knee superior lateral patellar space Device: Philips Affiniti 50G Images permanently stored and available for review in PACS Verbal informed consent obtained.  Discussed risks and benefits of procedure. Warned about infection, bleeding, damage to structures among others. Patient expresses understanding and agreement Time-out conducted.   Noted no overlying erythema, induration, or other signs of local infection.   Skin prepped in a sterile fashion.   Local anesthesia: Topical Ethyl chloride.   With sterile technique and under real time ultrasound guidance: Orthovisc 30 mg injected into knee joint. Fluid seen entering the joint capsule.   Completed without difficulty   Advised to call if fevers/chills, erythema, induration, drainage, or persistent bleeding.   Images permanently stored and available for review in the ultrasound unit.  Impression: Technically successful ultrasound guided injection.  Lot number: [id number]  Return in 1 week for Orthovisc injection right knee 2/3 and perhaps left knee 1/3.  Of note she would like to proceed with authorization and injection of Orthovisc series for her left knee as well.

## 2022-03-02 NOTE — Patient Instructions (Addendum)
Thank you for coming in today.   You received an injection today. Seek immediate medical attention if the joint becomes red, extremely painful, or is oozing fluid.   Schedule the 2nd injection for next week and the 3rd for the week after that.

## 2022-03-09 ENCOUNTER — Ambulatory Visit: Payer: Self-pay

## 2022-03-09 ENCOUNTER — Ambulatory Visit (INDEPENDENT_AMBULATORY_CARE_PROVIDER_SITE_OTHER): Payer: PPO | Admitting: Family Medicine

## 2022-03-09 DIAGNOSIS — M25562 Pain in left knee: Secondary | ICD-10-CM | POA: Diagnosis not present

## 2022-03-09 DIAGNOSIS — M25561 Pain in right knee: Secondary | ICD-10-CM | POA: Diagnosis not present

## 2022-03-09 DIAGNOSIS — M1711 Unilateral primary osteoarthritis, right knee: Secondary | ICD-10-CM

## 2022-03-09 DIAGNOSIS — M170 Bilateral primary osteoarthritis of knee: Secondary | ICD-10-CM | POA: Diagnosis not present

## 2022-03-09 DIAGNOSIS — G8929 Other chronic pain: Secondary | ICD-10-CM

## 2022-03-09 MED ORDER — HYALURONAN 30 MG/2ML IX SOSY
60.0000 mg | PREFILLED_SYRINGE | Freq: Once | INTRA_ARTICULAR | Status: AC
  Administered 2022-03-09: 60 mg via INTRA_ARTICULAR

## 2022-03-09 MED ORDER — HYALURONAN 30 MG/2ML IX SOSY
30.0000 mg | PREFILLED_SYRINGE | Freq: Once | INTRA_ARTICULAR | Status: AC
  Administered 2022-03-09: 30 mg via INTRA_ARTICULAR

## 2022-03-09 NOTE — Patient Instructions (Addendum)
Thank you for coming in today.   You received an injection today. Seek immediate medical attention if the joint becomes red, extremely painful, or is oozing fluid.   Check back as needed  Schedule the 3rd orthovisc injection in you right knee for next week and the 2nd for the week after that.

## 2022-03-09 NOTE — Progress Notes (Signed)
Elizabeth Hawkins presents to clinic today for Orthovisc injection right knee 2/3 and left knee 1/3  Procedure: Real-time Ultrasound Guided Injection of right knee superior lateral patellar space Device: Phil[employer] Affiniti 50G Images permanently stored and available for review in PACS Verbal informed consent obtained.  Discussed risks and benefits of procedure. Warned about infection, bleeding, damage to structures among others. Patient expresses understanding and agreement Time-out conducted.   Noted no overlying erythema, induration, or other signs of local infection.   Skin prepped in a sterile fashion.   Local anesthesia: Topical Ethyl chloride.   With sterile technique and under real time ultrasound guidance: 30 mg Orthovisc injected into knee joint. Fluid seen entering the joint capsule.   Completed without difficulty   Advised to call if fevers/chills, erythema, induration, drainage, or persistent bleeding.   Images permanently stored and available for review in the ultrasound unit.  Impression: Technically successful ultrasound guided injection.  Procedure: Real-time Ultrasound Guided Injection of left knee superior lateral patellar space Device: Philips Affiniti 50G Images permanently stored and available for review in PACS Verbal informed consent obtained.  Discussed risks and benefits of procedure. Warned about infection, bleeding, damage to structures among others. Patient expresses understanding and agreement Time-out conducted.   Noted no overlying erythema, induration, or other signs of local infection.   Skin prepped in a sterile fashion.   Local anesthesia: Topical Ethyl chloride.   With sterile technique and under real time ultrasound guidance: Orthovisc 30 mg injected into knee joint. Fluid seen entering the joint capsule.   Completed without difficulty    Advised to call if fevers/chills, erythema, induration, drainage, or persistent bleeding.   Images permanently stored and  available for review in the ultrasound unit.  Impression: Technically successful ultrasound guided injection.   Lot number: [id number] for both injections  Return in 1 week for Orthovisc injection right knee 3/3 and left knee 2/3

## 2022-03-13 ENCOUNTER — Other Ambulatory Visit: Payer: Self-pay | Admitting: Internal Medicine

## 2022-03-14 NOTE — Telephone Encounter (Signed)
Please refill as per office routine med refill policy (all routine meds to be refilled for 3 mo or monthly (per pt preference) up to one year from last visit, then month to month grace period for 3 mo, then further med refills will have to be denied) ? ?

## 2022-03-16 ENCOUNTER — Encounter (INDEPENDENT_AMBULATORY_CARE_PROVIDER_SITE_OTHER): Payer: Self-pay

## 2022-03-16 ENCOUNTER — Ambulatory Visit (INDEPENDENT_AMBULATORY_CARE_PROVIDER_SITE_OTHER): Payer: PPO | Admitting: Family Medicine

## 2022-03-16 ENCOUNTER — Ambulatory Visit: Payer: Self-pay

## 2022-03-16 DIAGNOSIS — M170 Bilateral primary osteoarthritis of knee: Secondary | ICD-10-CM

## 2022-03-16 DIAGNOSIS — M25562 Pain in left knee: Secondary | ICD-10-CM

## 2022-03-16 DIAGNOSIS — G8929 Other chronic pain: Secondary | ICD-10-CM

## 2022-03-16 DIAGNOSIS — M25561 Pain in right knee: Secondary | ICD-10-CM

## 2022-03-16 MED ORDER — HYALURONAN 30 MG/2ML IX SOSY
60.0000 mg | PREFILLED_SYRINGE | Freq: Once | INTRA_ARTICULAR | Status: AC
  Administered 2022-03-16: 60 mg via INTRA_ARTICULAR

## 2022-03-16 NOTE — Patient Instructions (Addendum)
Thank you for coming in today.   You received an injection today. Seek immediate medical attention if the joint becomes red, extremely painful, or is oozing fluid.   We will see you next week for the 3rd gel shot in your left knee.

## 2022-03-16 NOTE — Progress Notes (Signed)
[place] presents to clinic today for Orthovisc injection right knee 3/3 and left knee 2/3  Procedure: Real-time Ultrasound Guided Injection of right knee superior lateral patellar space Device: Philips Affiniti 50G Images permanently stored and available for review in PACS Verbal informed consent obtained.  Discussed risks and benefits of procedure. Warned about infection, bleeding, damage to structures among others. Patient expresses understanding and agreement Time-out conducted.   Noted no overlying erythema, induration, or other signs of local infection.   Skin prepped in a sterile fashion.   Local anesthesia: Topical Ethyl chloride.   With sterile technique and under real time ultrasound guidance: Orthovisc 30 mg injected into knee joint. Fluid seen entering the joint capsule.   Completed without difficulty  .   Advised to call if fevers/chills, erythema, induration, drainage, or persistent bleeding.   Images permanently stored and available for review in the ultrasound unit.  Impression: Technically successful ultrasound guided injection.    Procedure: Real-time Ultrasound Guided Injection of left knee superior lateral patellar space Device: Philips Affiniti 50G Images permanently stored and available for review in PACS Verbal informed consent obtained.  Discussed risks and benefits of procedure. Warned about infection, bleeding, damage to structures among others. Patient expresses understanding and agreement Time-out conducted.   Noted no overlying erythema, induration, or other signs of local infection.   Skin prepped in a sterile fashion.   Local anesthesia: Topical Ethyl chloride.   With sterile technique and under real time ultrasound guidance: Orthovisc 30 mg injected into knee joint. Fluid seen entering the joint capsule.   Completed without difficulty   Advised to call if fevers/chills, erythema, induration, drainage, or persistent bleeding.   Images permanently stored and  available for review in the ultrasound unit.  Impression: Technically successful ultrasound guided injection.  Lot number: [id number]  Return in 1 week for Orthovisc injection left knee 3/3

## 2022-03-23 ENCOUNTER — Ambulatory Visit: Payer: PPO | Admitting: Family Medicine

## 2022-03-28 ENCOUNTER — Ambulatory Visit: Payer: Self-pay

## 2022-03-28 ENCOUNTER — Ambulatory Visit (INDEPENDENT_AMBULATORY_CARE_PROVIDER_SITE_OTHER): Payer: PPO | Admitting: Family Medicine

## 2022-03-28 DIAGNOSIS — M170 Bilateral primary osteoarthritis of knee: Secondary | ICD-10-CM

## 2022-03-28 DIAGNOSIS — G8929 Other chronic pain: Secondary | ICD-10-CM

## 2022-03-28 DIAGNOSIS — M25562 Pain in left knee: Secondary | ICD-10-CM | POA: Diagnosis not present

## 2022-03-28 MED ORDER — HYALURONAN 30 MG/2ML IX SOSY
30.0000 mg | PREFILLED_SYRINGE | Freq: Once | INTRA_ARTICULAR | Status: AC
  Administered 2022-03-28: 30 mg via INTRA_ARTICULAR

## 2022-03-28 NOTE — Progress Notes (Signed)
Elizabeth Hawkins presents to clinic today for Orthovisc injection left knee 3/3  Procedure: Real-time Ultrasound Guided Injection of left knee superior lateral patellar space Device: [employer] Affiniti 50G Images permanently stored and available for review in PACS Verbal informed consent obtained.  Discussed risks and benefits of procedure. Warned about infection, bleeding, damage to structures among others. Patient expresses understanding and agreement Time-out conducted.   Noted no overlying erythema, induration, or other signs of local infection.   Skin prepped in a sterile fashion.   Local anesthesia: Topical Ethyl chloride.   With sterile technique and under real time ultrasound guidance: Orthovisc 30 mg injected into knee joint. Fluid seen entering the joint capsule.   Completed without difficulty  \ Advised to call if fevers/chills, erythema, induration, drainage, or persistent bleeding.   Images permanently stored and available for review in the ultrasound unit.  Impression: Technically successful ultrasound guided injection.    Lot number: [id number]  Return as needed.  Consider knee replacement if not improving.

## 2022-03-28 NOTE — Patient Instructions (Signed)
Thank you for coming in today.   You received an injection today. Seek immediate medical attention if the joint becomes red, extremely painful, or is oozing fluid.   We can repeat the gel shots in 6 months if needed  Recheck as needed

## 2022-05-09 ENCOUNTER — Other Ambulatory Visit: Payer: Self-pay

## 2022-05-09 ENCOUNTER — Encounter: Payer: Self-pay | Admitting: Internal Medicine

## 2022-05-09 MED ORDER — AMLODIPINE BESYLATE 10 MG PO TABS: 10.0000 mg | ORAL_TABLET | Freq: Every day | ORAL | 1 refills | Status: DC

## 2022-05-09 MED ORDER — LEVOTHYROXINE SODIUM 50 MCG PO TABS: ORAL_TABLET | ORAL | 0 refills | Status: DC

## 2022-05-18 ENCOUNTER — Encounter: Payer: Self-pay | Admitting: Internal Medicine

## 2022-05-19 MED ORDER — HYDROCHLOROTHIAZIDE 12.5 MG PO CAPS: 12.5000 mg | ORAL_CAPSULE | Freq: Every day | ORAL | 3 refills | Status: DC

## 2022-05-19 MED ORDER — METOPROLOL SUCCINATE ER 50 MG PO TB24: ORAL_TABLET | ORAL | 3 refills | Status: DC

## 2022-05-19 MED ORDER — TELMISARTAN 80 MG PO TABS: 80.0000 mg | ORAL_TABLET | Freq: Every day | ORAL | 3 refills | Status: DC

## 2022-06-02 ENCOUNTER — Ambulatory Visit (INDEPENDENT_AMBULATORY_CARE_PROVIDER_SITE_OTHER): Payer: PPO | Admitting: Internal Medicine

## 2022-06-02 VITALS — BP 130/76 | HR 70 | Temp 97.8°F | Ht 64.0 in | Wt 223.0 lb

## 2022-06-02 DIAGNOSIS — E538 Deficiency of other specified B group vitamins: Secondary | ICD-10-CM

## 2022-06-02 DIAGNOSIS — Z0001 Encounter for general adult medical examination with abnormal findings: Secondary | ICD-10-CM

## 2022-06-02 DIAGNOSIS — E1165 Type 2 diabetes mellitus with hyperglycemia: Secondary | ICD-10-CM | POA: Diagnosis not present

## 2022-06-02 DIAGNOSIS — E7800 Pure hypercholesterolemia, unspecified: Secondary | ICD-10-CM

## 2022-06-02 DIAGNOSIS — I10 Essential (primary) hypertension: Secondary | ICD-10-CM

## 2022-06-02 DIAGNOSIS — E559 Vitamin D deficiency, unspecified: Secondary | ICD-10-CM

## 2022-06-02 DIAGNOSIS — M79669 Pain in unspecified lower leg: Secondary | ICD-10-CM | POA: Diagnosis not present

## 2022-06-02 DIAGNOSIS — M7989 Other specified soft tissue disorders: Secondary | ICD-10-CM

## 2022-06-02 LAB — CBC WITH DIFFERENTIAL/PLATELET
Basophils Absolute: 0 10*3/uL (ref 0.0–0.1)
Basophils Relative: 1 % (ref 0.0–3.0)
Eosinophils Absolute: 0.1 10*3/uL (ref 0.0–0.7)
Eosinophils Relative: 1.4 % (ref 0.0–5.0)
HCT: 35.7 % — ABNORMAL LOW (ref 36.0–46.0)
Hemoglobin: 11 g/dL — ABNORMAL LOW (ref 12.0–15.0)
Lymphocytes Relative: 48.7 % — ABNORMAL HIGH (ref 12.0–46.0)
Lymphs Abs: 2.5 10*3/uL (ref 0.7–4.0)
MCHC: 30.9 g/dL (ref 30.0–36.0)
MCV: 77.3 fl — ABNORMAL LOW (ref 78.0–100.0)
Monocytes Absolute: 0.5 10*3/uL (ref 0.1–1.0)
Monocytes Relative: 8.8 % (ref 3.0–12.0)
Neutro Abs: 2.1 10*3/uL (ref 1.4–7.7)
Neutrophils Relative %: 40.1 % — ABNORMAL LOW (ref 43.0–77.0)
Platelets: 240 10*3/uL (ref 150.0–400.0)
RBC: 4.61 Mil/uL (ref 3.87–5.11)
RDW: 15.3 % (ref 11.5–15.5)
WBC: 5.1 10*3/uL (ref 4.0–10.5)

## 2022-06-02 LAB — VITAMIN B12: Vitamin B-12: 648 pg/mL (ref 211–911)

## 2022-06-02 LAB — BASIC METABOLIC PANEL
BUN: 16 mg/dL (ref 6–23)
CO2: 28 mEq/L (ref 19–32)
Calcium: 9.2 mg/dL (ref 8.4–10.5)
Chloride: 102 mEq/L (ref 96–112)
Creatinine, Ser: 0.94 mg/dL (ref 0.40–1.20)
GFR: 63.77 mL/min (ref >60.00)
Glucose, Bld: 92 mg/dL (ref 70–99)
Potassium: 4.2 mEq/L (ref 3.5–5.1)
Sodium: 137 mEq/L (ref 135–145)

## 2022-06-02 LAB — URINALYSIS, ROUTINE W REFLEX MICROSCOPIC
Bilirubin Urine: NEGATIVE
Hgb urine dipstick: NEGATIVE
Ketones, ur: NEGATIVE
Nitrite: NEGATIVE
RBC / HPF: NONE SEEN (ref 0–?)
Specific Gravity, Urine: 1.02 (ref 1.000–1.030)
Total Protein, Urine: NEGATIVE
Urine Glucose: NEGATIVE
Urobilinogen, UA: 1 (ref 0.0–1.0)
pH: 6.5 (ref 5.0–8.0)

## 2022-06-02 LAB — BRAIN NATRIURETIC PEPTIDE: Pro B Natriuretic peptide (BNP): 9 pg/mL (ref 0.0–100.0)

## 2022-06-02 LAB — HEPATIC FUNCTION PANEL
ALT: 15 U/L (ref 0–35)
AST: 21 U/L (ref 0–37)
Albumin: 4 g/dL (ref 3.5–5.2)
Alkaline Phosphatase: 46 U/L (ref 39–117)
Bilirubin, Direct: 0.1 mg/dL (ref 0.0–0.3)
Total Bilirubin: 0.4 mg/dL (ref 0.2–1.2)
Total Protein: 7.2 g/dL (ref 6.0–8.3)

## 2022-06-02 LAB — MICROALBUMIN / CREATININE URINE RATIO
Creatinine,U: 114.8 mg/dL
Microalb Creat Ratio: 0.6 mg/g (ref 0.0–30.0)
Microalb, Ur: <0.7 mg/dL (ref 0.0–1.9)

## 2022-06-02 LAB — LIPID PANEL
Cholesterol: 167 mg/dL (ref 0–200)
HDL: 66.2 mg/dL (ref >39.00)
LDL Cholesterol: 84 mg/dL (ref 0–99)
NonHDL: 101.06
Total CHOL/HDL Ratio: 3
Triglycerides: 87 mg/dL (ref 0.0–149.0)
VLDL: 17.4 mg/dL (ref 0.0–40.0)

## 2022-06-02 LAB — TSH: TSH: 3.33 u[IU]/mL (ref 0.35–5.50)

## 2022-06-02 LAB — VITAMIN D 25 HYDROXY (VIT D DEFICIENCY, FRACTURES): VITD: 59.81 ng/mL (ref 30.00–100.00)

## 2022-06-02 LAB — HEMOGLOBIN A1C: Hgb A1c MFr Bld: 5.7 % (ref 4.6–6.5)

## 2022-06-02 MED ORDER — HYDROCHLOROTHIAZIDE 25 MG PO TABS: 25.0000 mg | ORAL_TABLET | Freq: Every day | ORAL | 3 refills | Status: DC

## 2022-06-02 NOTE — Patient Instructions (Signed)
Ok to increase the HCT to 25 mg per day  Please continue all other medications as before, and refills have been done if requested.  Please have the pharmacy call with any other refills you may need.  Please continue your efforts at being more active, low cholesterol diet, and weight control.  You are otherwise up to date with prevention measures today.  Please keep your appointments with your specialists as you may have planned  You will be contacted regarding the referral for: Venous doppler for the legs  Please go to the LAB at the blood drawing area for the tests to be done  You will be contacted by phone if any changes need to be made immediately.  Otherwise, you will receive a letter about your results with an explanation, but please check with MyChart first.  Please remember to sign up for MyChart if you have not done so, as this will be important to you in the future with finding out test results, communicating by private email, and scheduling acute appointments online when needed.  Please make an Appointment to return in 6 months, or sooner if needed
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
## 2022-06-04 ENCOUNTER — Encounter: Payer: Self-pay | Admitting: Internal Medicine

## 2022-06-04 DIAGNOSIS — M79669 Pain in unspecified lower leg: Secondary | ICD-10-CM | POA: Insufficient documentation

## 2022-06-04 DIAGNOSIS — R609 Edema, unspecified: Secondary | ICD-10-CM | POA: Insufficient documentation

## 2022-06-04 NOTE — Assessment & Plan Note (Signed)
Lab Results  Component Value Date   LDLCALC 84 06/02/2022   Stable, pt to continue current low chol diet, declines statin

## 2022-06-04 NOTE — Assessment & Plan Note (Signed)
Lab Results  Component Value Date   HGBA1C 5.7 06/02/2022   Stable, pt to continue current medical treatment  - diet, wt control, excercsie

## 2022-06-04 NOTE — Assessment & Plan Note (Signed)
Age and sex appropriate education and counseling updated with regular exercise and diet Referrals for preventative services - none needed Immunizations addressed - declines flu shot, covid booster Smoking counseling  - none needed Evidence for depression or other mood disorder - none significant Most recent labs reviewed. I have personally reviewed and have noted: 1) the patient's medical and social history 2) The patient's current medications and supplements 3) The patient's height, weight, and BMI have been recorded in the chart

## 2022-06-04 NOTE — Assessment & Plan Note (Addendum)
Mild to mod, etiology unclear, for icnreased hct 25 mg qd, check venous dopplers r/o dvt, BNP with labs, and advised for compression stockings during daytime,  to f/u any worsening symptoms or concerns

## 2022-06-04 NOTE — Assessment & Plan Note (Signed)
Last vitamin D Lab Results  Component Value Date   VD25OH 59.81 06/02/2022   Stable, cont oral replacement

## 2022-06-04 NOTE — Assessment & Plan Note (Signed)
Lab Results  Component Value Date   VITAMINB12 648 06/02/2022   Stable, cont oral replacement - b12 1000 mcg qd

## 2022-06-04 NOTE — Assessment & Plan Note (Signed)
BP Readings from Last 3 Encounters:  06/02/22 130/76  02/23/22 132/79  01/05/22 138/80   Borderline control, pt to continue medical treatment norvasc 10 qd, micardis 80 mg, toprol xl 50 mg qd, and increased hct to 25 mg qd

## 2022-06-06 ENCOUNTER — Ambulatory Visit: Payer: Medicare Other | Admitting: Internal Medicine

## 2022-06-10 ENCOUNTER — Other Ambulatory Visit: Payer: Self-pay | Admitting: Internal Medicine

## 2022-06-10 ENCOUNTER — Ambulatory Visit (HOSPITAL_COMMUNITY)
Admission: RE | Admit: 2022-06-10 | Discharge: 2022-06-10 | Disposition: A | Discharge status: Home / Self Care | Payer: PPO | Source: Ambulatory Visit | Attending: Internal Medicine | Admitting: Internal Medicine

## 2022-06-10 DIAGNOSIS — M79661 Pain in right lower leg: Secondary | ICD-10-CM | POA: Diagnosis not present

## 2022-06-10 DIAGNOSIS — E559 Vitamin D deficiency, unspecified: Secondary | ICD-10-CM

## 2022-06-10 DIAGNOSIS — M79669 Pain in unspecified lower leg: Secondary | ICD-10-CM | POA: Diagnosis present

## 2022-06-10 DIAGNOSIS — E538 Deficiency of other specified B group vitamins: Secondary | ICD-10-CM

## 2022-06-10 DIAGNOSIS — M7989 Other specified soft tissue disorders: Secondary | ICD-10-CM | POA: Insufficient documentation

## 2022-06-10 DIAGNOSIS — E7800 Pure hypercholesterolemia, unspecified: Secondary | ICD-10-CM

## 2022-06-10 DIAGNOSIS — I10 Essential (primary) hypertension: Secondary | ICD-10-CM

## 2022-06-10 DIAGNOSIS — Z0001 Encounter for general adult medical examination with abnormal findings: Secondary | ICD-10-CM

## 2022-06-10 DIAGNOSIS — M79662 Pain in left lower leg: Secondary | ICD-10-CM

## 2022-06-10 DIAGNOSIS — E1165 Type 2 diabetes mellitus with hyperglycemia: Secondary | ICD-10-CM

## 2022-07-04 ENCOUNTER — Ambulatory Visit (INDEPENDENT_AMBULATORY_CARE_PROVIDER_SITE_OTHER): Payer: PPO

## 2022-07-04 ENCOUNTER — Telehealth: Payer: Self-pay

## 2022-07-04 VITALS — Ht 64.0 in | Wt 223.0 lb

## 2022-07-04 DIAGNOSIS — Z0000 Encounter for general adult medical examination without abnormal findings: Secondary | ICD-10-CM

## 2022-07-04 DIAGNOSIS — Z13820 Encounter for screening for osteoporosis: Secondary | ICD-10-CM | POA: Diagnosis not present
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
## 2022-07-04 NOTE — Telephone Encounter (Signed)
AWV was completed.

## 2022-07-04 NOTE — Patient Instructions (Addendum)
Elizabeth Hawkins , Thank you for taking time to come for your Medicare Wellness Visit. I appreciate your ongoing commitment to your health goals. Please review the following plan we discussed and let me know if I can assist you in the future.   These are the goals we discussed:  Goals      Client understands the importance of follow-up with providers by attending scheduled visits        This is a list of the screening recommended for you and due dates:  Health Maintenance  Topic Date Due   Flu Shot  11/06/2022*   COVID-19 Vaccine (7 - 2023-24 season) 08/03/2022   Eye exam for diabetics  08/17/2022   Hemoglobin A1C  12/02/2022   Colon Cancer Screening  03/21/2023   Yearly kidney function blood test for diabetes  06/03/2023   Yearly kidney health urinalysis for diabetes  06/03/2023   Complete foot exam   06/03/2023   Medicare Annual Wellness Visit  07/05/2023   Pneumonia Vaccine (3 - PPSV23 or PCV20) 10/23/2023   DEXA scan (bone density measurement)  Completed   Hepatitis C Screening: USPSTF Recommendation to screen - Ages 41-65 yo.  Completed   HIV Screening  Completed   Zoster (Shingles) Vaccine  Completed   HPV Vaccine  Aged Out   Mammogram  Discontinued  *Topic was postponed. The date shown is not the original due date.   Immunization History  Administered Date(s) Administered   Influenza,inj,Quad PF,6+ Mos 06/12/2014   Influenza-Unspecified 06/08/2017   PFIZER(Purple Top)SARS-COV-2 Vaccination 10/16/2019, 11/06/2019, 05/14/2020, 12/27/2020, 06/08/2022   Pneumococcal Conjugate-13 10/04/2017   Pneumococcal Polysaccharide-23 10/23/2018   Respiratory Syncytial Virus Vaccine,Recomb Aduvanted(Arexvy) 06/08/2022   Td 02/06/2004   Tdap 08/09/2016   Zoster Recombinat (Shingrix) 04/22/2021, 06/21/2021    Advanced directives: Yes  Conditions/risks identified: Yes  Next appointment: Follow up in one year for your annual wellness visit.   Preventive Care 65 Years and Older,  Female Preventive care refers to lifestyle choices and visits with your health care provider that can promote health and wellness. What does preventive care include? A yearly physical exam. This is also called an annual well check. Dental exams once or twice a year. Routine eye exams. Ask your health care provider how often you should have your eyes checked. Personal lifestyle choices, including: Daily care of your teeth and gums. Regular physical activity. Eating a healthy diet. Avoiding tobacco and drug use. Limiting alcohol use. Practicing safe sex. Taking low-dose aspirin every day. Taking vitamin and mineral supplements as recommended by your health care provider. What happens during an annual well check? The services and screenings done by your health care provider during your annual well check will depend on your age, overall health, lifestyle risk factors, and family history of disease. Counseling  Your health care provider may ask you questions about your: Alcohol use. Tobacco use. Drug use. Emotional well-being. Home and relationship well-being. Sexual activity. Eating habits. History of falls. Memory and ability to understand (cognition). Work and work Statistician. Reproductive health. Screening  You may have the following tests or measurements: Height, weight, and BMI. Blood pressure. Lipid and cholesterol levels. These may be checked every 5 years, or more frequently if you are over 34 years old. Skin check. Lung cancer screening. You may have this screening every year starting at age 12 if you have a 30-pack-year history of smoking and currently smoke or have quit within the past 15 years. Fecal occult blood test (FOBT) of the  stool. You may have this test every year starting at age 58. Flexible sigmoidoscopy or colonoscopy. You may have a sigmoidoscopy every 5 years or a colonoscopy every 10 years starting at age 109. Hepatitis C blood test. Hepatitis B blood  test. Sexually transmitted disease (STD) testing. Diabetes screening. This is done by checking your blood sugar (glucose) after you have not eaten for a while (fasting). You may have this done every 1-3 years. Bone density scan. This is done to screen for osteoporosis. You may have this done starting at age 46. Mammogram. This may be done every 1-2 years. Talk to your health care provider about how often you should have regular mammograms. Talk with your health care provider about your test results, treatment options, and if necessary, the need for more tests. Vaccines  Your health care provider may recommend certain vaccines, such as: Influenza vaccine. This is recommended every year. Tetanus, diphtheria, and acellular pertussis (Tdap, Td) vaccine. You may need a Td booster every 10 years. Zoster vaccine. You may need this after age 55. Pneumococcal 13-valent conjugate (PCV13) vaccine. One dose is recommended after age 43. Pneumococcal polysaccharide (PPSV23) vaccine. One dose is recommended after age 77. Talk to your health care provider about which screenings and vaccines you need and how often you need them. This information is not intended to replace advice given to you by your health care provider. Make sure you discuss any questions you have with your health care provider. Document Released: 08/21/2015 Document Revised: 04/13/2016 Document Reviewed: 05/26/2015 Elsevier Interactive Patient Education  2017 [employer] Prevention in the Home Falls can cause injuries. They can happen to people of all ages. There are many things you can do to make your home safe and to help prevent falls. What can I do on the outside of my home? Regularly fix the edges of walkways and driveways and fix any cracks. Remove anything that might make you trip as you walk through a door, such as a raised step or threshold. Trim any bushes or trees on the path to your home. Use bright outdoor  lighting. Clear any walking paths of anything that might make someone trip, such as rocks or tools. Regularly check to see if handrails are loose or broken. Make sure that both sides of any steps have handrails. Any raised decks and porches should have guardrails on the edges. Have any leaves, snow, or ice cleared regularly. Use sand or salt on walking paths during winter. Clean up any spills in your garage right away. This includes oil or grease spills. What can I do in the bathroom? Use night lights. Install grab bars by the toilet and in the tub and shower. Do not use towel bars as grab bars. Use non-skid mats or decals in the tub or shower. If you need to sit down in the shower, use a plastic, non-slip stool. Keep the floor dry. Clean up any water that spills on the floor as soon as it happens. Remove soap buildup in the tub or shower regularly. Attach bath mats securely with double-sided non-slip rug tape. Do not have throw rugs and other things on the floor that can make you trip. What can I do in the bedroom? Use night lights. Make sure that you have a light by your bed that is easy to reach. Do not use any sheets or blankets that are too big for your bed. They should not hang down onto the floor. Have a firm chair that  has side arms. You can use this for support while you get dressed. Do not have throw rugs and other things on the floor that can make you trip. What can I do in the kitchen? Clean up any spills right away. Avoid walking on wet floors. Keep items that you use a lot in easy-to-reach places. If you need to reach something above you, use a strong step stool that has a grab bar. Keep electrical cords out of the way. Do not use floor polish or wax that makes floors slippery. If you must use wax, use non-skid floor wax. Do not have throw rugs and other things on the floor that can make you trip. What can I do with my stairs? Do not leave any items on the stairs. Make  sure that there are handrails on both sides of the stairs and use them. Fix handrails that are broken or loose. Make sure that handrails are as long as the stairways. Check any carpeting to make sure that it is firmly attached to the stairs. Fix any carpet that is loose or worn. Avoid having throw rugs at the top or bottom of the stairs. If you do have throw rugs, attach them to the floor with carpet tape. Make sure that you have a light switch at the top of the stairs and the bottom of the stairs. If you do not have them, ask someone to add them for you. What else can I do to help prevent falls? Wear shoes that: Do not have high heels. Have rubber bottoms. Are comfortable and fit you well. Are closed at the toe. Do not wear sandals. If you use a stepladder: Make sure that it is fully opened. Do not climb a closed stepladder. Make sure that both sides of the stepladder are locked into place. Ask someone to hold it for you, if possible. Clearly mark and make sure that you can see: Any grab bars or handrails. First and last steps. Where the edge of each step is. Use tools that help you move around (mobility aids) if they are needed. These include: Canes. Walkers. Scooters. Crutches. Turn on the lights when you go into a dark area. Replace any light bulbs as soon as they burn out. Set up your furniture so you have a clear path. Avoid moving your furniture around. If any of your floors are uneven, fix them. If there are any pets around you, be aware of where they are. Review your medicines with your doctor. Some medicines can make you feel dizzy. This can increase your chance of falling. Ask your doctor what other things that you can do to help prevent falls. This information is not intended to replace advice given to you by your health care provider. Make sure you discuss any questions you have with your health care provider. Document Released: 05/21/2009 Document Revised: 12/31/2015  Document Reviewed: 08/29/2014 Elsevier Interactive Patient Education  2017 [employer].

## 2022-08-04 ENCOUNTER — Other Ambulatory Visit: Payer: Self-pay | Admitting: Internal Medicine

## 2022-08-04 NOTE — Telephone Encounter (Signed)
Please refill as per office routine med refill policy (all routine meds to be refilled for 3 mo or monthly (per pt preference) up to one year from last visit, then month to month grace period for 3 mo, then further med refills will have to be denied) ? ?

## 2022-08-15 ENCOUNTER — Other Ambulatory Visit: Payer: Self-pay | Admitting: Internal Medicine

## 2022-08-15 NOTE — Telephone Encounter (Signed)
Please refill as per office routine med refill policy (all routine meds to be refilled for 3 mo or monthly (per pt preference) up to one year from last visit, then month to month grace period for 3 mo, then further med refills will have to be denied) ? ?

## 2022-10-14 ENCOUNTER — Ambulatory Visit
Admission: EM | Admit: 2022-10-14 | Discharge: 2022-10-14 | Disposition: A | Discharge status: Home / Self Care | Payer: PPO | Attending: Internal Medicine | Admitting: Internal Medicine

## 2022-10-14 ENCOUNTER — Encounter: Payer: Self-pay | Admitting: Internal Medicine

## 2022-10-14 DIAGNOSIS — R519 Headache, unspecified: Secondary | ICD-10-CM | POA: Diagnosis not present

## 2022-10-14 MED ORDER — METHYLPREDNISOLONE ACETATE 80 MG/ML IJ SUSP
80.0000 mg | Freq: Once | INTRAMUSCULAR | Status: AC
  Administered 2022-10-14: 80 mg via INTRAMUSCULAR

## 2022-10-14 NOTE — Discharge Instructions (Signed)
You were given a steroid shot today in urgent care to help alleviate your headache.  May take Tylenol as well.  Follow-up with ER in the next 24 to 48 hours if it does not improve.

## 2022-10-14 NOTE — ED Triage Notes (Signed)
Pt states frontal headache for the past 3 days has been taking Tylenol at home for the pain.
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
## 2022-10-26 ENCOUNTER — Ambulatory Visit (INDEPENDENT_AMBULATORY_CARE_PROVIDER_SITE_OTHER): Payer: PPO | Admitting: Internal Medicine

## 2022-10-26 VITALS — BP 128/76 | HR 83 | Temp 98.4°F | Ht 64.0 in | Wt 206.0 lb

## 2022-10-26 DIAGNOSIS — R519 Headache, unspecified: Secondary | ICD-10-CM

## 2022-10-26 DIAGNOSIS — E559 Vitamin D deficiency, unspecified: Secondary | ICD-10-CM | POA: Diagnosis not present

## 2022-10-26 DIAGNOSIS — E1165 Type 2 diabetes mellitus with hyperglycemia: Secondary | ICD-10-CM

## 2022-10-26 DIAGNOSIS — I10 Essential (primary) hypertension: Secondary | ICD-10-CM

## 2022-10-26 DIAGNOSIS — E538 Deficiency of other specified B group vitamins: Secondary | ICD-10-CM

## 2022-10-26 DIAGNOSIS — J309 Allergic rhinitis, unspecified: Secondary | ICD-10-CM | POA: Diagnosis not present

## 2022-10-26 DIAGNOSIS — Z0001 Encounter for general adult medical examination with abnormal findings: Secondary | ICD-10-CM

## 2022-10-26 DIAGNOSIS — E7800 Pure hypercholesterolemia, unspecified: Secondary | ICD-10-CM | POA: Insufficient documentation

## 2022-10-26 LAB — LIPID PANEL
Cholesterol: 190 mg/dL (ref 0–200)
HDL: 69.6 mg/dL (ref >39.00)
LDL Cholesterol: 100 mg/dL — ABNORMAL HIGH (ref 0–99)
NonHDL: 120.01
Total CHOL/HDL Ratio: 3
Triglycerides: 99 mg/dL (ref 0.0–149.0)
VLDL: 19.8 mg/dL (ref 0.0–40.0)

## 2022-10-26 LAB — CBC WITH DIFFERENTIAL/PLATELET
Basophils Absolute: 0.1 10*3/uL (ref 0.0–0.1)
Basophils Relative: 1 % (ref 0.0–3.0)
Eosinophils Absolute: 0.1 10*3/uL (ref 0.0–0.7)
Eosinophils Relative: 2.4 % (ref 0.0–5.0)
HCT: 37 % (ref 36.0–46.0)
Hemoglobin: 11.6 g/dL — ABNORMAL LOW (ref 12.0–15.0)
Lymphocytes Relative: 43.1 % (ref 12.0–46.0)
Lymphs Abs: 2.2 10*3/uL (ref 0.7–4.0)
MCHC: 31.3 g/dL (ref 30.0–36.0)
MCV: 75.4 fl — ABNORMAL LOW (ref 78.0–100.0)
Monocytes Absolute: 0.4 10*3/uL (ref 0.1–1.0)
Monocytes Relative: 8.2 % (ref 3.0–12.0)
Neutro Abs: 2.3 10*3/uL (ref 1.4–7.7)
Neutrophils Relative %: 45.3 % (ref 43.0–77.0)
Platelets: 266 10*3/uL (ref 150.0–400.0)
RBC: 4.91 Mil/uL (ref 3.87–5.11)
RDW: 14.7 % (ref 11.5–15.5)
WBC: 5 10*3/uL (ref 4.0–10.5)

## 2022-10-26 LAB — MICROALBUMIN / CREATININE URINE RATIO
Creatinine,U: 160.6 mg/dL
Microalb Creat Ratio: 0.6 mg/g (ref 0.0–30.0)
Microalb, Ur: 0.9 mg/dL (ref 0.0–1.9)

## 2022-10-26 LAB — BASIC METABOLIC PANEL
BUN: 15 mg/dL (ref 6–23)
CO2: 27 mEq/L (ref 19–32)
Calcium: 10 mg/dL (ref 8.4–10.5)
Chloride: 102 mEq/L (ref 96–112)
Creatinine, Ser: 0.92 mg/dL (ref 0.40–1.20)
GFR: 65.26 mL/min (ref >60.00)
Glucose, Bld: 80 mg/dL (ref 70–99)
Potassium: 4.2 mEq/L (ref 3.5–5.1)
Sodium: 138 mEq/L (ref 135–145)

## 2022-10-26 LAB — VITAMIN B12: Vitamin B-12: 666 pg/mL (ref 211–911)

## 2022-10-26 LAB — HEPATIC FUNCTION PANEL
ALT: 19 U/L (ref 0–35)
AST: 21 U/L (ref 0–37)
Albumin: 4.2 g/dL (ref 3.5–5.2)
Alkaline Phosphatase: 44 U/L (ref 39–117)
Bilirubin, Direct: 0.1 mg/dL (ref 0.0–0.3)
Total Bilirubin: 0.3 mg/dL (ref 0.2–1.2)
Total Protein: 7.8 g/dL (ref 6.0–8.3)

## 2022-10-26 LAB — VITAMIN D 25 HYDROXY (VIT D DEFICIENCY, FRACTURES): VITD: 57 ng/mL (ref 30.00–100.00)

## 2022-10-26 LAB — HEMOGLOBIN A1C: Hgb A1c MFr Bld: 5.6 % (ref 4.6–6.5)

## 2022-10-26 LAB — TSH: TSH: 2.36 u[IU]/mL (ref 0.35–5.50)

## 2022-10-26 MED ORDER — AMLODIPINE BESYLATE 10 MG PO TABS: 10.0000 mg | ORAL_TABLET | Freq: Every day | ORAL | 3 refills | Status: DC

## 2022-10-26 MED ORDER — LEVOTHYROXINE SODIUM 50 MCG PO TABS: ORAL_TABLET | ORAL | 3 refills | Status: DC
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
## 2022-10-26 NOTE — Patient Instructions (Addendum)
Please continue all other medications as before, but also take Allegra 180 mg per day and Nasacort for allergies if the headache returns   Please have the pharmacy call with any other refills you may need.  Please continue your efforts at being more active, low cholesterol diet, and weight control.  You are otherwise up to date with prevention measures today.  Please keep your appointments with your specialists as you may have planned  Please go to the LAB at the blood drawing area for the tests to be done  You will be contacted by phone if any changes need to be made immediately.  Otherwise, you will receive a letter about your results with an explanation, but please check with MyChart first.  Please remember to sign up for MyChart if you have not done so, as this will be important to you in the future with finding out test results, communicating by private email, and scheduling acute appointments online when needed.  Please make an Appointment to return in 6 months, or sooner if needed

## 2022-10-27 LAB — URINALYSIS, ROUTINE W REFLEX MICROSCOPIC
Bilirubin Urine: NEGATIVE
Hgb urine dipstick: NEGATIVE
Ketones, ur: NEGATIVE
Nitrite: NEGATIVE
Specific Gravity, Urine: >=1.03 — AB (ref 1.000–1.030)
Total Protein, Urine: NEGATIVE
Urine Glucose: NEGATIVE
Urobilinogen, UA: 0.2 (ref 0.0–1.0)
pH: 5.5 (ref 5.0–8.0)

## 2022-10-29 ENCOUNTER — Encounter: Payer: Self-pay | Admitting: Internal Medicine

## 2022-10-29 NOTE — Assessment & Plan Note (Signed)
D/w pt, no evidence for persistent pain  or recurring other such as migraine - ok to hold on neuro referral for now

## 2022-10-29 NOTE — Assessment & Plan Note (Signed)
BP Readings from Last 3 Encounters:  10/26/22 128/76  10/14/22 120/82  06/02/22 130/76   Stable, pt to continue medical treatment hct 25 qd, norvasc 10 qd, micardis 80 mg qd, toprol xl 50 qd

## 2022-10-29 NOTE — Assessment & Plan Note (Signed)
Age and sex appropriate education and counseling updated with regular exercise and diet Referrals for preventative services - none needed Immunizations addressed - declines flu shot and covid booster Smoking counseling  - none needed Evidence for depression or other mood disorder - none significant Most recent labs reviewed. I have personally reviewed and have noted: 1) the patient's medical and social history 2) The patient's current medications and supplements 3) The patient's height, weight, and BMI have been recorded in the chart  

## 2022-10-29 NOTE — Assessment & Plan Note (Signed)
Last vitamin D Lab Results  Component Value Date   VD25OH 57.00 10/26/2022   Stable, cont oral replacement

## 2022-10-29 NOTE — Assessment & Plan Note (Signed)
Lab Results  Component Value Date   [id number] 10/26/2022   Stable, cont oral replacement - b12 1000 mcg qd

## 2022-10-29 NOTE — Assessment & Plan Note (Signed)
Lab Results  Component Value Date   HGBA1C 5.6 10/26/2022   Stable, pt to continue current medical treatment  - diet,wt control

## 2022-10-29 NOTE — Assessment & Plan Note (Signed)
With recent headache and congestion, improved with steroid tx; ok to continue maintenance with allegra and nasacort asd

## 2022-10-31 ENCOUNTER — Encounter: Payer: Self-pay | Admitting: Internal Medicine

## 2022-11-16 ENCOUNTER — Other Ambulatory Visit: Payer: Self-pay | Admitting: Internal Medicine

## 2022-11-30 ENCOUNTER — Encounter: Payer: Self-pay | Admitting: Emergency Medicine

## 2022-11-30 ENCOUNTER — Ambulatory Visit (INDEPENDENT_AMBULATORY_CARE_PROVIDER_SITE_OTHER): Payer: PPO | Admitting: Emergency Medicine

## 2022-11-30 VITALS — BP 130/76 | HR 80 | Temp 98.4°F | Ht 64.0 in | Wt 196.2 lb

## 2022-11-30 DIAGNOSIS — R519 Headache, unspecified: Secondary | ICD-10-CM | POA: Diagnosis not present

## 2022-11-30 MED ORDER — TRAMADOL HCL 50 MG PO TABS: 50.0000 mg | ORAL_TABLET | Freq: Three times a day (TID) | ORAL | 0 refills | Status: AC | PRN

## 2022-11-30 NOTE — Assessment & Plan Note (Signed)
Non-intractable episodic headache. Clinically stable.  No red flag signs or symptoms. Of fairly recent onset about 3 weeks ago Cannot take NSAIDs.  Only takes Tylenol Normal and benign examination with normal neurological examination Recommend to continue Tylenol for mild to moderate headache and tramadol for moderate to severe headache Recommend neurology evaluation.  Referral placed today. ED precautions given. Advised to contact the office if no better or worse during the next several days.
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
## 2022-11-30 NOTE — Patient Instructions (Signed)
General Headache Without Cause A headache is pain or discomfort you feel around the head or neck area. There are many causes and types of headaches. In some cases, the cause may not be found. Follow these instructions at home: Watch your condition for any changes. Let your doctor know about them. Take these steps to help with your condition: Managing pain     Take over-the-counter and prescription medicines only as told by your doctor. This includes medicines for pain that are taken by mouth or put on the skin. Lie down in a dark, quiet room when you have a headache. If told, put ice on your head and neck area: Put ice in a plastic bag. Place a towel between your skin and the bag. Leave the ice on for 20 minutes, 2-3 times per day. Take off the ice if your skin turns bright red. This is very important. If you cannot feel pain, heat, or cold, you have a greater risk of damage to the area. If told, put heat on the affected area. Use the heat source that your doctor recommends, such as a moist heat pack or a heating pad. Place a towel between your skin and the heat source. Leave the heat on for 20-30 minutes. Take off the heat if your skin turns bright red. This is very important. If you cannot feel pain, heat, or cold, you have a greater risk of getting burned. Keep lights dim if bright lights bother you or make your headaches worse. Eating and drinking Eat meals on a regular schedule. If you drink alcohol: Limit how much you have to: 0-1 drink a day for women who are not pregnant. 0-2 drinks a day for men. Know how much alcohol is in a drink. In the [place] one drink equals one 12 oz bottle of beer (355 mL), one 5 oz glass of wine (148 mL), or one 1 oz glass of hard liquor (44 mL). Stop drinking caffeine, or drink less caffeine. General instructions  Keep a journal to find out if certain things bring on headaches. For example, write down: What you eat and drink. How much sleep you  get. Any change to your diet or medicines. Get a massage or try other ways to relax. Limit stress. Sit up straight. Do not tighten (tense) your muscles. Do not smoke or use any products that contain nicotine or tobacco. If you need help quitting, ask your doctor. Exercise regularly as told by your doctor. Get enough sleep. This often means 7-9 hours of sleep each night. Keep all follow-up visits. This is important. Contact a doctor if: Medicine does not help your symptoms. You have a headache that feels different than the other headaches. You feel like you may vomit (nauseous) or you vomit. You have a fever. Get help right away if: Your headache: Gets very bad quickly. Gets worse after a lot of physical activity. You have any of these symptoms: You continue to vomit. A stiff neck. Trouble seeing. Your eye or ear hurts. Trouble speaking. Weak muscles or you lose muscle control. You lose your balance or have trouble walking. You feel like you will pass out (faint) or you pass out. You are mixed up (confused). You have a seizure. These symptoms may be an emergency. Get help right away. Call your local emergency services (911 in the [place] Do not wait to see if the symptoms will go away. Do not drive yourself to the hospital. Summary A headache is pain or discomfort that   is felt around the head or neck area. There are many causes and types of headaches. In some cases, the cause may not be found. Keep a journal to help find out what causes your headaches. Watch your condition for any changes. Let your doctor know about them. Contact a doctor if you have a headache that is different from usual, or if medicine does not help your headache. Get help right away if your headache gets very bad, you throw up, you have trouble seeing, you lose your balance, or you have a seizure. This information is not intended to replace advice given to you by your health care provider. Make sure you  discuss any questions you have with your health care provider. Document Revised: 12/23/2020 Document Reviewed: 12/23/2020 Elsevier Patient Education  2023 Elsevier Inc.  

## 2022-12-05 ENCOUNTER — Encounter: Payer: Self-pay | Admitting: Emergency Medicine

## 2022-12-06 ENCOUNTER — Encounter: Payer: Self-pay | Admitting: Neurology
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
## 2023-01-02 ENCOUNTER — Other Ambulatory Visit: Payer: Self-pay | Admitting: Internal Medicine

## 2023-01-03 ENCOUNTER — Other Ambulatory Visit: Payer: Self-pay

## 2023-01-12 ENCOUNTER — Ambulatory Visit: Payer: PPO | Admitting: Neurology

## 2023-01-12 ENCOUNTER — Encounter: Payer: Self-pay | Admitting: Neurology

## 2023-01-12 VITALS — BP 129/56 | HR 75 | Ht 64.0 in | Wt 197.8 lb

## 2023-01-12 DIAGNOSIS — G4486 Cervicogenic headache: Secondary | ICD-10-CM | POA: Diagnosis not present

## 2023-01-12 DIAGNOSIS — M542 Cervicalgia: Secondary | ICD-10-CM | POA: Diagnosis not present

## 2023-01-12 DIAGNOSIS — R209 Unspecified disturbances of skin sensation: Secondary | ICD-10-CM

## 2023-01-12 NOTE — Patient Instructions (Signed)
I think your head sensation and throbbing may be coming from your neck and the neck pain. There are nerves that come out of the neck and travel to the top of your head.  To investigate further, I will get a CT of your neck (cervical spine). I will be in touch when I have those results. Please call us if you do not receive a call to schedule in the next 1-2 weeks.  For the neck pain, I will send you to physical therapy. I expect this to help with neck pain and perhaps help the head pain and abnormal sensations.  If this does not help or your symptoms worsen, please let me know as we can investigate further.  Please let me know if you have any questions or concerns in the meantime.  The physicians and staff at [facility] are committed to providing excellent care. You may receive a survey requesting feedback about your experience at our office. We strive to receive "very good" responses to the survey questions. If you feel that your experience would prevent you from giving the office a "very good " response, please contact our office to try to remedy the situation. We may be reached at [phone]. Thank you for taking the time out of your busy day to complete the survey.  Jacquelyne Balint, MD [facility]

## 2023-01-13 ENCOUNTER — Ambulatory Visit: Payer: PPO | Attending: Neurology | Admitting: Physical Therapy

## 2023-01-13 ENCOUNTER — Ambulatory Visit: Payer: PPO | Admitting: Physical Therapy

## 2023-01-13 ENCOUNTER — Other Ambulatory Visit: Payer: Self-pay

## 2023-01-13 ENCOUNTER — Encounter: Payer: Self-pay | Admitting: Physical Therapy

## 2023-01-13 DIAGNOSIS — R29898 Other symptoms and signs involving the musculoskeletal system: Secondary | ICD-10-CM | POA: Insufficient documentation

## 2023-01-13 DIAGNOSIS — R209 Unspecified disturbances of skin sensation: Secondary | ICD-10-CM | POA: Diagnosis not present

## 2023-01-13 DIAGNOSIS — G4486 Cervicogenic headache: Secondary | ICD-10-CM | POA: Diagnosis not present

## 2023-01-13 DIAGNOSIS — R293 Abnormal posture: Secondary | ICD-10-CM | POA: Insufficient documentation

## 2023-01-13 DIAGNOSIS — M542 Cervicalgia: Secondary | ICD-10-CM | POA: Insufficient documentation
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
## 2023-01-16 ENCOUNTER — Ambulatory Visit
Admission: RE | Admit: 2023-01-16 | Discharge: 2023-01-16 | Disposition: A | Discharge status: Home / Self Care | Payer: PPO | Source: Ambulatory Visit | Attending: Neurology | Admitting: Neurology

## 2023-01-16 DIAGNOSIS — M542 Cervicalgia: Secondary | ICD-10-CM

## 2023-01-16 DIAGNOSIS — G4486 Cervicogenic headache: Secondary | ICD-10-CM

## 2023-01-16 DIAGNOSIS — R209 Unspecified disturbances of skin sensation: Secondary | ICD-10-CM

## 2023-01-17 ENCOUNTER — Ambulatory Visit: Payer: PPO | Admitting: Physical Therapy

## 2023-01-17 DIAGNOSIS — R29898 Other symptoms and signs involving the musculoskeletal system: Secondary | ICD-10-CM

## 2023-01-17 DIAGNOSIS — M542 Cervicalgia: Secondary | ICD-10-CM

## 2023-01-17 DIAGNOSIS — R293 Abnormal posture: Secondary | ICD-10-CM
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
## 2023-01-18 ENCOUNTER — Ambulatory Visit: Payer: PPO | Admitting: Physical Therapy

## 2023-01-18 ENCOUNTER — Encounter: Payer: Self-pay | Admitting: Physical Therapy

## 2023-01-18 DIAGNOSIS — M542 Cervicalgia: Secondary | ICD-10-CM | POA: Diagnosis not present

## 2023-01-18 DIAGNOSIS — R293 Abnormal posture: Secondary | ICD-10-CM

## 2023-01-18 DIAGNOSIS — R29898 Other symptoms and signs involving the musculoskeletal system: Secondary | ICD-10-CM
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
## 2023-01-25 ENCOUNTER — Ambulatory Visit: Payer: PPO | Admitting: Physical Therapy

## 2023-01-26 ENCOUNTER — Encounter: Payer: Self-pay | Admitting: Neurology

## 2023-01-27 ENCOUNTER — Telehealth: Payer: Self-pay | Admitting: Neurology

## 2023-01-27 ENCOUNTER — Ambulatory Visit: Payer: PPO | Admitting: Physical Therapy

## 2023-01-27 DIAGNOSIS — R293 Abnormal posture: Secondary | ICD-10-CM

## 2023-01-27 DIAGNOSIS — R29898 Other symptoms and signs involving the musculoskeletal system: Secondary | ICD-10-CM

## 2023-01-27 DIAGNOSIS — M542 Cervicalgia: Secondary | ICD-10-CM | POA: Diagnosis not present
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
## 2023-01-27 NOTE — Telephone Encounter (Signed)
Called patient and informed her of Dr. Jorge Ny response about her MRI below: I tried to call her yesterday and sent this MyChart message to her: "I reviewed your MRI of the neck (cervical spine). It did show some tight spots that could explain the symptoms in your neck and arms. It was not severe where you might consider surgery though. I would continue physical therapy and see if this helps open those spaces and helps with your symptoms. Please let me know if you have any questions or concerns in the meantime."  Patient stated that if she thinks of any questions she will contact Dr. Loleta Chance. Patient verbalized understanding and had no further questions or concerns.

## 2023-01-27 NOTE — Telephone Encounter (Signed)
Left message with the after hour service on 01-26-23 at 4:46 pm   Caller states that she is returning a call to the office about results

## 2023-01-30 ENCOUNTER — Ambulatory Visit: Payer: PPO | Admitting: Physical Therapy

## 2023-01-30 DIAGNOSIS — M542 Cervicalgia: Secondary | ICD-10-CM

## 2023-01-30 DIAGNOSIS — R293 Abnormal posture: Secondary | ICD-10-CM

## 2023-01-30 DIAGNOSIS — R29898 Other symptoms and signs involving the musculoskeletal system: Secondary | ICD-10-CM
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
## 2023-02-02 ENCOUNTER — Ambulatory Visit: Payer: PPO | Admitting: Physical Therapy

## 2023-02-03 ENCOUNTER — Ambulatory Visit: Payer: PPO | Admitting: Physical Therapy

## 2023-02-03 DIAGNOSIS — M542 Cervicalgia: Secondary | ICD-10-CM

## 2023-02-03 DIAGNOSIS — R29898 Other symptoms and signs involving the musculoskeletal system: Secondary | ICD-10-CM

## 2023-02-03 DIAGNOSIS — R293 Abnormal posture: Secondary | ICD-10-CM
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
## 2023-03-03 ENCOUNTER — Other Ambulatory Visit: Payer: Self-pay

## 2023-03-03 ENCOUNTER — Encounter: Payer: Self-pay | Admitting: Neurology

## 2023-03-03 DIAGNOSIS — R209 Unspecified disturbances of skin sensation: Secondary | ICD-10-CM

## 2023-03-22 ENCOUNTER — Other Ambulatory Visit: Payer: Self-pay

## 2023-03-22 LAB — HM DIABETES EYE EXAM

## 2023-03-23 ENCOUNTER — Encounter (INDEPENDENT_AMBULATORY_CARE_PROVIDER_SITE_OTHER): Payer: Self-pay

## 2023-03-23 ENCOUNTER — Other Ambulatory Visit: Payer: Self-pay

## 2023-03-24 ENCOUNTER — Other Ambulatory Visit: Payer: Self-pay

## 2023-04-20 ENCOUNTER — Other Ambulatory Visit: Payer: Self-pay | Admitting: Internal Medicine

## 2023-04-20 ENCOUNTER — Other Ambulatory Visit: Payer: Self-pay

## 2023-04-28 ENCOUNTER — Ambulatory Visit: Payer: PPO | Admitting: Internal Medicine

## 2023-05-02 ENCOUNTER — Ambulatory Visit: Payer: PPO | Admitting: Internal Medicine

## 2023-05-03 ENCOUNTER — Encounter: Payer: Self-pay | Admitting: Internal Medicine

## 2023-05-03 ENCOUNTER — Ambulatory Visit (INDEPENDENT_AMBULATORY_CARE_PROVIDER_SITE_OTHER): Payer: PPO | Admitting: Internal Medicine

## 2023-05-03 VITALS — BP 110/78 | HR 79 | Temp 98.8°F | Ht 64.0 in | Wt 202.8 lb

## 2023-05-03 DIAGNOSIS — E1165 Type 2 diabetes mellitus with hyperglycemia: Secondary | ICD-10-CM

## 2023-05-03 DIAGNOSIS — Z7985 Long-term (current) use of injectable non-insulin antidiabetic drugs: Secondary | ICD-10-CM | POA: Diagnosis not present

## 2023-05-03 DIAGNOSIS — J309 Allergic rhinitis, unspecified: Secondary | ICD-10-CM | POA: Diagnosis not present

## 2023-05-03 DIAGNOSIS — R9431 Abnormal electrocardiogram [ECG] [EKG]: Secondary | ICD-10-CM

## 2023-05-03 DIAGNOSIS — E559 Vitamin D deficiency, unspecified: Secondary | ICD-10-CM

## 2023-05-03 DIAGNOSIS — I10 Essential (primary) hypertension: Secondary | ICD-10-CM

## 2023-05-03 DIAGNOSIS — E538 Deficiency of other specified B group vitamins: Secondary | ICD-10-CM

## 2023-05-03 DIAGNOSIS — E7800 Pure hypercholesterolemia, unspecified: Secondary | ICD-10-CM

## 2023-05-03 DIAGNOSIS — R051 Acute cough: Secondary | ICD-10-CM

## 2023-05-03 DIAGNOSIS — E039 Hypothyroidism, unspecified: Secondary | ICD-10-CM

## 2023-05-03 LAB — HEPATIC FUNCTION PANEL
ALT: 17 U/L (ref 0–35)
AST: 21 U/L (ref 0–37)
Albumin: 3.9 g/dL (ref 3.5–5.2)
Alkaline Phosphatase: 47 U/L (ref 39–117)
Bilirubin, Direct: 0 mg/dL (ref 0.0–0.3)
Total Bilirubin: 0.3 mg/dL (ref 0.2–1.2)
Total Protein: 7.1 g/dL (ref 6.0–8.3)

## 2023-05-03 LAB — CBC WITH DIFFERENTIAL/PLATELET
Basophils Absolute: 0 10*3/uL (ref 0.0–0.1)
Basophils Relative: 0.8 % (ref 0.0–3.0)
Eosinophils Absolute: 0.1 10*3/uL (ref 0.0–0.7)
Eosinophils Relative: 2.4 % (ref 0.0–5.0)
HCT: 32.8 % — ABNORMAL LOW (ref 36.0–46.0)
Hemoglobin: 10.1 g/dL — ABNORMAL LOW (ref 12.0–15.0)
Lymphocytes Relative: 50.8 % — ABNORMAL HIGH (ref 12.0–46.0)
Lymphs Abs: 2.5 10*3/uL (ref 0.7–4.0)
MCHC: 30.7 g/dL (ref 30.0–36.0)
MCV: 76.1 fl — ABNORMAL LOW (ref 78.0–100.0)
Monocytes Absolute: 0.5 10*3/uL (ref 0.1–1.0)
Monocytes Relative: 9.8 % (ref 3.0–12.0)
Neutro Abs: 1.8 10*3/uL (ref 1.4–7.7)
Neutrophils Relative %: 36.2 % — ABNORMAL LOW (ref 43.0–77.0)
Platelets: 252 10*3/uL (ref 150.0–400.0)
RBC: 4.31 Mil/uL (ref 3.87–5.11)
RDW: 14.2 % (ref 11.5–15.5)
WBC: 5 10*3/uL (ref 4.0–10.5)

## 2023-05-03 LAB — LIPID PANEL
Cholesterol: 159 mg/dL (ref 0–200)
HDL: 75.1 mg/dL (ref >39.00)
LDL Cholesterol: 56 mg/dL (ref 0–99)
NonHDL: 83.84
Total CHOL/HDL Ratio: 2
Triglycerides: 138 mg/dL (ref 0.0–149.0)
VLDL: 27.6 mg/dL (ref 0.0–40.0)

## 2023-05-03 LAB — HEMOGLOBIN A1C: Hgb A1c MFr Bld: 5.7 % (ref 4.6–6.5)

## 2023-05-03 LAB — BASIC METABOLIC PANEL
BUN: 18 mg/dL (ref 6–23)
CO2: 28 mEq/L (ref 19–32)
Calcium: 9.2 mg/dL (ref 8.4–10.5)
Chloride: 106 mEq/L (ref 96–112)
Creatinine, Ser: 1.1 mg/dL (ref 0.40–1.20)
GFR: 52.47 mL/min — ABNORMAL LOW (ref >60.00)
Glucose, Bld: 93 mg/dL (ref 70–99)
Potassium: 4.3 mEq/L (ref 3.5–5.1)
Sodium: 140 mEq/L (ref 135–145)

## 2023-05-03 LAB — MICROALBUMIN / CREATININE URINE RATIO
Creatinine,U: 84.6 mg/dL
Microalb Creat Ratio: 1 mg/g (ref 0.0–30.0)
Microalb, Ur: 0.8 mg/dL (ref 0.0–1.9)

## 2023-05-03 LAB — VITAMIN B12: Vitamin B-12: 434 pg/mL (ref 211–911)

## 2023-05-03 LAB — VITAMIN D 25 HYDROXY (VIT D DEFICIENCY, FRACTURES): VITD: 50.1 ng/mL (ref 30.00–100.00)

## 2023-05-03 LAB — TSH: TSH: 2.77 u[IU]/mL (ref 0.35–5.50)

## 2023-05-03 MED ORDER — TIRZEPATIDE 2.5 MG/0.5ML ~~LOC~~ SOAJ: 2.5000 mg | SUBCUTANEOUS | 11 refills | Status: DC

## 2023-05-03 MED ORDER — ALBUTEROL SULFATE HFA 108 (90 BASE) MCG/ACT IN AERS: 2.0000 | INHALATION_SPRAY | Freq: Four times a day (QID) | RESPIRATORY_TRACT | 5 refills | Status: DC | PRN

## 2023-05-03 NOTE — Patient Instructions (Signed)
Please take all new medication as prescribed  - the mounjaro 2.5 mg weekly  Please let us know by mychart in 1 month if you would want the increased dose to 5 mg  Please continue all other medications as before, and refills have been done if requested.  Please have the pharmacy call with any other refills you may need.  Please keep your appointments with your specialists as you may have planned  You will be contacted regarding the referral for: Cardiac CT score testing  Please go to the LAB at the blood drawing area for the tests to be done  You will be contacted by phone if any changes need to be made immediately.  Otherwise, you will receive a letter about your results with an explanation, but please check with MyChart first.  Please make an Appointment to return in 6 months, or sooner if needed, also with Lab Appointment for testing done 3-5 days before at the FIRST FLOOR Lab (so this is for TWO appointments - please see the scheduling desk as you leave)

## 2023-05-04 LAB — URINALYSIS, ROUTINE W REFLEX MICROSCOPIC
Bilirubin Urine: NEGATIVE
Ketones, ur: NEGATIVE
Nitrite: NEGATIVE
Specific Gravity, Urine: 1.02 (ref 1.000–1.030)
Total Protein, Urine: NEGATIVE
Urine Glucose: NEGATIVE
Urobilinogen, UA: 0.2 (ref 0.0–1.0)
pH: 5.5 (ref 5.0–8.0)

## 2023-05-04 MED ORDER — ALPRAZOLAM 0.25 MG PO TABS: 0.2500 mg | ORAL_TABLET | Freq: Two times a day (BID) | ORAL | 1 refills | Status: AC | PRN

## 2023-05-04 NOTE — Telephone Encounter (Signed)
Done erx 

## 2023-05-06 ENCOUNTER — Encounter: Payer: Self-pay | Admitting: Internal Medicine

## 2023-05-06 NOTE — Assessment & Plan Note (Signed)
BP Readings from Last 3 Encounters:  05/03/23 110/78  01/12/23 (!) 129/56  11/30/22 130/76   Stable, pt to continue medical treatment norvasc 10 every day, hct 25 every day, toprol xl 50 every day, micardis 80 qd

## 2023-05-06 NOTE — Assessment & Plan Note (Signed)
Last vitamin D Lab Results  Component Value Date   VD25OH 50.10 05/03/2023   Stable, cont oral replacement

## 2023-05-06 NOTE — Assessment & Plan Note (Signed)
Mild to mod, for otc allegra and/or nasacort asd, to f/u any worsening symptoms or concerns

## 2023-05-06 NOTE — Assessment & Plan Note (Signed)
Lab Results  Component Value Date   VITAMINB12 434 05/03/2023   Stable, cont oral replacement - b12 1000 mcg qd

## 2023-05-06 NOTE — Assessment & Plan Note (Addendum)
Lab Results  Component Value Date   HGBA1C 5.7 05/03/2023   In the setting of uncontrolled obesity, pt to continue current medical treatment  - diet, wt control, also for Card CT score testing, and start mounjaro 2.5 mg weekly

## 2023-05-06 NOTE — Assessment & Plan Note (Addendum)
Lab Results  Component Value Date   LDLCALC 56 05/03/2023   Stable, pt to continue low chol DM diet, delcines statin

## 2023-05-06 NOTE — Assessment & Plan Note (Signed)
Lab Results  Component Value Date   TSH 2.77 05/03/2023   Stable, pt to continue levothyroxine 50 mcg qd

## 2023-05-17 ENCOUNTER — Other Ambulatory Visit (HOSPITAL_COMMUNITY): Payer: Self-pay

## 2023-05-17 ENCOUNTER — Telehealth: Payer: Self-pay

## 2023-05-17 NOTE — Telephone Encounter (Signed)
Pharmacy Patient Advocate Encounter   Received notification from CoverMyMeds that prior authorization for Mounjaro 2.5MG /0.5ML auto-injectors is required/requested.   Insurance verification completed.   The patient is insured through [facility] ADVANTAGE/RX ADVANCE .   Per test claim: PA required; PA submitted to [facility] ADVANTAGE/RX ADVANCE via CoverMyMeds Key/confirmation #/[id number] Status is pending   ALSO FAXED TO [employer]TA DUE TO KNOWN CMM ISSUE

## 2023-05-19 NOTE — Telephone Encounter (Signed)
Pharmacy Patient Advocate Encounter  Received notification from [facility] ADVANTAGE/RX ADVANCE that Prior Authorization for Mounjaro 2.5 mg/0.5 mL subcutaneous pen injector has been APPROVED from 05-18-2023 to 05-17-2024   PA #/Case ID/Reference #: [id number]

## 2023-05-31 ENCOUNTER — Ambulatory Visit (HOSPITAL_BASED_OUTPATIENT_CLINIC_OR_DEPARTMENT_OTHER): Admission: RE | Admit: 2023-05-31 | Payer: PPO | Source: Ambulatory Visit

## 2023-05-31 ENCOUNTER — Ambulatory Visit (HOSPITAL_BASED_OUTPATIENT_CLINIC_OR_DEPARTMENT_OTHER): Payer: PPO

## 2023-06-12 MED ORDER — TIRZEPATIDE 5 MG/0.5ML ~~LOC~~ SOAJ: 5.0000 mg | SUBCUTANEOUS | 3 refills | Status: DC

## 2023-06-12 NOTE — Addendum Note (Signed)
Addended by: Corwin Levins on: 06/12/2023 08:25 PM   Modules accepted: Orders

## 2023-06-19 ENCOUNTER — Encounter: Payer: Self-pay | Admitting: Internal Medicine

## 2023-07-22 ENCOUNTER — Ambulatory Visit (INDEPENDENT_AMBULATORY_CARE_PROVIDER_SITE_OTHER): Payer: PPO | Admitting: Podiatry

## 2023-07-22 ENCOUNTER — Encounter: Payer: Self-pay | Admitting: Podiatry

## 2023-07-22 DIAGNOSIS — B49 Unspecified mycosis: Secondary | ICD-10-CM

## 2023-07-22 DIAGNOSIS — L600 Ingrowing nail: Secondary | ICD-10-CM | POA: Diagnosis not present

## 2023-07-22 DIAGNOSIS — R52 Pain, unspecified: Secondary | ICD-10-CM

## 2023-07-22 NOTE — Patient Instructions (Signed)

## 2023-07-22 NOTE — Progress Notes (Unsigned)
Subjective: Chief Complaint  Patient presents with   Ingrown Toenail    Rm#14 Right big toe ingrown nail patient experiencing a lot of discomfort.   67 year old female presents the office today with the above concerns, which is new.  She states that the right big toe is getting ingrown causing discomfort.  It hurts with pressure.  No drainage or pus.  She has tried to trim the toenails.  Objective: AAO x3, NAD DP/PT pulses palpable bilaterally, CRT less than 3 seconds Incurvation present to right medial hallux nail border with tenderness palpation pain is localized edema there is no drainage or pus.  She has some tenderness on the left medial nail border but not nearly as symptomatic as the right side.  There are no open lesions identified. Nails hypertrophic, dystrophic. No pain with calf compression, swelling, warmth, erythema  Assessment: Ingrown toenail right medial hallux  Plan: -All treatment options discussed with the patient including all alternatives, risks, complications.  -At this time, the patient is requesting partial nail removal with chemical matricectomy to the symptomatic portion of the nail. Risks and complications were discussed with the patient for which they understand and written consent was obtained. Under sterile conditions a total of 3 mL of a mixture of 2% lidocaine plain and 0.5% Marcaine plain was infiltrated in a hallux block fashion. Once anesthetized, the skin was prepped in sterile fashion. A tourniquet was then applied. Next the medial aspect of hallux nail border was then sharply excised making sure to remove the entire offending nail border. Once the nails were ensured to be removed area was debrided and the underlying skin was intact. There is no purulence identified in the procedure. Next phenol was then applied under standard conditions and copiously irrigated.  Silvadene was applied. A dry sterile dressing was applied. After application of the dressing the  tourniquet was removed and there is found to be an immediate capillary refill time to the digit. The patient tolerated the procedure well any complications. Post procedure instructions were discussed the patient for which he verbally understood. Discussed signs/symptoms of infection and directed to call the office immediately should any occur or go directly to the emergency room. In the meantime, encouraged to call the office with any questions, concerns, changes symptoms. -Nail sent for culture.  -If symptoms persist on the left side we will proceed with partial nail avulsion -Patient encouraged to call the office with any questions, concerns, change in symptoms.   Return in about 2 weeks (around 08/05/2023), or if symptoms worsen or fail to improve, for nail check.  Vivi Barrack DPM

## 2023-07-24 ENCOUNTER — Encounter: Payer: Self-pay | Admitting: Podiatry

## 2023-08-04 ENCOUNTER — Other Ambulatory Visit: Payer: Self-pay | Admitting: Podiatry

## 2023-08-08 ENCOUNTER — Encounter: Payer: Self-pay | Admitting: Internal Medicine

## 2023-08-08 MED ORDER — TIRZEPATIDE 7.5 MG/0.5ML ~~LOC~~ SOAJ: 7.5000 mg | SUBCUTANEOUS | 3 refills | Status: DC

## 2023-08-16 ENCOUNTER — Other Ambulatory Visit: Payer: Self-pay

## 2023-08-16 ENCOUNTER — Other Ambulatory Visit: Payer: Self-pay | Admitting: Internal Medicine

## 2023-08-17 ENCOUNTER — Ambulatory Visit: Payer: PPO | Admitting: Podiatry

## 2023-09-01 ENCOUNTER — Other Ambulatory Visit: Payer: Self-pay

## 2023-09-01 ENCOUNTER — Other Ambulatory Visit: Payer: Self-pay | Admitting: Internal Medicine

## 2023-09-20 ENCOUNTER — Encounter: Payer: Self-pay | Admitting: Physical Therapy

## 2023-10-02 ENCOUNTER — Encounter: Payer: Self-pay | Admitting: Internal Medicine

## 2023-10-02 ENCOUNTER — Other Ambulatory Visit: Payer: Self-pay | Admitting: Internal Medicine

## 2023-10-02 DIAGNOSIS — R339 Retention of urine, unspecified: Secondary | ICD-10-CM

## 2023-10-04 ENCOUNTER — Other Ambulatory Visit (INDEPENDENT_AMBULATORY_CARE_PROVIDER_SITE_OTHER): Payer: PPO

## 2023-10-04 ENCOUNTER — Encounter: Payer: Self-pay | Admitting: Internal Medicine

## 2023-10-04 DIAGNOSIS — E538 Deficiency of other specified B group vitamins: Secondary | ICD-10-CM | POA: Diagnosis not present

## 2023-10-04 DIAGNOSIS — E559 Vitamin D deficiency, unspecified: Secondary | ICD-10-CM | POA: Diagnosis not present

## 2023-10-04 DIAGNOSIS — R339 Retention of urine, unspecified: Secondary | ICD-10-CM

## 2023-10-04 DIAGNOSIS — E1165 Type 2 diabetes mellitus with hyperglycemia: Secondary | ICD-10-CM

## 2023-10-04 LAB — URINALYSIS, ROUTINE W REFLEX MICROSCOPIC
Bilirubin Urine: NEGATIVE
Hgb urine dipstick: NEGATIVE
Ketones, ur: NEGATIVE
Leukocytes,Ua: NEGATIVE
Nitrite: NEGATIVE
RBC / HPF: NONE SEEN
Specific Gravity, Urine: 1.02 (ref 1.000–1.030)
Total Protein, Urine: NEGATIVE
Urine Glucose: NEGATIVE
Urobilinogen, UA: 0.2 (ref 0.0–1.0)
pH: 5.5 (ref 5.0–8.0)

## 2023-10-04 LAB — CBC WITH DIFFERENTIAL/PLATELET
Basophils Absolute: 0 10*3/uL (ref 0.0–0.1)
Basophils Relative: 0.7 % (ref 0.0–3.0)
Eosinophils Absolute: 0.1 10*3/uL (ref 0.0–0.7)
Eosinophils Relative: 2.2 % (ref 0.0–5.0)
HCT: 34 % — ABNORMAL LOW (ref 36.0–46.0)
Hemoglobin: 10.5 g/dL — ABNORMAL LOW (ref 12.0–15.0)
Lymphocytes Relative: 43.5 % (ref 12.0–46.0)
Lymphs Abs: 2.4 10*3/uL (ref 0.7–4.0)
MCHC: 30.8 g/dL (ref 30.0–36.0)
MCV: 74.7 fl — ABNORMAL LOW (ref 78.0–100.0)
Monocytes Absolute: 0.5 10*3/uL (ref 0.1–1.0)
Monocytes Relative: 8.9 % (ref 3.0–12.0)
Neutro Abs: 2.4 10*3/uL (ref 1.4–7.7)
Neutrophils Relative %: 44.7 % (ref 43.0–77.0)
Platelets: 257 10*3/uL (ref 150.0–400.0)
RBC: 4.55 Mil/uL (ref 3.87–5.11)
RDW: 14.4 % (ref 11.5–15.5)
WBC: 5.5 10*3/uL (ref 4.0–10.5)

## 2023-10-04 LAB — LIPID PANEL
Cholesterol: 156 mg/dL (ref 0–200)
HDL: 57.2 mg/dL (ref >39.00)
LDL Cholesterol: 78 mg/dL (ref 0–99)
NonHDL: 98.41
Total CHOL/HDL Ratio: 3
Triglycerides: 103 mg/dL (ref 0.0–149.0)
VLDL: 20.6 mg/dL (ref 0.0–40.0)

## 2023-10-04 LAB — MICROALBUMIN / CREATININE URINE RATIO
Creatinine,U: 114.9 mg/dL
Microalb Creat Ratio: 6.1 mg/g (ref 0.0–30.0)
Microalb, Ur: <0.7 mg/dL (ref 0.0–1.9)

## 2023-10-04 LAB — VITAMIN B12: Vitamin B-12: 267 pg/mL (ref 211–911)

## 2023-10-04 LAB — BASIC METABOLIC PANEL WITH GFR
BUN: 24 mg/dL — ABNORMAL HIGH (ref 6–23)
CO2: 25 meq/L (ref 19–32)
Calcium: 9.2 mg/dL (ref 8.4–10.5)
Chloride: 101 meq/L (ref 96–112)
Creatinine, Ser: 1.09 mg/dL (ref 0.40–1.20)
GFR: 52.89 mL/min — ABNORMAL LOW
Glucose, Bld: 80 mg/dL (ref 70–99)
Potassium: 3.7 meq/L (ref 3.5–5.1)
Sodium: 134 meq/L — ABNORMAL LOW (ref 135–145)

## 2023-10-04 LAB — VITAMIN D 25 HYDROXY (VIT D DEFICIENCY, FRACTURES): VITD: 55.18 ng/mL (ref 30.00–100.00)

## 2023-10-04 LAB — HEPATIC FUNCTION PANEL
ALT: 15 U/L (ref 0–35)
AST: 20 U/L (ref 0–37)
Albumin: 4.1 g/dL (ref 3.5–5.2)
Alkaline Phosphatase: 39 U/L (ref 39–117)
Bilirubin, Direct: 0.1 mg/dL (ref 0.0–0.3)
Total Bilirubin: 0.5 mg/dL (ref 0.2–1.2)
Total Protein: 7.5 g/dL (ref 6.0–8.3)

## 2023-10-04 LAB — TSH: TSH: 1.31 u[IU]/mL (ref 0.35–5.50)

## 2023-10-04 LAB — HEMOGLOBIN A1C: Hgb A1c MFr Bld: 5.4 % (ref 4.6–6.5)

## 2023-10-04 NOTE — Progress Notes (Signed)
 The test results show that your current treatment is OK, as the tests are stable.  Please continue the same plan.  There is no other need for change of treatment or further evaluation based on these results, at this time.  thanks

## 2023-10-05 LAB — URINE CULTURE

## 2023-10-05 MED ORDER — TAMSULOSIN HCL 0.4 MG PO CAPS: 0.4000 mg | ORAL_CAPSULE | Freq: Every day | ORAL | 3 refills | Status: AC

## 2023-10-06 ENCOUNTER — Ambulatory Visit: Payer: PPO

## 2023-10-06 VITALS — Ht 64.0 in | Wt 187.0 lb

## 2023-10-06 DIAGNOSIS — Z0000 Encounter for general adult medical examination without abnormal findings: Secondary | ICD-10-CM

## 2023-10-06 DIAGNOSIS — Z780 Asymptomatic menopausal state: Secondary | ICD-10-CM

## 2023-10-06 NOTE — Patient Instructions (Signed)
 Elizabeth Hawkins , Thank you for taking time to come for your Medicare Wellness Visit. I appreciate your ongoing commitment to your health goals. Please review the following plan we discussed and let me know if I can assist you in the future.   Referrals/Orders/Follow-Ups/Clinician Recommendations: It was a pleasure talking with you today.  Keep up the good work.  You are due for the new Pneumonia vaccine.  You have an order for:  [x]   Bone Density     Please call for appointment:  [facility] [address] [place], [place] [address] [phone]  Make sure to wear two-piece clothing.  No lotions, powders, or deodorants the day of the appointment. Make sure to bring picture ID and insurance card.  Bring list of medications you are currently taking including any supplements.   Schedule your [place] screening mammogram through MyChart!   Log into your MyChart account.  Go to 'Visit' (or 'Appointments' if on mobile App) --> Schedule an Appointment  Under 'Select a Reason for Visit' choose the Mammogram Screening option.  Complete the pre-visit questions and select the time and place that best fits your schedule.    This is a list of the screening recommended for you and due dates:  Health Maintenance  Topic Date Due   COVID-19 Vaccine (13 - 2024-25 season) 08/02/2023   Pneumonia Vaccine (3 of 3 - PPSV23 or PCV20) 10/23/2023   Flu Shot  11/06/2023*   Complete foot exam   10/26/2023   Eye exam for diabetics  03/21/2024   Hemoglobin A1C  04/02/2024   Yearly kidney function blood test for diabetes  10/03/2024   Yearly kidney health urinalysis for diabetes  10/03/2024   Medicare Annual Wellness Visit  10/05/2024   Colon Cancer Screening  03/20/2025   DTaP/Tdap/Td vaccine (3 - Td or Tdap) 08/09/2026   DEXA scan (bone density measurement)  Completed   Hepatitis C Screening  Completed   Zoster (Shingles) Vaccine  Completed   HPV Vaccine  Aged Out   Mammogram   Discontinued  *Topic was postponed. The date shown is not the original due date.    Advanced directives: (Copy Requested) Please bring a copy of your health care power of attorney and living will to the office to be added to your chart at your convenience.  Next Medicare Annual Wellness Visit scheduled for next year: Yes
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
## 2023-10-09 DIAGNOSIS — Z6833 Body mass index (BMI) 33.0-33.9, adult: Secondary | ICD-10-CM | POA: Diagnosis not present

## 2023-10-09 DIAGNOSIS — Z01419 Encounter for gynecological examination (general) (routine) without abnormal findings: Secondary | ICD-10-CM | POA: Diagnosis not present

## 2023-10-13 ENCOUNTER — Encounter: Payer: Self-pay | Admitting: Podiatry

## 2023-10-13 ENCOUNTER — Ambulatory Visit: Admitting: Podiatry

## 2023-10-13 DIAGNOSIS — L600 Ingrowing nail: Secondary | ICD-10-CM | POA: Diagnosis not present

## 2023-10-13 NOTE — Patient Instructions (Signed)

## 2023-10-13 NOTE — Progress Notes (Signed)
 Subjective:   Patient ID: Elizabeth Hawkins, female   DOB: 68 y.o.   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

## 2023-10-31 ENCOUNTER — Ambulatory Visit (INDEPENDENT_AMBULATORY_CARE_PROVIDER_SITE_OTHER): Admitting: Internal Medicine

## 2023-10-31 ENCOUNTER — Ambulatory Visit: Payer: PPO | Admitting: Internal Medicine

## 2023-10-31 ENCOUNTER — Encounter: Payer: Self-pay | Admitting: Internal Medicine

## 2023-10-31 VITALS — BP 124/76 | HR 75 | Temp 98.8°F | Ht 64.0 in | Wt 193.0 lb

## 2023-10-31 DIAGNOSIS — E538 Deficiency of other specified B group vitamins: Secondary | ICD-10-CM | POA: Diagnosis not present

## 2023-10-31 DIAGNOSIS — R9431 Abnormal electrocardiogram [ECG] [EKG]: Secondary | ICD-10-CM

## 2023-10-31 DIAGNOSIS — E7800 Pure hypercholesterolemia, unspecified: Secondary | ICD-10-CM

## 2023-10-31 DIAGNOSIS — E559 Vitamin D deficiency, unspecified: Secondary | ICD-10-CM

## 2023-10-31 DIAGNOSIS — Z0001 Encounter for general adult medical examination with abnormal findings: Secondary | ICD-10-CM | POA: Diagnosis not present

## 2023-10-31 DIAGNOSIS — R252 Cramp and spasm: Secondary | ICD-10-CM | POA: Diagnosis not present

## 2023-10-31 DIAGNOSIS — E1165 Type 2 diabetes mellitus with hyperglycemia: Secondary | ICD-10-CM

## 2023-10-31 DIAGNOSIS — Z853 Personal history of malignant neoplasm of breast: Secondary | ICD-10-CM | POA: Insufficient documentation

## 2023-10-31 DIAGNOSIS — Z7985 Long-term (current) use of injectable non-insulin antidiabetic drugs: Secondary | ICD-10-CM

## 2023-10-31 DIAGNOSIS — I10 Essential (primary) hypertension: Secondary | ICD-10-CM

## 2023-10-31 MED ORDER — CYCLOBENZAPRINE HCL 10 MG PO TABS
10.0000 mg | ORAL_TABLET | Freq: Three times a day (TID) | ORAL | 2 refills | Status: AC | PRN
Start: 2023-10-31 — End: ?

## 2023-10-31 MED ORDER — TIRZEPATIDE 10 MG/0.5ML ~~LOC~~ SOAJ: 10.0000 mg | SUBCUTANEOUS | 11 refills | Status: DC

## 2023-10-31 NOTE — Patient Instructions (Signed)
 Ok to increase the mounjaro to 10 mg  Ok to increase the flexeril to 10 mg as needed  Please continue all other medications as before, and refills have been done if requested.  Please have the pharmacy call with any other refills you may need.  Please continue your efforts at being more active, low cholesterol diet, and weight control.  You are otherwise up to date with prevention measures today.  Please keep your appointments with your specialists as you may have planned  You will be contacted regarding the referral for: Cardiac CT Score  Please make an Appointment to return in 6 months, or sooner if needed, also with Lab Appointment for testing done 3-5 days before at the FIRST FLOOR Lab (so this is for TWO appointments - please see the scheduling desk as you leave)

## 2023-10-31 NOTE — Assessment & Plan Note (Signed)
 BP Readings from Last 3 Encounters:  10/31/23 124/76  05/03/23 110/78  01/12/23 (!) 129/56   Stable, pt to continue medical treatment norvasc 10 every day, hct 25 every day, toprol xl 50 every day, micardis 80 qd

## 2023-10-31 NOTE — Assessment & Plan Note (Signed)
 Lab Results  Component Value Date   HGBA1C 5.4 10/04/2023   With uncontrolled wt - for mounjaro 10 mg weekly

## 2023-10-31 NOTE — Assessment & Plan Note (Signed)

## 2023-10-31 NOTE — Assessment & Plan Note (Signed)
 Last vitamin D Lab Results  Component Value Date   VD25OH 55.18 10/04/2023   Stable, cont oral replacement

## 2023-10-31 NOTE — Assessment & Plan Note (Signed)
 Lab Results  Component Value Date   VITAMINB12 267 10/04/2023   Low, to start oral replacement - b12 1000 mcg qd

## 2023-10-31 NOTE — Assessment & Plan Note (Signed)
 Ok for increased flexeril 10 tid prn

## 2023-10-31 NOTE — Assessment & Plan Note (Addendum)
 Lab Results  Component Value Date   LDLCALC 78 10/04/2023   uncontrolled, pt for lower chol diet, declines statin, also for cardiac CT score
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
## 2023-11-06 ENCOUNTER — Telehealth: Payer: Self-pay

## 2023-11-06 ENCOUNTER — Other Ambulatory Visit (HOSPITAL_COMMUNITY): Payer: Self-pay

## 2023-11-06 NOTE — Telephone Encounter (Signed)
 Pharmacy Patient Advocate Encounter   Received notification from CoverMyMeds that prior authorization for Cyclobenzaprine HCl 10MG  tablets is required/requested.   Insurance verification completed.   The patient is insured through [facility] ADVANTAGE/RX ADVANCE .   Per test claim: PA required; PA submitted to above mentioned insurance via CoverMyMeds Key/confirmation #/EOC [id number] Status is pending

## 2023-11-08 ENCOUNTER — Other Ambulatory Visit (HOSPITAL_COMMUNITY): Payer: Self-pay

## 2023-11-08 NOTE — Telephone Encounter (Signed)
 Pharmacy Patient Advocate Encounter  Received notification from [facility] ADVANTAGE/RX ADVANCE that Prior Authorization for Cyclobenzaprine HCl 10MG  tablets  has been APPROVED from 11/07/2023 to 12/05/2023. Ran test claim, Copay is $0.97. called pharmacy to get them to rerun script and received a paid claim. They will contact pt to let them know when ready!  PA #/Case ID/Reference #:  [id number]

## 2023-12-20 ENCOUNTER — Other Ambulatory Visit (HOSPITAL_BASED_OUTPATIENT_CLINIC_OR_DEPARTMENT_OTHER)

## 2024-01-08 DIAGNOSIS — R3911 Hesitancy of micturition: Secondary | ICD-10-CM | POA: Diagnosis not present

## 2024-01-23 ENCOUNTER — Encounter: Payer: Self-pay | Admitting: Internal Medicine

## 2024-01-24 ENCOUNTER — Encounter: Payer: Self-pay | Admitting: Internal Medicine

## 2024-01-24 MED ORDER — TIRZEPATIDE 12.5 MG/0.5ML ~~LOC~~ SOAJ: 12.5000 mg | SUBCUTANEOUS | 3 refills | Status: AC

## 2024-02-02 ENCOUNTER — Telehealth: Admitting: Physician Assistant

## 2024-02-02 ENCOUNTER — Ambulatory Visit: Payer: Self-pay | Admitting: *Deleted

## 2024-02-02 ENCOUNTER — Ambulatory Visit: Payer: Self-pay

## 2024-02-02 DIAGNOSIS — I952 Hypotension due to drugs: Secondary | ICD-10-CM | POA: Diagnosis not present

## 2024-02-02 NOTE — Telephone Encounter (Signed)
 Copied from CRM [id number]. Topic: Clinical - Red Word Triage >> Feb 02, 2024  2:24 PM [place] A wrote: [facility] that prompted transfer to Nurse Triage: Patient has low blood pressure and headaches Reason for Disposition  [1] Fall in systolic BP > 20 mm Hg from normal AND [2] NOT dizzy, lightheaded, or weak    See pt's MyChart message with her BP readings recorded there.   She sent them this morning.  Should sh continue taking her BP medications?   She did not take any this morning.  Answer Assessment - Initial Assessment Questions 1. BLOOD PRESSURE: What is the blood pressure? Did you take at least two measurements 5 minutes apart?     I'm running a low BP for the last week or so.  He told me to keep a record.   I sent a MyChart message but I have not heard anything.   85/43 today.   88/54 when I checked it again.   2. ONSET: When did you take your blood pressure?     10:30 Am and 11:30 AM today 3. HOW: How did you obtain the blood pressure? (e.g., visiting nurse, automatic home BP monitor)     I went to [facility] and had it done. 4. HISTORY: Do you have a history of low blood pressure? What is your blood pressure normally?     No I have a history of it being high.   I've lost of weight so that could affecting it.   I'm on Mounjaro  too.    5. MEDICINES: Are you taking any medications for blood pressure? If Yes, ask: Have they been changed recently?     I'm on 3 BP medications 6. PULSE RATE: Do you know what your pulse rate is?      75 I did not take any BP medication this morning.    7. OTHER SYMPTOMS: Have you been sick recently? Have you had a recent injury?     I have a nagging headache for the last couple of days.  Tylenol  not touching it.   No dizziness. 8. PREGNANCY: Is there any chance you are pregnant? When was your last menstrual period?     N/A due to age  Protocols used: Blood Pressure - Low-A-AH  See pt's MyChart message with BP readings she sent over this  morning for Dr. Norleen.  FYI Only or Action Required?: Action required by provider: clinical question for provider.  Patient was last seen in primary care on 10/31/2023 by Norleen Lynwood ORN, MD. Called Nurse Triage reporting Hypertension. Symptoms began several days ago. Interventions attempted: Prescription medications: Did not take BP medications today. Symptoms are: stable  Only symptom for last 2 days is a nagging headache.   Denies dizziness or lightheadedness.  Triage Disposition: Call PCP Now   Patient/caregiver understands and will follow disposition?: Yes Pt seeking advice on dosing her BP medication due to low BP readings.   Please call her.    Message sent to Dr. Norleen.

## 2024-02-02 NOTE — Telephone Encounter (Signed)
 Already addressed twice today   see other notes thanks

## 2024-02-02 NOTE — Progress Notes (Signed)
 Virtual Visit Consent   Elizabeth Hawkins, you are scheduled for a virtual visit with a [facility] provider today. Just as with appointments in the office, your consent must be obtained to participate. Your consent will be active for this visit and any virtual visit you may have with one of our providers in the next 365 days. If you have a MyChart account, a copy of this consent can be sent to you electronically.  As this is a virtual visit, video technology does not allow for your provider to perform a traditional examination. This may limit your provider's ability to fully assess your condition. If your provider identifies any concerns that need to be evaluated in person or the need to arrange testing (such as labs, EKG, etc.), we will make arrangements to do so. Although advances in technology are sophisticated, we cannot ensure that it will always work on either your end or our end. If the connection with a video visit is poor, the visit may have to be switched to a telephone visit. With either a video or telephone visit, we are not always able to ensure that we have a secure connection.  By engaging in this virtual visit, you consent to the provision of healthcare and authorize for your insurance to be billed (if applicable) for the services provided during this visit. Depending on your insurance coverage, you may receive a charge related to this service.  I need to obtain your verbal consent now. Are you willing to proceed with your visit today? JAVERIA BRISKI has provided verbal consent on 02/02/2024 for a virtual visit (video or telephone). Delon CHRISTELLA Dickinson, PA-C  Date: 02/02/2024 6:15 PM   Virtual Visit via Video Note   I, Delon CHRISTELLA Dickinson, connected with  NYRA ANSPAUGH  ([id number], [birth year]) on 02/02/24 at  5:45 PM EDT by a video-enabled telemedicine application and verified that I am speaking with the correct person using two identifiers.  Location: Patient: Virtual Visit Location  Patient: Home Provider: Virtual Visit Location Provider: Home Office   I discussed the limitations of evaluation and management by telemedicine and the availability of in person appointments. The patient expressed understanding and agreed to proceed.    History of Present Illness: Elizabeth Hawkins is a 68 y.o. who identifies as a female who was assigned female at birth, and is being seen today for hypotension.  BP: 88/49 HR 72  At home readings since 01/24/24 95/64 93/53  85/54 88/54 86/66  85/55  She is having a dull headache but denies any fatigue, lightheadedness, dizziness, syncope (or prodrome).  She is on Amlodipine  10mg , Metoprolol  XL 50mg , Telmisartan  80mg , and HCTZ 25mg .  Had been told by her PCP to take all meds, but decrease Telmisartan  to 40mg  (half tablet).   However, today she has not taken any medication and her BP was 88/49 about an hour ago.   Problems:  Patient Active Problem List   Diagnosis Date Noted   History of malignant neoplasm of breast 10/31/2023   Pure hypercholesterolemia 10/26/2022   Edema 06/04/2022   Pain and swelling of lower leg 06/04/2022   Gastritis 12/04/2021   Kidney stone 12/01/2021   Osteoarthritis of left knee 07/20/2021   Osteoarthritis of right knee 07/20/2021   Cough 07/17/2021   Wheezing 07/17/2021   Muscle cramping 01/09/2021   Itching 01/08/2021   B12 deficiency 10/28/2019   Vitamin D  deficiency 10/28/2019   Diarrhea 06/30/2019   Headache 05/22/2019   Ankle fracture 02/25/2019  Contusion of face 02/25/2019   Posterior neck pain 02/25/2019   Laceration of face 02/25/2019   Nausea 02/25/2019   Chronic constipation 07/26/2018   Chest pressure 04/17/2018   Insomnia 04/17/2018   Lower back pain 12/22/2017   Abdominal pain 12/22/2017   Femoral acetabular impingement 10/25/2017   Lumbar spinal stenosis 10/25/2017   Left hip pain 10/07/2017   Greater trochanteric bursitis of left hip 10/04/2017   Ventral hernia 08/09/2017    Hypothyroidism 08/09/2016   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   Rectal bleeding 01/06/2016   Rectal pain 01/06/2016   Anal fissure 01/06/2016   Constipation 01/06/2016   Acute blood loss anemia 12/16/2014   Dysuria 12/16/2014   Morbid obesity (HCC) 12/08/2014   Numbness of toes 06/14/2014   Ingrown nail 06/14/2014   Chest pain 05/05/2014   GERD (gastroesophageal reflux disease) 07/23/2013   Pain of right thumb 10/30/2012   Dry eyes 10/30/2012   Hx of adenomatous colonic polyps 05/01/2012   Heart palpitations 05/01/2012   Encounter for well adult exam with abnormal findings 05/01/2012   Obesity    PULMONARY NODULE, RIGHT LOWER LOBE 04/07/2010   COLITIS 04/07/2010   HYPERSOMNIA, ASSOCIATED WITH SLEEP APNEA 12/05/2008   Essential hypertension 10/27/2008   Fatigue 04/03/2008   Diabetes (HCC) 08/21/2007   Anemia 08/21/2007   Allergic rhinitis 08/21/2007   Asthma 08/21/2007   Hyperlipidemia 04/03/2007   Anxiety state 04/03/2007   DEPRESSION 04/03/2007    Allergies:  Allergies  Allergen Reactions   Aspirin      History gastric bypass   Medications:  Current Outpatient Medications:    acetaminophen  (TYLENOL ) 500 MG tablet, Take 1,000 mg by mouth every 8 (eight) hours as needed for headache. , Disp: , Rfl:    albuterol  (VENTOLIN  HFA) 108 (90 Base) MCG/ACT inhaler, Inhale 2 puffs into the lungs every 6 (six) hours as needed for wheezing or shortness of breath., Disp: 1 each, Rfl: 5   ALPRAZolam  (XANAX ) 0.25 MG tablet, Take 1 tablet (0.25 mg total) by mouth 2 (two) times daily as needed for anxiety., Disp: 60 tablet, Rfl: 1   amLODipine  (NORVASC ) 10 MG tablet, TAKE 1 TABLET BY MOUTH EVERY DAY, Disp: 90 tablet, Rfl: 3   cholecalciferol (VITAMIN D ) 1000 units tablet, Take 1,000 Units by mouth daily., Disp: , Rfl:    Cyanocobalamin  (B-12) 2500 MCG SUBL, DISSOLVE 1 TABLET ON TONGUE DAILY, Disp: 75 tablet, Rfl: 2   cyclobenzaprine  (FLEXERIL ) 10 MG tablet, Take  1 tablet (10 mg total) by mouth 3 (three) times daily as needed for muscle spasms., Disp: 30 tablet, Rfl: 2   fluconazole  (DIFLUCAN ) 150 MG tablet, 1 tab by mouth every 3 days as needed, Disp: 2 tablet, Rfl: 1   hydrochlorothiazide  (HYDRODIURIL ) 25 MG tablet, TAKE 1 TABLET (25 MG TOTAL) BY MOUTH DAILY., Disp: 90 tablet, Rfl: 3   levothyroxine  (SYNTHROID ) 50 MCG tablet, TAKE ONE-HALF TABLET BY MOUTH DAILY BEORE BREAKFAST, Disp: 45 tablet, Rfl: 3   metoprolol  succinate (TOPROL -XL) 50 MG 24 hr tablet, TAKE 1 TABLET BY MOUTH DAILY WITH OR IMMEDIATELY FOLLOWING A MEAL, Disp: 90 tablet, Rfl: 3   polyethylene glycol powder (GLYCOLAX /MIRALAX ) 17 GM/SCOOP powder, Take 17 g by mouth daily., Disp: , Rfl:    potassium chloride  (MICRO-K ) 10 MEQ CR capsule, TAKE 1 CAPSULE BY MOUTH EVERY DAY, Disp: 90 capsule, Rfl: 3   tamsulosin  (FLOMAX ) 0.4 MG CAPS capsule, Take 1 capsule (0.4 mg total) by mouth daily., Disp: 90 capsule, Rfl:  3   telmisartan  (MICARDIS ) 80 MG tablet, TAKE 1 TABLET BY MOUTH EVERY DAY, Disp: 90 tablet, Rfl: 3   tirzepatide  (MOUNJARO ) 12.5 MG/0.5ML Pen, Inject 12.5 mg into the skin once a week., Disp: 6 mL, Rfl: 3  Observations/Objective: Patient is well-developed, well-nourished in no acute distress.  Resting comfortably at home.  Head is normocephalic, atraumatic.  No labored breathing.  Speech is clear and coherent with logical content.  Patient is alert and oriented at baseline.    Assessment and Plan: 1. Hypotension due to drugs (Primary)  - Advised patient to not take any BP medications at this time since BP is still less than 100/60 - She can recheck her BP tomorrow morning. If still less than 100/60 I have advised her to not take ANY of her BP medication again. - If BP is greater than 100/60 but less than 140/90 she can take the amlodipine  10mg , Metoprolol  XL 50mg , and HCTZ 25mg  and HOLD the Telmisartan  for now - If BP is greater than 140/90 she can take Amlodipine  10mg , Metoprolol   XL 50mg , HCTZ 25mg  and Telmisartan  40mg  (half a tablet) - Follow up with PCP on Monday per his request  Follow Up Instructions: I discussed the assessment and treatment plan with the patient. The patient was provided an opportunity to ask questions and all were answered. The patient agreed with the plan and demonstrated an understanding of the instructions.  A copy of instructions were sent to the patient via MyChart unless otherwise noted below.    The patient was advised to call back or seek an in-person evaluation if the symptoms worsen or if the condition fails to improve as anticipated.    Delon CHRISTELLA Dickinson, PA-C

## 2024-02-02 NOTE — Patient Instructions (Signed)
 Elizabeth Hawkins, thank you for joining Elizabeth CHRISTELLA Dickinson, Elizabeth Hawkins for today's virtual visit.  While this provider is not your primary care provider (PCP), if your PCP is located in our provider database this encounter information will be shared with them immediately following your visit.   A [facility] MyChart account gives you access to today's visit and all your visits, tests, and labs performed at [facility]  click here if you don't have a [facility] MyChart account or go to [email or web address]  Consent: (Patient) Elizabeth Hawkins provided verbal consent for this virtual visit at the beginning of the encounter.  Current Medications:  Current Outpatient Medications:    acetaminophen  (TYLENOL ) 500 MG tablet, Take 1,000 mg by mouth every 8 (eight) hours as needed for headache. , Disp: , Rfl:    albuterol  (VENTOLIN  HFA) 108 (90 Base) MCG/ACT inhaler, Inhale 2 puffs into the lungs every 6 (six) hours as needed for wheezing or shortness of breath., Disp: 1 each, Rfl: 5   ALPRAZolam  (XANAX ) 0.25 MG tablet, Take 1 tablet (0.25 mg total) by mouth 2 (two) times daily as needed for anxiety., Disp: 60 tablet, Rfl: 1   amLODipine  (NORVASC ) 10 MG tablet, TAKE 1 TABLET BY MOUTH EVERY DAY, Disp: 90 tablet, Rfl: 3   cholecalciferol (VITAMIN D ) 1000 units tablet, Take 1,000 Units by mouth daily., Disp: , Rfl:    Cyanocobalamin  (B-12) 2500 MCG SUBL, DISSOLVE 1 TABLET ON TONGUE DAILY, Disp: 75 tablet, Rfl: 2   cyclobenzaprine  (FLEXERIL ) 10 MG tablet, Take 1 tablet (10 mg total) by mouth 3 (three) times daily as needed for muscle spasms., Disp: 30 tablet, Rfl: 2   fluconazole  (DIFLUCAN ) 150 MG tablet, 1 tab by mouth every 3 days as needed, Disp: 2 tablet, Rfl: 1   hydrochlorothiazide  (HYDRODIURIL ) 25 MG tablet, TAKE 1 TABLET (25 MG TOTAL) BY MOUTH DAILY., Disp: 90 tablet, Rfl: 3   levothyroxine  (SYNTHROID ) 50 MCG tablet, TAKE ONE-HALF TABLET BY MOUTH DAILY BEORE BREAKFAST, Disp: 45 tablet, Rfl:  3   metoprolol  succinate (TOPROL -XL) 50 MG 24 hr tablet, TAKE 1 TABLET BY MOUTH DAILY WITH OR IMMEDIATELY FOLLOWING A MEAL, Disp: 90 tablet, Rfl: 3   polyethylene glycol powder (GLYCOLAX /MIRALAX ) 17 GM/SCOOP powder, Take 17 g by mouth daily., Disp: , Rfl:    potassium chloride  (MICRO-K ) 10 MEQ CR capsule, TAKE 1 CAPSULE BY MOUTH EVERY DAY, Disp: 90 capsule, Rfl: 3   tamsulosin  (FLOMAX ) 0.4 MG CAPS capsule, Take 1 capsule (0.4 mg total) by mouth daily., Disp: 90 capsule, Rfl: 3   telmisartan  (MICARDIS ) 80 MG tablet, TAKE 1 TABLET BY MOUTH EVERY DAY, Disp: 90 tablet, Rfl: 3   tirzepatide  (MOUNJARO ) 12.5 MG/0.5ML Pen, Inject 12.5 mg into the skin once a week., Disp: 6 mL, Rfl: 3   Medications ordered in this encounter:  No orders of the defined types were placed in this encounter.    *If you need refills on other medications prior to your next appointment, please contact your pharmacy*  Follow-Up: Call back or seek an in-person evaluation if the symptoms worsen or if the condition fails to improve as anticipated.  [facility] [phone]  Other Instructions  - Advised patient to not take any BP medications at this time since BP is still less than 100/60 - She can recheck her BP tomorrow morning. If still less than 100/60 I have advised her to not take ANY of her BP medication again. - If BP is greater than 100/60 but  less than 140/90 she can take the amlodipine  10mg , Metoprolol  XL 50mg , and HCTZ 25mg  and HOLD the Telmisartan  for now - If BP is greater than 140/90 she can take Amlodipine  10mg , Metoprolol  XL 50mg , HCTZ 25mg  and Telmisartan  40mg  (half a tablet) - Follow up with PCP on Monday per his request  Hypotension As the heart beats, it forces blood through the body. Hypotension, commonly called low blood pressure, is when the force of blood pumping through the arteries is too weak. Arteries are blood vessels that carry blood from the heart throughout the body. Depending  on the cause and severity, hypotension may be harmless (benign) or may cause serious problems (be critical). When your blood pressure is too low, you may not get enough blood to your brain or to the rest of your organs. This can cause weakness, light-headedness, a rapid heartbeat, and fainting. What are the causes? This condition may be caused by: Blood loss. Loss of body fluids (dehydration). Heart problems. Hormone (endocrine) problems. Pregnancy. Severe infection. Lack of certain nutrients. Severe allergic reactions (anaphylaxis). Certain medicines, such as blood pressure medicine or medicines that make the body lose excess fluids (diuretics). Sometimes, hypotension may be caused by not taking medicine as directed, such as taking too much of a certain medicine. What increases the risk? The following factors may make you more likely to develop this condition: Age. Risk increases as you get older. Having a condition that affects the heart or the central nervous system. What are the signs or symptoms? Common symptoms of this condition include: Weakness. Light-headedness. Dizziness. Blurred vision. Tiredness (fatigue). Rapid heartbeat. Fainting, in severe cases. How is this diagnosed? This condition is diagnosed based on: Your medical history. Your symptoms. Your blood pressure measurement. Your health care provider will check your blood pressure when you are: Lying down. Sitting. Standing. A blood pressure reading is recorded as two numbers, such as 120 over 80 (or 120/80). The first (top) number is called the systolic pressure. It is a measure of the pressure in your arteries as your heart beats. The second (bottom) number is called the diastolic pressure. It is a measure of the pressure in your arteries when your heart relaxes between beats. Blood pressure is measured in a unit called mm Hg. Healthy blood pressure for most adults is 120/80. If your blood pressure is below  90/60, you may be diagnosed with hypotension. Other information or tests that may be used to diagnose hypotension include: Your other vital signs, such as your heart rate and temperature. Blood tests. Tilt table test. For this test, you will be safely secured to a table that moves you from a lying position to an upright position. Your heart rhythm and blood pressure will be monitored during the test. How is this treated? Treatment for this condition may include: Changing your diet. This may involve drinking more water or increasing your salt (sodium) intake with high-sodium foods. Taking medicines to raise your blood pressure. Changing the dosage of certain medicines you are taking that might be lowering your blood pressure. Wearing compression stockings. These stockings help to prevent blood clots and reduce swelling in your legs. In some cases, you may need to go to the hospital for: Fluid replacement. This means you will receive fluids through an IV. Blood replacement. This means you will receive donated blood through an IV (transfusion). Treating an infection or heart problems, if this applies. Monitoring. You may need to be monitored while medicines that you are taking wear off.  Follow these instructions at home: Eating and drinking  Drink enough fluid to keep your urine pale yellow. Eat a healthy diet, and follow instructions from your health care provider about eating or drinking restrictions. A healthy diet includes: Fresh fruits and vegetables. Whole grains. Lean meats. Low-fat dairy products. Increase your salt intake if told to do so. Do not add extra salt to your diet unless your health care provider tells you to do that. Eat frequent, small meals. Avoid standing up suddenly after eating. Medicines Take over-the-counter and prescription medicines only as told by your health care provider. Follow instructions from your health care provider about changing the dosage of your  current medicines, if this applies. Do not stop or adjust any of your medicines on your own. General instructions  Wear compression stockings as told by your health care provider. Get up slowly from lying down or sitting positions. This gives your blood pressure a chance to adjust. Avoid hot showers and excessive heat as directed by your health care provider. Return to your normal activities as told by your health care provider. Ask your health care provider what activities are safe for you. Do not use any products that contain nicotine or tobacco. These products include cigarettes, chewing tobacco, and vaping devices, such as e-cigarettes. If you need help quitting, ask your health care provider. Keep all follow-up visits. This is important. Contact a health care provider if: You vomit. You have diarrhea. You have a fever for more than 2-3 days. You feel more thirsty than usual. You feel weak and tired. Get help right away if: You have chest pain. You have a fast or irregular heartbeat. You develop numbness in any part of your body. You cannot move your arms or your legs. You have trouble speaking. You become sweaty or feel light-headed. You faint. You feel short of breath. You have trouble staying awake. You feel confused. These symptoms may be an emergency. Get help right away. Call 911. Do not wait to see if the symptoms will go away. Do not drive yourself to the hospital. Summary Hypotension is when the force of blood pumping through the arteries is too weak. Hypotension may be harmless (benign) or may cause serious problems (be critical). Treatment for this condition may include changing your diet, changing your medicines, and wearing compression stockings. In some cases, you may need to go to the hospital for fluid or blood replacement. This information is not intended to replace advice given to you by your health care provider. Make sure you discuss any questions you have  with your health care provider. Document Revised: 03/15/2021 Document Reviewed: 03/15/2021 Elsevier Patient Education  2024 [employer]   If you have been instructed to have an in-person evaluation today at a local Urgent Care facility, please use the link below. It will take you to a list of all of our available [facility], including address, phone number and hours of operation. Please do not delay care.  [facility]  If you or a family member do not have a primary care provider, use the link below to schedule a visit and establish care. When you choose a [facility] primary care physician or advanced practice provider, you gain a long-term partner in health. Find a Primary Care Provider  Learn more about [facility]'s in-office and virtual care options: [facility] Get Care Now

## 2024-02-02 NOTE — Telephone Encounter (Signed)
 Pt called back  regarding bp medications- Shared providers' note:  Norleen Lynwood ORN, MD  Physician Internal Medicine   Telephone Encounter Signed   Encounter Date: 01/24/2024   Signed      Ok to let pt know - these BP are too low, so please take only HALF of the micardis  80 mg per day (so only take 40 mg instead of the 80 mg), cont to follow the Bps and call if they are still low by Monday 6/30        Electronically signed by Norleen Lynwood ORN, MD at 02/02/2024  3:24 PM    Pt does not know if she should take the other BP medications prescribed.   Pt has scheduled a MyChart Vv to address these questions.          Copied from CRM [id number]. Topic: Clinical - Red Word Triage >> Feb 02, 2024  5:01 PM Berneda FALCON wrote: Red Word that prompted transfer to Nurse Triage: Pt was triaged earlier for low bp but states she never received any updated from PCP on how to proceed or if she should take her medication or not. Was instructed to NT again Answer Assessment - Initial Assessment Questions 1. REASON FOR CALL or QUESTION: What is your reason for calling today? or How can I best help you? or What question do you have that I can help answer?     Pt is unsure if she should take her other BP medications  Protocols used: Information Only Call - No Triage-A-AH

## 2024-02-02 NOTE — Telephone Encounter (Signed)
 Ok to let pt know - these BP are too low, so please take only HALF of the micardis  80 mg per day (so only take 40 mg instead of the 80 mg), cont to follow the Bps and call if they are still low by Monday 6/30

## 2024-02-02 NOTE — Telephone Encounter (Signed)
 Same answer   thanks

## 2024-02-04 ENCOUNTER — Ambulatory Visit
Admission: RE | Admit: 2024-02-04 | Discharge: 2024-02-04 | Disposition: A | Discharge status: Home / Self Care | Source: Ambulatory Visit | Attending: Physician Assistant | Admitting: Physician Assistant

## 2024-02-04 ENCOUNTER — Other Ambulatory Visit: Payer: Self-pay

## 2024-02-04 VITALS — BP 106/68 | HR 81 | Temp 98.4°F | Resp 18 | Ht 64.0 in | Wt 185.0 lb

## 2024-02-04 DIAGNOSIS — R0989 Other specified symptoms and signs involving the circulatory and respiratory systems: Secondary | ICD-10-CM | POA: Diagnosis not present

## 2024-02-04 DIAGNOSIS — I952 Hypotension due to drugs: Secondary | ICD-10-CM | POA: Diagnosis not present

## 2024-02-04 DIAGNOSIS — G4452 New daily persistent headache (NDPH): Secondary | ICD-10-CM

## 2024-02-04 NOTE — Discharge Instructions (Addendum)
 Your EKG was reassuring today. At this time I recommend the following changes to your medications until you can be evaluated by your PCP and discuss any ongoing medications changes that seem appropriate: Please take the Telmisartan  40 mg once per day Discontinue the Amlodipine , Hydrochlorothiazide , and metoprolol  for now Continue to check your blood pressure 1-2 times per day for monitoring and take the results with you to your PCP apt to review with them.  If you have increased chest pain, pressure or tightness, trouble breathing, severe headaches, dizziness or lose consciousness please go to the ED or call 911 for assistance.   It was nice to meet you and I appreciate the opportunity to be involved in your care If you were satisfied with the care you received from our team, I would greatly appreciate you saying so in the after-visit survey that is sent out following our visit.

## 2024-02-04 NOTE — ED Triage Notes (Addendum)
 Pt presents with complaints of hypotension for about one week. States she is currently on three blood pressure medications. Have not taken any of them since Friday evening, 6/27. Reports she is feeling woozy and having constant headaches that do not go away. Currently rates her overall headache pain a 3/10. OTC Tylenol  taken with no improvement.
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
## 2024-02-05 ENCOUNTER — Ambulatory Visit: Payer: Self-pay

## 2024-02-05 NOTE — Telephone Encounter (Signed)
 Copied from CRM [id number]. Topic: Clinical - Red Word Triage >> Feb 05, 2024  3:51 PM Berneda FALCON wrote: Red Word that prompted transfer to Nurse Triage: Low blood pressure This morning was 67/67 and just now was 89/64.  Pt was instructed by PCP to only take half a pill is blood pressure was low, which she did. Also instructed to callback if blood pressure is still low, and it is.  Pulse is 68    FYI Only or Action Required?: FYI only for provider.  Patient was last seen in primary care on 10/31/2023 by Norleen Lynwood ORN, MD. Called Nurse Triage reporting Low Blood Pressure. Symptoms began several days ago. Interventions attempted: Other: Seen at urgent care. Symptoms are: unchanged.  Triage Disposition: See Physician Within 24 Hours  Patient/caregiver understands and will follow disposition?: Yes    Reason for Disposition  [1] Systolic BP 90-110 AND [2] taking blood pressure medications AND [3] NOT dizzy, lightheaded or weak    BP is 89, seen in UC and office aware of low blood pressures. Appointment scheduled for tomorrow.  Additional Information  Negative: [1] Systolic BP < 90 AND [2] NOT dizzy, lightheaded or weak    BP is 89, seen in UC and office aware of low blood pressures. Appointment scheduled for tomorrow.  Answer Assessment - Initial Assessment Questions 1. BLOOD PRESSURE: What is the blood pressure? Did you take at least two measurements 5 minutes apart?     97/67 this morning, now 89/64 2. ONSET: When did you take your blood pressure?     Prior to calling  3. HOW: How did you obtain the blood pressure? (e.g., visiting nurse, automatic home BP monitor)     Automatic BP cuff 4. HISTORY: Do you have a history of low blood pressure? What is your blood pressure normally?     No 5. MEDICINES: Are you taking any medications for blood pressure? If Yes, ask: Have they been changed recently?     Takes blood pressure medication  6. PULSE RATE: Do you know what your  pulse rate is?      68 7. OTHER SYMPTOMS: Have you been sick recently? Have you had a recent injury?     Headache  Protocols used: Blood Pressure - Low-A-AH

## 2024-02-06 ENCOUNTER — Ambulatory Visit (INDEPENDENT_AMBULATORY_CARE_PROVIDER_SITE_OTHER): Admitting: Internal Medicine

## 2024-02-06 VITALS — BP 100/68 | HR 60 | Temp 98.0°F | Ht 64.0 in | Wt 192.0 lb

## 2024-02-06 DIAGNOSIS — I10 Essential (primary) hypertension: Secondary | ICD-10-CM

## 2024-02-06 DIAGNOSIS — R519 Headache, unspecified: Secondary | ICD-10-CM

## 2024-02-06 DIAGNOSIS — Z7985 Long-term (current) use of injectable non-insulin antidiabetic drugs: Secondary | ICD-10-CM | POA: Diagnosis not present

## 2024-02-06 DIAGNOSIS — E559 Vitamin D deficiency, unspecified: Secondary | ICD-10-CM

## 2024-02-06 DIAGNOSIS — E1165 Type 2 diabetes mellitus with hyperglycemia: Secondary | ICD-10-CM

## 2024-02-06 DIAGNOSIS — E538 Deficiency of other specified B group vitamins: Secondary | ICD-10-CM

## 2024-02-06 MED ORDER — KETOROLAC TROMETHAMINE 30 MG/ML IJ SOLN
30.0000 mg | Freq: Once | INTRAMUSCULAR | Status: AC
  Administered 2024-02-06: 30 mg via INTRAMUSCULAR

## 2024-02-06 MED ORDER — SUMATRIPTAN SUCCINATE 100 MG PO TABS: 100.0000 mg | ORAL_TABLET | ORAL | 5 refills | Status: DC | PRN

## 2024-02-06 NOTE — Assessment & Plan Note (Signed)
 Lab Results  Component Value Date   VITAMINB12 267 10/04/2023   Stable, cont oral replacement - b12 1000 mcg qd

## 2024-02-06 NOTE — Assessment & Plan Note (Signed)
 Last vitamin D Lab Results  Component Value Date   VD25OH 55.18 10/04/2023   Stable, cont oral replacement
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
## 2024-02-06 NOTE — Assessment & Plan Note (Signed)
 With recent wt loss now overcontrolled - pt to d/c all meds for now, and continue to monitor BPA at home, with goal to stay < 130/80

## 2024-02-06 NOTE — Assessment & Plan Note (Signed)
 Lab Results  Component Value Date   HGBA1C 5.4 10/04/2023   Stable, pt to continue current medical treatment mounjaro  12.5 mg weekly as has just started

## 2024-02-06 NOTE — Patient Instructions (Addendum)
 Ok to stop the micardis , fluid pill, metoprolol , and amlodipine  completely  Please continue to monitor the BP at home as you do, with the goal of staying under 130/80  You had the Toradol 30 mg shot today for pain  Please take all new medication as prescribed- - the imitrex as needed  Please continue all other medications as before  Please have the pharmacy call with any other refills you may need.  Please keep your appointments with your specialists as you may have planned

## 2024-02-06 NOTE — Assessment & Plan Note (Signed)
 C/w probable migraine - for toradol 30 mg IM, also imitrex prn,  to f/u any worsening symptoms or concerns

## 2024-03-16 ENCOUNTER — Other Ambulatory Visit: Payer: Self-pay | Admitting: Internal Medicine

## 2024-04-17 ENCOUNTER — Encounter: Payer: Self-pay | Admitting: Internal Medicine

## 2024-04-18 MED ORDER — COVID-19 MRNA VACC (MODERNA) 50 MCG/0.5ML IM SUSP: 0.5000 mL | Freq: Once | INTRAMUSCULAR | 0 refills | Status: AC

## 2024-04-24 ENCOUNTER — Encounter: Payer: Self-pay | Admitting: Pharmacist

## 2024-04-24 NOTE — Progress Notes (Signed)
 Pharmacy Quality Measure Review  This patient is appearing on a report for being at risk of failing the adherence measure for hypertension (ACEi/ARB) medications this calendar year.   Medication: Telmisartan  Last fill date: 11/13/23 for 90 day supply  Medication has been discontinued due to hypotension. No action needed.  Darrelyn Drum, PharmD, BCPS, CPP Clinical Pharmacist Practitioner [facility] [phone]

## 2024-05-02 ENCOUNTER — Ambulatory Visit: Payer: Self-pay | Admitting: Internal Medicine

## 2024-05-02 ENCOUNTER — Encounter: Payer: Self-pay | Admitting: Internal Medicine

## 2024-05-02 ENCOUNTER — Ambulatory Visit (INDEPENDENT_AMBULATORY_CARE_PROVIDER_SITE_OTHER): Admitting: Internal Medicine

## 2024-05-02 VITALS — BP 152/94 | HR 76 | Temp 98.5°F | Ht 64.0 in | Wt 185.0 lb

## 2024-05-02 DIAGNOSIS — E559 Vitamin D deficiency, unspecified: Secondary | ICD-10-CM | POA: Diagnosis not present

## 2024-05-02 DIAGNOSIS — I10 Essential (primary) hypertension: Secondary | ICD-10-CM | POA: Diagnosis not present

## 2024-05-02 DIAGNOSIS — Z7985 Long-term (current) use of injectable non-insulin antidiabetic drugs: Secondary | ICD-10-CM | POA: Diagnosis not present

## 2024-05-02 DIAGNOSIS — E1165 Type 2 diabetes mellitus with hyperglycemia: Secondary | ICD-10-CM

## 2024-05-02 DIAGNOSIS — E538 Deficiency of other specified B group vitamins: Secondary | ICD-10-CM | POA: Diagnosis not present

## 2024-05-02 DIAGNOSIS — R109 Unspecified abdominal pain: Secondary | ICD-10-CM

## 2024-05-02 LAB — BASIC METABOLIC PANEL WITH GFR
BUN: 15 mg/dL (ref 6–23)
CO2: 28 meq/L (ref 19–32)
Calcium: 9.3 mg/dL (ref 8.4–10.5)
Chloride: 103 meq/L (ref 96–112)
Creatinine, Ser: 0.99 mg/dL (ref 0.40–1.20)
GFR: 59.12 mL/min — ABNORMAL LOW (ref >60.00)
Glucose, Bld: 82 mg/dL (ref 70–99)
Potassium: 3.7 meq/L (ref 3.5–5.1)
Sodium: 138 meq/L (ref 135–145)

## 2024-05-02 LAB — CBC WITH DIFFERENTIAL/PLATELET
Basophils Absolute: 0 K/uL (ref 0.0–0.1)
Basophils Relative: 0.6 % (ref 0.0–3.0)
Eosinophils Absolute: 0.1 K/uL (ref 0.0–0.7)
Eosinophils Relative: 1.7 % (ref 0.0–5.0)
HCT: 34.8 % — ABNORMAL LOW (ref 36.0–46.0)
Hemoglobin: 10.9 g/dL — ABNORMAL LOW (ref 12.0–15.0)
Lymphocytes Relative: 45.8 % (ref 12.0–46.0)
Lymphs Abs: 1.7 K/uL (ref 0.7–4.0)
MCHC: 31.2 g/dL (ref 30.0–36.0)
MCV: 75 fl — ABNORMAL LOW (ref 78.0–100.0)
Monocytes Absolute: 0.3 K/uL (ref 0.1–1.0)
Monocytes Relative: 7.8 % (ref 3.0–12.0)
Neutro Abs: 1.7 K/uL (ref 1.4–7.7)
Neutrophils Relative %: 44.1 % (ref 43.0–77.0)
Platelets: 211 K/uL (ref 150.0–400.0)
RBC: 4.65 Mil/uL (ref 3.87–5.11)
RDW: 14.7 % (ref 11.5–15.5)
WBC: 3.8 K/uL — ABNORMAL LOW (ref 4.0–10.5)

## 2024-05-02 LAB — URINALYSIS, ROUTINE W REFLEX MICROSCOPIC
Bilirubin Urine: NEGATIVE
Hgb urine dipstick: NEGATIVE
Ketones, ur: NEGATIVE
Leukocytes,Ua: NEGATIVE
Nitrite: NEGATIVE
RBC / HPF: NONE SEEN (ref 0–?)
Specific Gravity, Urine: 1.015 (ref 1.000–1.030)
Total Protein, Urine: NEGATIVE
Urine Glucose: NEGATIVE
Urobilinogen, UA: 0.2 (ref 0.0–1.0)
WBC, UA: NONE SEEN (ref 0–?)
pH: 6 (ref 5.0–8.0)

## 2024-05-02 LAB — HEPATIC FUNCTION PANEL
ALT: 15 U/L (ref 0–35)
AST: 20 U/L (ref 0–37)
Albumin: 4 g/dL (ref 3.5–5.2)
Alkaline Phosphatase: 39 U/L (ref 39–117)
Bilirubin, Direct: 0.2 mg/dL (ref 0.0–0.3)
Total Bilirubin: 0.5 mg/dL (ref 0.2–1.2)
Total Protein: 7 g/dL (ref 6.0–8.3)

## 2024-05-02 MED ORDER — AMLODIPINE BESYLATE 2.5 MG PO TABS: 2.5000 mg | ORAL_TABLET | Freq: Every day | ORAL | 3 refills | Status: DC

## 2024-05-02 NOTE — Assessment & Plan Note (Signed)
 Last vitamin D Lab Results  Component Value Date   VD25OH 55.18 10/04/2023   Stable, cont oral replacement

## 2024-05-02 NOTE — Assessment & Plan Note (Signed)
 Lab Results  Component Value Date   VITAMINB12 267 10/04/2023   Low, to start oral replacement - b12 1000 mcg qd

## 2024-05-02 NOTE — Assessment & Plan Note (Signed)
 Lab Results  Component Value Date   HGBA1C 5.4 10/04/2023   Stable, pt to continue current medical treatment mounjaro  12.5 mg weekly for now

## 2024-05-02 NOTE — Assessment & Plan Note (Signed)
 With hx of renal stone; for UA, cbc, bmp and CT renal to assess r/o recurrent stone; consider T spine MRI if Ct renal negative

## 2024-05-02 NOTE — Assessment & Plan Note (Signed)
 BP Readings from Last 3 Encounters:  05/02/24 (!) 152/94  02/06/24 100/68  02/04/24 106/68   unocntrolled, pt to restart but lower dose amlodipine  2.5 mg daily, with nurse visit in 2 wks to recheck

## 2024-05-02 NOTE — Progress Notes (Signed)
 The test results show that your current treatment is OK, as the tests are stable.  Please continue the same plan.  There is no other need for change of treatment or further evaluation based on these results, at this time.  thanks
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
## 2024-05-02 NOTE — Patient Instructions (Addendum)
 Ok to restart the amlodipine  at lower dose 2.5 mg per day  Please make Nurse Visit appt in 2 weeks for BP recheck  Please continue all other medications as before, and refills have been done if requested.  Please have the pharmacy call with any other refills you may need.  Please keep your appointments with your specialists as you may have planned  You will be contacted regarding the referral for: CT renal  Please go to the LAB at the blood drawing area for the tests to be done  You will be contacted by phone if any changes need to be made immediately.  Otherwise, you will receive a letter about your results with an explanation, but please check with MyChart first.  Please make an Appointment to return in 6 months, or sooner if needed

## 2024-05-03 ENCOUNTER — Ambulatory Visit (HOSPITAL_COMMUNITY)
Admission: RE | Admit: 2024-05-03 | Discharge: 2024-05-03 | Disposition: A | Discharge status: Home / Self Care | Source: Ambulatory Visit | Attending: Internal Medicine | Admitting: Internal Medicine

## 2024-05-03 DIAGNOSIS — K5730 Diverticulosis of large intestine without perforation or abscess without bleeding: Secondary | ICD-10-CM | POA: Insufficient documentation

## 2024-05-03 DIAGNOSIS — K429 Umbilical hernia without obstruction or gangrene: Secondary | ICD-10-CM | POA: Diagnosis not present

## 2024-05-03 DIAGNOSIS — N200 Calculus of kidney: Secondary | ICD-10-CM | POA: Diagnosis not present

## 2024-05-03 DIAGNOSIS — R109 Unspecified abdominal pain: Secondary | ICD-10-CM | POA: Insufficient documentation

## 2024-05-07 MED ORDER — AMLODIPINE BESYLATE 5 MG PO TABS: 5.0000 mg | ORAL_TABLET | Freq: Every day | ORAL | 3 refills | Status: DC

## 2024-05-07 MED ORDER — AMLODIPINE BESYLATE 10 MG PO TABS: 10.0000 mg | ORAL_TABLET | Freq: Every day | ORAL | 3 refills | Status: AC

## 2024-05-07 NOTE — Addendum Note (Signed)
 Addended by: NORLEEN LYNWOOD ORN on: 05/07/2024 04:52 PM   Modules accepted: Orders

## 2024-05-16 ENCOUNTER — Ambulatory Visit

## 2024-06-13 ENCOUNTER — Other Ambulatory Visit (HOSPITAL_BASED_OUTPATIENT_CLINIC_OR_DEPARTMENT_OTHER)

## 2024-06-14 ENCOUNTER — Other Ambulatory Visit

## 2024-07-09 ENCOUNTER — Telehealth: Payer: Self-pay | Admitting: Pharmacy Technician

## 2024-07-09 ENCOUNTER — Other Ambulatory Visit (HOSPITAL_COMMUNITY): Payer: Self-pay

## 2024-07-09 NOTE — Telephone Encounter (Signed)
 Pharmacy Patient Advocate Encounter   Received notification from CoverMyMeds that prior authorization for Mounjaro  12.5MG /0.5ML auto-injectors  is required/requested.   Insurance verification completed.   The patient is insured through [facility] ADVANTAGE/RX ADVANCE.   Per test claim: PA required and submitted KEY/EOC/Request #: [id number] from 07/09/24 to 07/09/25. Ran test claim, Copay is $0.00. This test claim was processed through [facility]- copay amounts may vary at other pharmacies due to pharmacy/plan contracts, or as the patient moves through the different stages of their insurance plan.

## 2024-07-10 ENCOUNTER — Other Ambulatory Visit (HOSPITAL_COMMUNITY): Payer: Self-pay

## 2024-08-07 ENCOUNTER — Encounter: Payer: Self-pay | Admitting: Internal Medicine

## 2024-08-07 ENCOUNTER — Ambulatory Visit: Admitting: Internal Medicine

## 2024-08-07 VITALS — BP 122/76 | HR 79 | Temp 98.4°F | Ht 64.0 in | Wt 182.0 lb

## 2024-08-07 DIAGNOSIS — E559 Vitamin D deficiency, unspecified: Secondary | ICD-10-CM | POA: Diagnosis not present

## 2024-08-07 DIAGNOSIS — R062 Wheezing: Secondary | ICD-10-CM | POA: Diagnosis not present

## 2024-08-07 DIAGNOSIS — R051 Acute cough: Secondary | ICD-10-CM | POA: Diagnosis not present

## 2024-08-07 DIAGNOSIS — R6889 Other general symptoms and signs: Secondary | ICD-10-CM

## 2024-08-07 DIAGNOSIS — E538 Deficiency of other specified B group vitamins: Secondary | ICD-10-CM | POA: Diagnosis not present

## 2024-08-07 LAB — POCT INFLUENZA A/B
Influenza A, POC: NEGATIVE
Influenza B, POC: NEGATIVE

## 2024-08-07 MED ORDER — PREDNISONE 10 MG PO TABS: ORAL_TABLET | ORAL | 0 refills | Status: AC

## 2024-08-07 MED ORDER — HYDROCODONE BIT-HOMATROP MBR 5-1.5 MG/5ML PO SOLN: 5.0000 mL | Freq: Four times a day (QID) | ORAL | 0 refills | Status: AC | PRN

## 2024-08-07 MED ORDER — AZITHROMYCIN 250 MG PO TABS: ORAL_TABLET | ORAL | 1 refills | Status: AC

## 2024-08-07 MED ORDER — ALBUTEROL SULFATE HFA 108 (90 BASE) MCG/ACT IN AERS: 2.0000 | INHALATION_SPRAY | Freq: Four times a day (QID) | RESPIRATORY_TRACT | 1 refills | Status: AC | PRN

## 2024-08-07 NOTE — Assessment & Plan Note (Signed)
 Lab Results  Component Value Date   VITAMINB12 267 10/04/2023   Low, to start oral replacement - b12 1000 mcg qd
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
## 2024-08-07 NOTE — Patient Instructions (Signed)
 Your COVID and Flu testing is negative  Please take all new medication as prescribed - the antibiotic, cough medicine, prednisone  and inhaler as needed  Please continue all other medications as before, and refills have been done if requested.  Please have the pharmacy call with any other refills you may need.  Please keep your appointments with your specialists as you may have planned

## 2024-08-07 NOTE — Assessment & Plan Note (Signed)
 Mild to mod, for prednisone  taper, and albuterol  hfa prn, to f/u any worsening symptoms or concerns

## 2024-08-07 NOTE — Assessment & Plan Note (Signed)
 Last vitamin D Lab Results  Component Value Date   VD25OH 55.18 10/04/2023   Stable, cont oral replacement

## 2024-08-07 NOTE — Assessment & Plan Note (Signed)
 Mild to mod, c/w  bronchitis vs pna, declines cxr,  for antibx course zpack, cough med prn,  to f/u any worsening symptoms or concerns

## 2024-08-15 ENCOUNTER — Ambulatory Visit: Payer: Self-pay | Admitting: Internal Medicine

## 2024-08-15 ENCOUNTER — Ambulatory Visit (INDEPENDENT_AMBULATORY_CARE_PROVIDER_SITE_OTHER)
Admission: RE | Admit: 2024-08-15 | Discharge: 2024-08-15 | Disposition: A | Discharge status: Home / Self Care | Source: Ambulatory Visit | Attending: Internal Medicine | Admitting: Internal Medicine

## 2024-08-15 DIAGNOSIS — Z780 Asymptomatic menopausal state: Secondary | ICD-10-CM | POA: Diagnosis not present

## 2024-08-23 ENCOUNTER — Telehealth: Payer: Self-pay | Admitting: Pharmacy Technician

## 2024-08-23 NOTE — Progress Notes (Signed)
" ° °  08/23/2024  Patient ID: Elizabeth Hawkins, female   DOB: 1955-10-10, 69 y.o.   MRN: [id number]  Pharmacy Quality Measure Review  This patient failed  the 2025 adherence measure for hypertension (ACEi/ARB) medications this calendar year.   Medication: Telmisartan  Last fill date: 11/15/2023 for 90 day supply  Chart review performed in EPIC. Medication was discontinued by PCP on 02/06/24. Xanthe Couillard, CPhT [facility] Office: [phone] Email: [email or web address]   "

## 2024-10-30 ENCOUNTER — Ambulatory Visit: Admitting: Internal Medicine

## 2024-12-17 ENCOUNTER — Ambulatory Visit: Admitting: Family Medicine
